# Patient Record
Sex: Female | Born: 1950
Health system: Southern US, Community
[De-identification: ages and names within clinical notes are randomized; demographics above are authoritative.]

## PROBLEM LIST (undated history)

## (undated) DIAGNOSIS — J329 Chronic sinusitis, unspecified: Secondary | ICD-10-CM

## (undated) DIAGNOSIS — N951 Menopausal and female climacteric states: Secondary | ICD-10-CM

## (undated) DIAGNOSIS — G43109 Migraine with aura, not intractable, without status migrainosus: Secondary | ICD-10-CM

## (undated) DIAGNOSIS — Z8489 Family history of other specified conditions: Secondary | ICD-10-CM

## (undated) DIAGNOSIS — Z862 Personal history of diseases of the blood and blood-forming organs and certain disorders involving the immune mechanism: Secondary | ICD-10-CM

## (undated) DIAGNOSIS — Z8742 Personal history of other diseases of the female genital tract: Secondary | ICD-10-CM

## (undated) DIAGNOSIS — Z8701 Personal history of pneumonia (recurrent): Secondary | ICD-10-CM

## (undated) DIAGNOSIS — H6090 Unspecified otitis externa, unspecified ear: Secondary | ICD-10-CM

## (undated) DIAGNOSIS — E871 Hypo-osmolality and hyponatremia: Secondary | ICD-10-CM

## (undated) DIAGNOSIS — U071 COVID-19: Secondary | ICD-10-CM

## (undated) DIAGNOSIS — J320 Chronic maxillary sinusitis: Secondary | ICD-10-CM

## (undated) DIAGNOSIS — N809 Endometriosis, unspecified: Secondary | ICD-10-CM

## (undated) DIAGNOSIS — R001 Bradycardia, unspecified: Secondary | ICD-10-CM

## (undated) DIAGNOSIS — R202 Paresthesia of skin: Secondary | ICD-10-CM

## (undated) DIAGNOSIS — H9192 Unspecified hearing loss, left ear: Secondary | ICD-10-CM

## (undated) DIAGNOSIS — J302 Other seasonal allergic rhinitis: Secondary | ICD-10-CM

## (undated) DIAGNOSIS — I10 Essential (primary) hypertension: Secondary | ICD-10-CM

## (undated) DIAGNOSIS — Z889 Allergy status to unspecified drugs, medicaments and biological substances status: Secondary | ICD-10-CM

## (undated) DIAGNOSIS — R519 Headache, unspecified: Secondary | ICD-10-CM

## (undated) DIAGNOSIS — M8580 Other specified disorders of bone density and structure, unspecified site: Secondary | ICD-10-CM

## (undated) DIAGNOSIS — M1990 Unspecified osteoarthritis, unspecified site: Secondary | ICD-10-CM

## (undated) DIAGNOSIS — R55 Syncope and collapse: Secondary | ICD-10-CM

## (undated) DIAGNOSIS — Z8669 Personal history of other diseases of the nervous system and sense organs: Secondary | ICD-10-CM

## (undated) HISTORY — DX: Personal history of other diseases of the nervous system and sense organs: Z86.69

## (undated) HISTORY — DX: Unspecified otitis externa, unspecified ear: H60.90

## (undated) HISTORY — DX: Hypo-osmolality and hyponatremia: E87.1

## (undated) HISTORY — DX: COVID-19: U07.1

## (undated) HISTORY — DX: Personal history of other diseases of the female genital tract: Z87.42

## (undated) HISTORY — PX: APPENDECTOMY: SHX54

## (undated) HISTORY — DX: Bradycardia, unspecified: R00.1

## (undated) HISTORY — DX: Personal history of pneumonia (recurrent): Z87.01

## (undated) HISTORY — DX: Menopausal and female climacteric states: N95.1

## (undated) HISTORY — DX: Chronic maxillary sinusitis: J32.0

## (undated) HISTORY — DX: Syncope and collapse: R55

## (undated) HISTORY — PX: WISDOM TOOTH EXTRACTION: SHX21

## (undated) HISTORY — DX: Unspecified hearing loss, left ear: H91.92

## (undated) HISTORY — DX: Personal history of diseases of the blood and blood-forming organs and certain disorders involving the immune mechanism: Z86.2

## (undated) HISTORY — DX: Paresthesia of skin: R20.2

## (undated) HISTORY — DX: Migraine with aura, not intractable, without status migrainosus: G43.109

## (undated) HISTORY — DX: Other seasonal allergic rhinitis: J30.2

## (undated) HISTORY — DX: Other specified disorders of bone density and structure, unspecified site: M85.80

## (undated) HISTORY — DX: Endometriosis, unspecified: N80.9

---

## 1981-02-20 DIAGNOSIS — Z8701 Personal history of pneumonia (recurrent): Secondary | ICD-10-CM

## 1981-02-20 HISTORY — DX: Personal history of pneumonia (recurrent): Z87.01

## 1996-02-21 HISTORY — PX: TOTAL ABDOMINAL HYSTERECTOMY: SHX209

## 1997-08-18 ENCOUNTER — Ambulatory Visit (HOSPITAL_COMMUNITY): Admission: RE | Admit: 1997-08-18 | Discharge: 1997-08-18 | Payer: Self-pay | Admitting: Obstetrics and Gynecology

## 1997-09-28 ENCOUNTER — Other Ambulatory Visit: Admission: RE | Admit: 1997-09-28 | Discharge: 1997-09-28 | Payer: Self-pay | Admitting: Obstetrics and Gynecology

## 1998-09-08 ENCOUNTER — Encounter: Payer: Self-pay | Admitting: Obstetrics and Gynecology

## 1998-09-08 ENCOUNTER — Ambulatory Visit (HOSPITAL_COMMUNITY): Admission: RE | Admit: 1998-09-08 | Discharge: 1998-09-08 | Payer: Self-pay | Admitting: Obstetrics and Gynecology

## 1998-09-30 ENCOUNTER — Other Ambulatory Visit: Admission: RE | Admit: 1998-09-30 | Discharge: 1998-09-30 | Payer: Self-pay | Admitting: Obstetrics and Gynecology

## 1999-09-09 ENCOUNTER — Encounter: Payer: Self-pay | Admitting: Obstetrics and Gynecology

## 1999-09-09 ENCOUNTER — Ambulatory Visit (HOSPITAL_COMMUNITY): Admission: RE | Admit: 1999-09-09 | Discharge: 1999-09-09 | Payer: Self-pay | Admitting: Obstetrics and Gynecology

## 1999-10-10 ENCOUNTER — Other Ambulatory Visit: Admission: RE | Admit: 1999-10-10 | Discharge: 1999-10-10 | Payer: Self-pay | Admitting: Obstetrics and Gynecology

## 2000-02-21 HISTORY — PX: NASAL SINUS SURGERY: SHX719

## 2000-03-02 ENCOUNTER — Encounter (INDEPENDENT_AMBULATORY_CARE_PROVIDER_SITE_OTHER): Payer: Self-pay | Admitting: Specialist

## 2000-03-02 ENCOUNTER — Ambulatory Visit (HOSPITAL_BASED_OUTPATIENT_CLINIC_OR_DEPARTMENT_OTHER): Admission: RE | Admit: 2000-03-02 | Discharge: 2000-03-02 | Payer: Self-pay | Admitting: Otolaryngology

## 2000-09-13 ENCOUNTER — Ambulatory Visit (HOSPITAL_COMMUNITY): Admission: RE | Admit: 2000-09-13 | Discharge: 2000-09-13 | Payer: Self-pay | Admitting: Obstetrics and Gynecology

## 2000-09-13 ENCOUNTER — Encounter: Payer: Self-pay | Admitting: Obstetrics and Gynecology

## 2000-10-09 ENCOUNTER — Encounter (INDEPENDENT_AMBULATORY_CARE_PROVIDER_SITE_OTHER): Payer: Self-pay | Admitting: *Deleted

## 2000-10-09 ENCOUNTER — Other Ambulatory Visit: Admission: RE | Admit: 2000-10-09 | Discharge: 2000-10-09 | Payer: Self-pay | Admitting: Otolaryngology

## 2000-11-05 ENCOUNTER — Other Ambulatory Visit: Admission: RE | Admit: 2000-11-05 | Discharge: 2000-11-05 | Payer: Self-pay | Admitting: Obstetrics and Gynecology

## 2001-09-16 ENCOUNTER — Encounter: Payer: Self-pay | Admitting: Emergency Medicine

## 2001-09-16 ENCOUNTER — Observation Stay (HOSPITAL_COMMUNITY): Admission: EM | Admit: 2001-09-16 | Discharge: 2001-09-16 | Payer: Self-pay | Admitting: Emergency Medicine

## 2001-09-18 ENCOUNTER — Ambulatory Visit (HOSPITAL_COMMUNITY): Admission: RE | Admit: 2001-09-18 | Discharge: 2001-09-18 | Payer: Self-pay | Admitting: Obstetrics and Gynecology

## 2001-09-18 ENCOUNTER — Encounter: Payer: Self-pay | Admitting: Obstetrics and Gynecology

## 2002-09-30 ENCOUNTER — Ambulatory Visit (HOSPITAL_COMMUNITY): Admission: RE | Admit: 2002-09-30 | Discharge: 2002-09-30 | Payer: Self-pay | Admitting: Obstetrics and Gynecology

## 2002-09-30 ENCOUNTER — Encounter: Payer: Self-pay | Admitting: Obstetrics and Gynecology

## 2002-12-09 ENCOUNTER — Ambulatory Visit (HOSPITAL_COMMUNITY): Admission: RE | Admit: 2002-12-09 | Discharge: 2002-12-09 | Payer: Self-pay | Admitting: Neurology

## 2003-10-02 ENCOUNTER — Ambulatory Visit (HOSPITAL_COMMUNITY): Admission: RE | Admit: 2003-10-02 | Discharge: 2003-10-02 | Payer: Self-pay | Admitting: Obstetrics and Gynecology

## 2004-10-06 ENCOUNTER — Ambulatory Visit (HOSPITAL_COMMUNITY): Admission: RE | Admit: 2004-10-06 | Discharge: 2004-10-06 | Payer: Self-pay | Admitting: Obstetrics and Gynecology

## 2005-10-09 ENCOUNTER — Ambulatory Visit (HOSPITAL_COMMUNITY): Admission: RE | Admit: 2005-10-09 | Discharge: 2005-10-09 | Payer: Self-pay | Admitting: Obstetrics & Gynecology

## 2005-12-01 ENCOUNTER — Other Ambulatory Visit: Admission: RE | Admit: 2005-12-01 | Discharge: 2005-12-01 | Payer: Self-pay | Admitting: Obstetrics & Gynecology

## 2006-10-19 ENCOUNTER — Ambulatory Visit (HOSPITAL_COMMUNITY): Admission: RE | Admit: 2006-10-19 | Discharge: 2006-10-19 | Payer: Self-pay | Admitting: Obstetrics & Gynecology

## 2006-10-23 ENCOUNTER — Ambulatory Visit (HOSPITAL_COMMUNITY): Admission: RE | Admit: 2006-10-23 | Discharge: 2006-10-23 | Payer: Self-pay | Admitting: Obstetrics & Gynecology

## 2006-12-03 ENCOUNTER — Other Ambulatory Visit: Admission: RE | Admit: 2006-12-03 | Discharge: 2006-12-03 | Payer: Self-pay | Admitting: Obstetrics & Gynecology

## 2007-08-13 ENCOUNTER — Encounter: Admission: RE | Admit: 2007-08-13 | Discharge: 2007-08-13 | Payer: Self-pay | Admitting: Otolaryngology

## 2007-10-24 ENCOUNTER — Ambulatory Visit (HOSPITAL_COMMUNITY): Admission: RE | Admit: 2007-10-24 | Discharge: 2007-10-24 | Payer: Self-pay | Admitting: Obstetrics & Gynecology

## 2008-10-27 ENCOUNTER — Ambulatory Visit (HOSPITAL_COMMUNITY): Admission: RE | Admit: 2008-10-27 | Discharge: 2008-10-27 | Payer: Self-pay | Admitting: Obstetrics & Gynecology

## 2009-03-18 ENCOUNTER — Encounter: Admission: RE | Admit: 2009-03-18 | Discharge: 2009-03-18 | Payer: Self-pay | Admitting: Family Medicine

## 2009-10-28 ENCOUNTER — Ambulatory Visit (HOSPITAL_COMMUNITY): Admission: RE | Admit: 2009-10-28 | Discharge: 2009-10-28 | Payer: Self-pay | Admitting: Obstetrics & Gynecology

## 2010-03-21 ENCOUNTER — Other Ambulatory Visit: Payer: Self-pay | Admitting: Dermatology

## 2010-05-10 ENCOUNTER — Other Ambulatory Visit: Payer: Self-pay | Admitting: Specialist

## 2010-05-10 DIAGNOSIS — M545 Low back pain, unspecified: Secondary | ICD-10-CM

## 2010-05-13 ENCOUNTER — Ambulatory Visit
Admission: RE | Admit: 2010-05-13 | Discharge: 2010-05-13 | Disposition: A | Discharge status: Home / Self Care | Payer: BC Managed Care – PPO | Source: Ambulatory Visit | Attending: Specialist | Admitting: Specialist

## 2010-05-13 DIAGNOSIS — M545 Low back pain, unspecified: Secondary | ICD-10-CM

## 2010-07-08 NOTE — H&P (Signed)
Vicki Turner, Vicki Turner                           ACCOUNT NO.:  [id number]   MEDICAL RECORD NO.:  [id number]                   PATIENT TYPE:  OBV   LOCATION:  0343                                 FACILITY:  [facility]   PHYSICIAN:  Vikki Ports, M.D.            DATE OF BIRTH:  [birth year]   DATE OF ADMISSION:  09/15/2001  DATE OF DISCHARGE:  09/16/2001                                HISTORY & PHYSICAL   CHIEF COMPLAINT:  Altered mental status.   HISTORY OF PRESENT ILLNESS:  This is by the family since the patient does  not recall the event. This is a 60 year old female who had just finished  dinner at restaurant this evening with her family and felt ill on going to  the car to head home. At the car, she suddenly, became very pale, and would  not respond to her family. This lasted for about 10 seconds. They said that  they were talking at her, looking at her right in her face, and yet she did  not respond for about 10 seconds. She simply stared at them. This resolved  and then she became very weak and needed to be supported by her family or  she would have fallen to the ground. They took her to the emergency  department for evaluation because they were concerned and on the way there,  she vomited. She does not recall the staring  event and now by the time I am  called to see her, which is approximately 3-1/2 hours later, she simply  feels very tired but she denies any pain during this episode. She had no  loss of bowel or bladder dysfunction. She just feels somewhat weak and  tired, but she says it is past her bedtime. She has no other associated  symptoms.   SOCIAL HISTORY:  Occupation: She is an administration. Tobacco: None.  Ethanol is about one drink per day.   MEDICATIONS:  Nadolol, she believes it to be 20 mg a day and that is for  migraine prophylaxis. She is also on Premarin 0.625 mg a day, and Imitrex as  needed for migraine headaches.   PAST MEDICAL HISTORY:  1. She gets  migraines.  2. History of sinus surgery.  3. History of hysterectomy for something she thinks to be endometriosis.   FAMILY HISTORY:  Father died of an ulcer, mother died of bronchiectasis.   ALLERGIES:  None.   REVIEW OF SYSTEMS:  1. She wears contact lenses.  2.  Her typical blood pressure is about     110/80, she does not know what her typical pulse is. All other systems     are negative.   PHYSICAL EXAMINATION:  GENERAL:  A well-developed, well-nourished female in  no acute distress, alert and oriented x3.  VITAL SIGNS:  Pulse 60, respirations 16, temperature 96, blood pressure  95/61.  HEENT:  Head is [place]/AT. Eyes: PERRLA,  EOMI, lids are normal. Fundi were not  able to visualized. Ears: Canals and TMs are all normal. Nose and septum  normal. Mouth is normal.  NECK:  No JVD, no bruit.  LUNGS:  Clear.  CARDIOVASCULAR:  Regular rate without murmur.  ABDOMEN:  Soft without mass or tenderness.  EXTREMITIES:  Showed full range of motion without edema.  NEUROLOGIC:  Cranial nerves II-XII, DTR, motor, sensory, and cerebellar  examinations are normal.   LABORATORY AND ACCESSORY DATA:  EEG shows a sinus rhythm, rate of 51. CT of  head was read as negative.   CMET shows potassium of 4.1, sodium 138, glucose 76, BU 19, creatinine 0.9.  Calcium 9.1, ALT 14. Rest of CMET is on the chart. CBC shows a white count  of 8.4, hematocrit 38.9, platelets are 331. This breaks down to 40% lymphs,  49% neutrophil, 8% monocytes. Cardiac panel shows a CK total of 74, MB  fraction 0.8, troponin I is less than 0.01. Urine is 1.005. Specific gravity  pH 5.5, negative dipstick. PT/PTT is 12.6/26 with an INR 0.9.   ASSESSMENT AND PLAN:  Altered level of consciousness for a transient  episode. Doubt this is cardiac, but will admit and monitor. Consider a  neurological consult in the morning, but will leave this up to the  hospitalist after they see and review the chart and evaluate the patient.  Will  hold the Natalol at present. The patient believes her dose is 20 mg per  day but would confirm this with the patient's family prior to restarting.  Further disposition pending patient's course and the result of her  consultations while in the hospital.                                               Vikki Ports, M.D.    TEK/MEDQ  D:  09/16/2001  T:  09/18/2001  Job:  [id number]

## 2010-07-08 NOTE — Op Note (Signed)
[place]  Patient:    ELIYANA, PAGLIARO                        MRN: [id number] Proc. Date: 03/02/00 Adm. Date:  [id number] Attending:  Lucky Cowboy CC:         [facility], Nose and Throat  Dellis Anes. Idell Pickles, M.D.   Operative Report  PREOPERATIVE DIAGNOSIS:  Septal deviation with right-sided nasal obstruction, right inferior turbinate hypertrophy, chronic right maxillary, anterior ethmoid and frontal sinusitis.  PROCEDURE:  Septoplasty, right inferior turbinate reduction, right maxillary antrotomy, right anterior ethmoidectomy, right frontal recess exploration, InstaTrak guidance.  SURGEON:  Lucky Cowboy, M.D.  ANESTHESIA:  General endotracheal anesthesia.  ESTIMATED BLOOD LOSS:  40 cc.  SPECIMENS:  Ethmoid and maxillary sinus contents.  COMPLICATIONS:  None.  INDICATIONS:  This patient is a 60 year old female with a one year history of recurrent right-sided sinusitis.  She has been treated with approximately seven courses of antibiotic therapy, including prednisone.  There has been no improvement on a CT scan, which revealed complete opacification of the right maxillary sinus, right anterior ethmoid cells and the right frontal recess region.  For this reason, the above procedures were performed.  FINDINGS:  The patient was noted to have a right, mid and high septal deviation.  This was causing severe lateralization of the right middle turbinate.  There was severe mucosal edema in this region.  There was right-sided nasal obstruction, both related to the inferior turbinate as well as the septal deviation.  There was severe edema around the maxillary antrum at the natural ostia site.  There was pus in the right maxillary sinus.  There was severe edema of the mucosa in the anterior ethmoid cells.  The frontal recess was left patent and undisturbed after clearing cells anterior and inferior to this.  DESCRIPTION OF PROCEDURE:  The patient  was taken to the operating room and placed on the table in the supine position.  She was then placed under general endotracheal anesthesia and the table rotated counterclockwise approximately 125 degrees.  Instatrak guidance headgear was placed.  The face was prepped with Betadine and draped in the usual sterile fashion.  Each nasal cavity was decongested with Afrin on cottonoid sponges. At this point, the Cathren Harsh was calibrated.  The 0 degree Storz-Hopkins endoscope was then used to visualize the right and left nasal cavities.  The right nasal cavity revealed a mid and high primarily cartilaginous septal deviation.  There was a right-sided bony deviation as well.  This was causing lateralization of the middle turbinate with obstruction of the middle meatus.  For this reason, septoplasty was performed.  One percent lidocaine with 1:100,000 epinephrine was then used to inject the septum and nasal floors, bilaterally.  A left hemitransfixion incision was made using a #15-blade.  Mucoperichondrial flaps and mucoperiosteal flaps were elevated on the left side and the bony cartilaginous junction divided.  Approximately 1 cm of the posterior portion of the quadrangular cartilage was taken down using Therapist, nutritional.  A contralateral flap was then elevated.  Open Jansen-Middleton forceps were used to transect the septum high.  A strut was left approximately 1.5 cm along the dorsal surface.  A large anterior strut was also left for nasal support.  The posterior portion of the quadrangular cartilage and anterior portion of the bone were in perpendicular plate of the ethmoid was thus taken down.  The septum was then allowed to be  medialized.  Attention was turned to the sinus portion of the procedure.  The middle turbinate was fractured medially.  Lidocaine 1% with 1:100,000 epinephrine was then used to inject the right middle uncinate process and middle turbinate. Pediatric backbiting forceps were  then used to take down the mid portion of the uncinate process, which was then taken down inferiorly.  The natural ostia was identified and enlarged using the microdebrider on an oscillating mode at 1500 rpm.  Tru-Cut forceps were also used to open up the natural ostia.  Pus was suctioned out.  At this point, the uncinate process was also taken down superiorly using the microdebrider.  The Instatrak guidance was then used to confirm position.  The ethmoid bullae was entered.  The lateral portion was very hard.  The medial portion was taken down using the microdebrider.  A small portion of the ground lamella was taken down posteriorly to ensure complete clearance of all anterior ethmoid air cells and a small portion of the posterior portion removed.  After identifying this location posteriorly, the cells were taken down superiorly in an anterior direction.  The agger nasi cells were then taken down, which opened up the region of the frontal recess. There was severe edema in this area.  However, the frontal recess was not edematous.  At this point, Gelfilm was rolled and placed into the right ethmoid cavity and Bactroban ointment placed in the right maxillary sinus.  Inferior turbinate was then reduced by injecting with 1% lidocaine with 1:100,000 of epinephrine.  It was then medialized.  The microdebrider was used to reduce redundant mucosa off the inferior portion of the turbinate.  Sinus scissors were then used to resect bone.  Suction/cautery was used for hemostasis.  The left hemitransfixion incision was closed in a simple interrupted fashion using 4-0 chromic.  Doyle splints were placed on either side of the septum and secured in a horizontal mattress stitch using a 2-0 silk suture.  Merocel packs were placed in each nasal cavity, which had been coated with Bactroban ointment.  They were tied anterior to the columella to one another.  Oral cavity was then suctioned.  Table was then  rotated 125 degrees to its original  position.  The patient was awakened from anesthesia and extubated in the operating room.  She was taken to the post anesthesia care unit in stable condition.  There were no complications. DD:  03/02/00 TD:  03/02/00 Job: [id number]

## 2010-10-05 ENCOUNTER — Other Ambulatory Visit: Payer: Self-pay | Admitting: Obstetrics & Gynecology

## 2010-10-05 DIAGNOSIS — M8580 Other specified disorders of bone density and structure, unspecified site: Secondary | ICD-10-CM

## 2010-10-05 DIAGNOSIS — Z1231 Encounter for screening mammogram for malignant neoplasm of breast: Secondary | ICD-10-CM

## 2010-11-01 ENCOUNTER — Ambulatory Visit (HOSPITAL_COMMUNITY)
Admission: RE | Admit: 2010-11-01 | Discharge: 2010-11-01 | Disposition: A | Discharge status: Home / Self Care | Payer: BC Managed Care – PPO | Source: Ambulatory Visit | Attending: Obstetrics & Gynecology | Admitting: Obstetrics & Gynecology

## 2010-11-01 DIAGNOSIS — Z1231 Encounter for screening mammogram for malignant neoplasm of breast: Secondary | ICD-10-CM | POA: Insufficient documentation

## 2010-11-01 DIAGNOSIS — M8580 Other specified disorders of bone density and structure, unspecified site: Secondary | ICD-10-CM

## 2011-10-06 ENCOUNTER — Other Ambulatory Visit: Payer: Self-pay | Admitting: Obstetrics & Gynecology

## 2011-10-06 DIAGNOSIS — Z1231 Encounter for screening mammogram for malignant neoplasm of breast: Secondary | ICD-10-CM

## 2011-11-06 ENCOUNTER — Ambulatory Visit (HOSPITAL_COMMUNITY)
Admission: RE | Admit: 2011-11-06 | Discharge: 2011-11-06 | Disposition: A | Discharge status: Home / Self Care | Payer: BC Managed Care – PPO | Source: Ambulatory Visit | Attending: Obstetrics & Gynecology | Admitting: Obstetrics & Gynecology

## 2011-11-06 DIAGNOSIS — Z1231 Encounter for screening mammogram for malignant neoplasm of breast: Secondary | ICD-10-CM | POA: Insufficient documentation

## 2012-05-06 ENCOUNTER — Encounter: Payer: Self-pay | Admitting: Obstetrics & Gynecology

## 2012-05-09 ENCOUNTER — Other Ambulatory Visit: Payer: Self-pay | Admitting: Dermatology

## 2012-05-21 ENCOUNTER — Encounter: Payer: Self-pay | Admitting: Obstetrics & Gynecology

## 2012-05-21 ENCOUNTER — Ambulatory Visit (INDEPENDENT_AMBULATORY_CARE_PROVIDER_SITE_OTHER): Payer: BC Managed Care – PPO | Admitting: Obstetrics & Gynecology

## 2012-05-21 VITALS — BP 128/82 | Ht 66.0 in | Wt 159.0 lb

## 2012-05-21 DIAGNOSIS — Z0000 Encounter for general adult medical examination without abnormal findings: Secondary | ICD-10-CM

## 2012-05-21 DIAGNOSIS — Z01419 Encounter for gynecological examination (general) (routine) without abnormal findings: Secondary | ICD-10-CM

## 2012-05-21 LAB — POCT URINALYSIS DIPSTICK
Blood, UA: NEGATIVE
Glucose, UA: NEGATIVE
pH, UA: 5

## 2012-05-21 MED ORDER — NADOLOL 20 MG PO TABS: 20.0000 mg | ORAL_TABLET | Freq: Every day | ORAL | Status: DC

## 2012-05-21 MED ORDER — ESTROGENS CONJUGATED 0.625 MG PO TABS: 0.6250 mg | ORAL_TABLET | Freq: Every day | ORAL | Status: DC

## 2012-05-21 NOTE — Progress Notes (Signed)
62 y.o. G2P2 MarriedCaucasianF here for annual exam.  Doing really well.  Has a new three week old granddaughter--Anna Windell Moulding.  Oldest daughter is expecting another child--son in July.  No vaginal bleeding.  D/W patient 3D MMG.   No LMP recorded. Patient has had a hysterectomy.          Sexually active: yes  The current method of family planning is status post hysterectomy.    Exercising: no  doing yardwork Smoker:  no  Health Maintenance: Pap:  12/03/06 WNL MMG:  11/06/11 normal Colonoscopy:  11/08 repeat in 10 years BMD:   11/01/10 stable, worst T score -1.5 in hip TDaP:  2008 Labs: 2013 here with me--all normal   reports that she has never smoked. She does not have any smokeless tobacco history on file. She reports that she drinks about 2.0 ounces of alcohol per week. She reports that she does not use illicit drugs.  Past Medical History  Diagnosis Date  . Migraine   . Osteopenia   . History of endometriosis   . History of anemia     Past Surgical History  Procedure Laterality Date  . Total abdominal hysterectomy  1998    DUB/endometriosis  . Nasal sinus surgery  2002  . Nasal septum surgery      Current Outpatient Prescriptions  Medication Sig Dispense Refill  . Calcipotriene 0.005 % solution       . estrogens, conjugated, (PREMARIN) 0.9 MG tablet Take 0.9 mg by mouth daily. Take daily for 21 days then do not take for 7 days.      . fluticasone (FLONASE) 50 MCG/ACT nasal spray       . loratadine (CLARITIN) 10 MG tablet Take 10 mg by mouth daily.      . Multiple Vitamin (MULTIVITAMIN) capsule Take 1 capsule by mouth daily.      . nadolol (CORGARD) 20 MG tablet Take 20 mg by mouth daily.      . SUMAtriptan (IMITREX) 100 MG tablet Take 100 mg by mouth every 2 (two) hours as needed for migraine.       No current facility-administered medications for this visit.    Family History  Problem Relation Age of Onset  . Hypertension Maternal Grandfather   . Heart attack Father    . Miscarriages / Stillbirths Sister   . Transient ischemic attack Maternal Grandmother   . Osteoarthritis Mother     ROS:  Pertinent items are noted in HPI.  Otherwise, a comprehensive ROS was negative.  Exam:   BP 128/82  Ht 5\' 6"  (1.676 m)  Wt 159 lb (72.122 kg)  BMI 25.68 kg/m2  Height:   Height: 5\' 6"  (167.6 cm)  Ht Readings from Last 3 Encounters:  05/21/12 5\' 6"  (1.676 m)    General appearance: alert, cooperative and appears stated age Head: Normocephalic, without obvious abnormality, atraumatic Neck: no adenopathy, supple, symmetrical, trachea midline and thyroid normal to inspection and palpation, no tenderness/mass/nodules Lungs: clear to auscultation bilaterally Breasts: normal appearance, no masses or tenderness, No nipple retraction or dimpling Heart: regular rate and rhythm Abdomen: soft, non-tender; bowel sounds normal; no masses,  no organomegaly Extremities: extremities normal, atraumatic, no cyanosis or edema Skin: Skin color, texture, turgor normal. No rashes or lesions Lymph nodes: Cervical, supraclavicular, and axillary nodes normal. No abnormal inguinal nodes palpated Neurologic: Grossly normal   Pelvic: External genitalia:  no lesions              Urethra:  normal  appearing urethra with no masses, tenderness or lesions              Bartholins and Skenes: normal                 Vagina: normal appearing vagina with normal color and discharge, no lesions              Cervix: absent              Pap taken: no Bimanual Exam:  Uterus:  uterus absent              Adnexa: no masses or fullness noted               Rectovaginal: Confirms               Anus:  normal sphincter tone, no lesions  A:  Well Woman with normal exam Migraines S/P TAH due to endometriosis  P:   mammogram counseled on menopause.  Will decrease her HRT to premarin 0.625mg  qd.  Rx for one year given Rx for nadolol for one year for migraine prophylaxis return annually or prn  An  After Visit Summary was printed and given to the patient.

## 2012-05-21 NOTE — Patient Instructions (Signed)

## 2012-06-14 ENCOUNTER — Other Ambulatory Visit: Payer: Self-pay | Admitting: *Deleted

## 2012-06-14 ENCOUNTER — Telehealth: Payer: Self-pay | Admitting: *Deleted

## 2012-06-14 NOTE — Telephone Encounter (Signed)
Patient had Aex on 05/21/12 and is scheduled for 08/04/13 request faxed for #90 Premarin 0.9 mg [facility] [address]

## 2012-06-14 NOTE — Telephone Encounter (Signed)
Ok to refill 

## 2012-10-16 ENCOUNTER — Other Ambulatory Visit: Payer: Self-pay | Admitting: Obstetrics & Gynecology

## 2012-10-16 DIAGNOSIS — Z1231 Encounter for screening mammogram for malignant neoplasm of breast: Secondary | ICD-10-CM

## 2012-11-06 ENCOUNTER — Ambulatory Visit (HOSPITAL_COMMUNITY)
Admission: RE | Admit: 2012-11-06 | Discharge: 2012-11-06 | Disposition: A | Discharge status: Home / Self Care | Payer: BC Managed Care – PPO | Source: Ambulatory Visit | Attending: Obstetrics & Gynecology | Admitting: Obstetrics & Gynecology

## 2012-11-06 DIAGNOSIS — Z1231 Encounter for screening mammogram for malignant neoplasm of breast: Secondary | ICD-10-CM | POA: Insufficient documentation

## 2013-06-30 ENCOUNTER — Other Ambulatory Visit: Payer: Self-pay | Admitting: Obstetrics & Gynecology

## 2013-06-30 NOTE — Telephone Encounter (Signed)
Last AEX and refill 05/21/12 #90/ 4 refills Next appt 07/29/13  Will refill once until appt 07/2013.

## 2013-07-29 ENCOUNTER — Ambulatory Visit: Payer: BC Managed Care – PPO | Admitting: Obstetrics & Gynecology

## 2013-07-29 ENCOUNTER — Telehealth: Payer: Self-pay | Admitting: Obstetrics & Gynecology

## 2013-07-29 NOTE — Telephone Encounter (Signed)
LMTCB re: dr cx/rs to 09/02/13 with Dr. Hyacinth Meeker.

## 2013-07-29 NOTE — Telephone Encounter (Signed)
Scheduled

## 2013-08-03 ENCOUNTER — Other Ambulatory Visit: Payer: Self-pay | Admitting: Obstetrics & Gynecology

## 2013-08-04 NOTE — Telephone Encounter (Signed)
Last AEX and refill 05/21/12 #90/4 refills Next appt 09/02/13 Last MMG 11/06/12 [id number]  Rx sent for 1 month until appt 08/2013

## 2013-08-25 ENCOUNTER — Ambulatory Visit: Payer: BC Managed Care – PPO | Admitting: Obstetrics & Gynecology

## 2013-09-02 ENCOUNTER — Encounter: Payer: Self-pay | Admitting: Obstetrics & Gynecology

## 2013-09-02 ENCOUNTER — Other Ambulatory Visit: Payer: Self-pay | Admitting: Obstetrics & Gynecology

## 2013-09-02 ENCOUNTER — Ambulatory Visit (INDEPENDENT_AMBULATORY_CARE_PROVIDER_SITE_OTHER): Payer: BC Managed Care – PPO | Admitting: Obstetrics & Gynecology

## 2013-09-02 VITALS — BP 118/80 | HR 68 | Resp 20 | Ht 65.5 in | Wt 159.8 lb

## 2013-09-02 DIAGNOSIS — Z8742 Personal history of other diseases of the female genital tract: Secondary | ICD-10-CM

## 2013-09-02 DIAGNOSIS — G43909 Migraine, unspecified, not intractable, without status migrainosus: Secondary | ICD-10-CM | POA: Insufficient documentation

## 2013-09-02 DIAGNOSIS — Z01419 Encounter for gynecological examination (general) (routine) without abnormal findings: Secondary | ICD-10-CM

## 2013-09-02 DIAGNOSIS — Z0000 Encounter for general adult medical examination without abnormal findings: Secondary | ICD-10-CM

## 2013-09-02 LAB — POCT URINALYSIS DIPSTICK
Bilirubin, UA: NEGATIVE
Glucose, UA: NEGATIVE
KETONES UA: NEGATIVE
Leukocytes, UA: NEGATIVE
Nitrite, UA: NEGATIVE
Protein, UA: NEGATIVE
RBC UA: NEGATIVE
UROBILINOGEN UA: NEGATIVE
pH, UA: 5

## 2013-09-02 LAB — HEMOGLOBIN, FINGERSTICK: Hemoglobin, fingerstick: 12.7 g/dL (ref 12.0–16.0)

## 2013-09-02 MED ORDER — SUMATRIPTAN SUCCINATE 100 MG PO TABS: 100.0000 mg | ORAL_TABLET | ORAL | Status: AC | PRN

## 2013-09-02 MED ORDER — NADOLOL 20 MG PO TABS: ORAL_TABLET | ORAL | Status: DC

## 2013-09-02 MED ORDER — ESTROGENS CONJUGATED 0.45 MG PO TABS: 0.4500 mg | ORAL_TABLET | Freq: Every day | ORAL | Status: DC

## 2013-09-02 NOTE — Progress Notes (Signed)
63 y.o. G2P2 MarriedCaucasianF here for annual exam.  Just got back from [place] trip with 4 other couples from church.  Did a week long cruise.  [place] to [place] and cruised for a week.    Patient's last menstrual period was 02/20/1986.          Sexually active: Yes.    The current method of family planning is status post hysterectomy.    Exercising: No.  not regularly Smoker:  no  Health Maintenance: Pap:  12/03/06 WNL History of abnormal Pap:  no MMG:  11/06/12 3D-normal Colonoscopy:  11/08-repeat in 10 years, Dr. Loreta AveMann BMD:   9/12, -1.2/-1.5 TDaP:  2008 Screening Labs: 2013, Hb today: 12.7, Urine today: negative   reports that she has never smoked. She has never used smokeless tobacco. She reports that she drinks about 2.5 ounces of alcohol per week. She reports that she does not use illicit drugs.  Past Medical History  Diagnosis Date  . Migraine   . Osteopenia   . History of endometriosis   . History of anemia     Past Surgical History  Procedure Laterality Date  . Total abdominal hysterectomy  1998    DUB/endometriosis  . Nasal sinus surgery  2002  . Nasal septum surgery      Current Outpatient Prescriptions  Medication Sig Dispense Refill  . Calcipotriene 0.005 % solution       . fluticasone (FLONASE) 50 MCG/ACT nasal spray       . loratadine (CLARITIN) 10 MG tablet Take 10 mg by mouth daily.      . Multiple Vitamin (MULTIVITAMIN) capsule Take 1 capsule by mouth daily.      . nadolol (CORGARD) 20 MG tablet TAKE 1 TABLET (20 MG TOTAL) BY MOUTH DAILY.  90 tablet  0  . PREMARIN 0.625 MG tablet TAKE 1 TABLET EVERY DAY  30 tablet  0  . SUMAtriptan (IMITREX) 100 MG tablet Take 100 mg by mouth every 2 (two) hours as needed for migraine.       No current facility-administered medications for this visit.    Family History  Problem Relation Age of Onset  . Hypertension Maternal Grandfather   . Heart attack Father   . Miscarriages / Stillbirths Sister   . Transient  ischemic attack Maternal Grandmother   . Osteoarthritis Mother     ROS:  Pertinent items are noted in HPI.  Otherwise, a comprehensive ROS was negative.  Exam:   BP 118/80  Pulse 68  Resp 20  Ht 5' 5.5" (1.664 m)  Wt 159 lb 12.8 oz (72.485 kg)  BMI 26.18 kg/m2  LMP 02/20/1986  Weight change: stable  Height: 5' 5.5" (166.4 cm)  Ht Readings from Last 3 Encounters:  09/02/13 5' 5.5" (1.664 m)  05/21/12 5\' 6"  (1.676 m)    General appearance: alert, cooperative and appears stated age Head: Normocephalic, without obvious abnormality, atraumatic Neck: no adenopathy, supple, symmetrical, trachea midline and thyroid normal to inspection and palpation Lungs: clear to auscultation bilaterally Breasts: normal appearance, no masses or tenderness Heart: regular rate and rhythm Abdomen: soft, non-tender; bowel sounds normal; no masses,  no organomegaly Extremities: extremities normal, atraumatic, no cyanosis or edema Skin: Skin color, texture, turgor normal. No rashes or lesions Lymph nodes: Cervical, supraclavicular, and axillary nodes normal. No abnormal inguinal nodes palpated Neurologic: Grossly normal   Pelvic: External genitalia:  no lesions              Urethra:  normal appearing  urethra with no masses, tenderness or lesions              Bartholins and Skenes: normal                 Vagina: normal appearing vagina with normal color and discharge, no lesions              Cervix: absent              Pap taken: No. Bimanual Exam:  Uterus:  uterus absent              Adnexa: no mass, fullness, tenderness               Rectovaginal: Confirms               Anus:  normal sphincter tone, no lesions  A:  Well Woman with normal exam  Migraines  S/P TAH due to endometriosis   P: mammogram  counseled on menopause. Will decrease her HRT to premarin 0.45mg  qd. Rx for one year given.  Aware of breast cancer risk.  Will try to wean further this year if possible. Rx for nadolol 20mg  daily  for one year for migraine prophylaxis and for Imitrex 100mg  at headache onset, repeat 2 hrs if needed.  #9/4RF.  Pt reports she usually only needs 1 RF for a year. return annually or prn  An After Visit Summary was printed and given to the patient.

## 2013-10-20 ENCOUNTER — Other Ambulatory Visit: Payer: Self-pay | Admitting: Obstetrics & Gynecology

## 2013-10-20 DIAGNOSIS — Z1231 Encounter for screening mammogram for malignant neoplasm of breast: Secondary | ICD-10-CM

## 2013-11-19 ENCOUNTER — Ambulatory Visit (HOSPITAL_COMMUNITY)
Admission: RE | Admit: 2013-11-19 | Discharge: 2013-11-19 | Disposition: A | Discharge status: Home / Self Care | Payer: BC Managed Care – PPO | Source: Ambulatory Visit | Attending: Obstetrics & Gynecology | Admitting: Obstetrics & Gynecology

## 2013-11-19 DIAGNOSIS — Z1231 Encounter for screening mammogram for malignant neoplasm of breast: Secondary | ICD-10-CM | POA: Diagnosis not present

## 2013-12-05 ENCOUNTER — Other Ambulatory Visit: Payer: Self-pay

## 2013-12-22 ENCOUNTER — Encounter: Payer: Self-pay | Admitting: Obstetrics & Gynecology

## 2013-12-22 ENCOUNTER — Telehealth: Payer: Self-pay | Admitting: Obstetrics & Gynecology

## 2013-12-22 MED ORDER — ESTROGENS CONJUGATED 0.3 MG PO TABS: 0.3000 mg | ORAL_TABLET | Freq: Every day | ORAL | Status: DC

## 2013-12-22 NOTE — Telephone Encounter (Signed)
rx for premarin 0.3mg  daily sent to pharmacy with 2RF's.  If does ok with this dosage and wants to stop HRT, call when ready and I will give additional instructions.  Thanks.

## 2013-12-22 NOTE — Telephone Encounter (Signed)
Patient notified and given message from Dr. Hyacinth MeekerMiller.  Will call back when ready., Will close encounter.

## 2013-12-22 NOTE — Telephone Encounter (Addendum)
Routing patient request to Dr. Hyacinth MeekerMiller.  Last AEX 09/02/13, patient was to wean HRT to 0.45 mg and then try to decrease further over the year.  [facility] [address]  Advised will return her call when new order placed by Dr. Hyacinth MeekerMiller, patient agreeable.

## 2013-12-22 NOTE — Telephone Encounter (Signed)
Patient calling about Premarin .45 and wants to go down to the next lower level.  estrogens, conjugated, (PREMARIN) 0.45 MG tablet  Take 1 tablet (0.45 mg total) by mouth daily., Starting 09/02/2013, Until Discontinued, Normal, Last Dose: Not Recorded  Refills: 4 ordered [facility]: [id number] [place], [place] - [address]

## 2014-02-18 ENCOUNTER — Telehealth: Payer: Self-pay | Admitting: Obstetrics & Gynecology

## 2014-02-18 NOTE — Telephone Encounter (Signed)
Patient calling to follow up with the nurse about reducing her hormones she has been taking.

## 2014-02-18 NOTE — Telephone Encounter (Signed)
Left message to call Kaitlyn at [phone]. 

## 2014-02-18 NOTE — Telephone Encounter (Signed)
Spoke with patient. Advised patient of message as seen below from Dr.Miller. Patient is agreeable.  Routing to provider for final review. Patient agreeable to disposition. Will close encounter ; 

## 2014-02-18 NOTE — Telephone Encounter (Signed)
Spoke with patient. Patient states that she would like to come off her HRT at this time. Patient is currently taking Premarin 0.3mg  daily. "I have done really well. I have not had one hot flash or night sweat since August. I am ready to come off but she told me to check in before I did so she could give me instructions." Advised patient will send a message over to Dr.Miller and return call with further recommendations and instructions. Patient is agreeable.

## 2014-02-18 NOTE — Telephone Encounter (Signed)
Just take every other day for another two to four weeks and then just stop.  She should call with any new side effects.

## 2014-05-04 ENCOUNTER — Telehealth: Payer: Self-pay | Admitting: Obstetrics & Gynecology

## 2014-05-04 NOTE — Telephone Encounter (Signed)
Spoke with patient. Patient states that in January she started taking Premarin 0.3mg  every other day for two weeks and then came off. "For the first month I was doing so well. I did not have any night sweats at all. Over the last six weeks I have been having increases night sweats that are keeping me awake all night. Last night I had 7-8 and I was up at least 80 percent of the night. I really do not want to go back on hormones but I need to do something. Is there something else I could take?" Advised patient will speak with Dr.Miller regarding alternatives and return call to discuss. Patient is agreeable.

## 2014-05-04 NOTE — Telephone Encounter (Signed)
Patient has stopped premarin and ws told to call with update. Patient says "I am miserable".  Last seen 09/02/13.

## 2014-05-06 MED ORDER — PAROXETINE HCL 10 MG PO TABS: 10.0000 mg | ORAL_TABLET | Freq: Every day | ORAL | Status: DC

## 2014-05-06 NOTE — Telephone Encounter (Signed)
Yes.  Can try a low dose SSRI like 10mg  Paxil.  This should help get her over the hump and she should be able to come off in the future.

## 2014-05-06 NOTE — Telephone Encounter (Signed)
Spoke with patient. Advised of message as seen below from Dr.Miller. Patient is agreeable and would like to start Paxil at this time. Paxil 10mg  [facility]. Patient is agreeable. Will return call with any questions of concerns. Has aex scheduled for 09/18/2014 with Dr.Miller.  Routing to provider for final review. Patient agreeable to disposition. Will close encounter

## 2014-07-22 ENCOUNTER — Telehealth: Payer: Self-pay | Admitting: Obstetrics & Gynecology

## 2014-07-22 NOTE — Telephone Encounter (Signed)
Spoke with patient. Patient states that she had her labs and urine tested with her PCP this year. Would like to cancel her lab appointment scheduled for 7/29 and bring in results for Dr.Miller at her aex. Lab appointment for 7/29 cancelled. Aex scheduled for 7/29 at 1:30pm with Dr.Miller. Patient is agreeable.  Routing to provider for final review. Patient agreeable to disposition. Will close encounter.

## 2014-07-22 NOTE — Telephone Encounter (Signed)
Patient states that she had a appointment for her insurance and she wants to know if that can take the place of new physical scheduled 09/18/2014. Patient ok for call back to ask general questions about lab

## 2014-09-09 ENCOUNTER — Encounter: Payer: Self-pay | Admitting: Nurse Practitioner

## 2014-09-09 ENCOUNTER — Ambulatory Visit (INDEPENDENT_AMBULATORY_CARE_PROVIDER_SITE_OTHER): Payer: BC Managed Care – PPO | Admitting: Nurse Practitioner

## 2014-09-09 VITALS — BP 134/76 | HR 56 | Ht 66.0 in | Wt 156.0 lb

## 2014-09-09 DIAGNOSIS — Z0000 Encounter for general adult medical examination without abnormal findings: Secondary | ICD-10-CM | POA: Diagnosis not present

## 2014-09-09 DIAGNOSIS — Z8742 Personal history of other diseases of the female genital tract: Secondary | ICD-10-CM

## 2014-09-09 DIAGNOSIS — E2839 Other primary ovarian failure: Secondary | ICD-10-CM

## 2014-09-09 DIAGNOSIS — Z1211 Encounter for screening for malignant neoplasm of colon: Secondary | ICD-10-CM | POA: Diagnosis not present

## 2014-09-09 DIAGNOSIS — Z01419 Encounter for gynecological examination (general) (routine) without abnormal findings: Secondary | ICD-10-CM

## 2014-09-09 MED ORDER — NADOLOL 20 MG PO TABS: ORAL_TABLET | ORAL | Status: DC

## 2014-09-09 NOTE — Progress Notes (Signed)
Patient ID: Vicki Turner, female   DOB: 12-Oct-1950, 64 y.o.   MRN: [id number] 64 y.o. G2P2 Married  Caucasian Fe here for annual exam.  Last year she was decreasing her ERT from Premarin 0.45 to 0.3 mg  She did well initially but then increase in vaso symptoms.  She then started on Paxil 10 mg daily.  She did get help initially but having an increase in night sweats.  She is willing to go up in medication but does not want to go back on ERT.  Patient's last menstrual period was 02/20/1986.          Sexually active: Yes.    The current method of family planning is status post hysterectomy.    Exercising: No.  The patient does not participate in regular exercise at present. Smoker:  no  Health Maintenance: Pap: 12/03/06 WNL MMG:  11/19/13, 3D, Bi-Rads 1:  Negative  Colonoscopy: 11/08-repeat in 10 years, Dr. Collene Mares BMD: 11/01/10, -1.2 S/-1.4 R/-1.1 L TDaP: 2008 Labs:  Ins Co.  Pt brought copies.   reports that she has never smoked. She has never used smokeless tobacco. She reports that she drinks about 2.5 oz of alcohol per week. She reports that she does not use illicit drugs.  Past Medical History  Diagnosis Date  . Migraine   . Osteopenia   . History of endometriosis   . History of anemia     Past Surgical History  Procedure Laterality Date  . Total abdominal hysterectomy  1998    DUB/endometriosis  . Nasal sinus surgery  2002  . Nasal septum surgery      Current Outpatient Prescriptions  Medication Sig Dispense Refill  . Calcipotriene 0.005 % solution     . diclofenac (VOLTAREN) 75 MG EC tablet Take 1 tablet by mouth 2 (two) times daily.    . fluticasone (FLONASE) 50 MCG/ACT nasal spray     . loratadine (CLARITIN) 10 MG tablet Take 10 mg by mouth daily.    . Multiple Vitamin (MULTIVITAMIN) capsule Take 1 capsule by mouth daily.    . nadolol (CORGARD) 20 MG tablet TAKE 1 TABLET (20 MG TOTAL) BY MOUTH DAILY. 90 tablet 4  . PARoxetine (PAXIL) 10 MG tablet Take 1 tablet (10  mg total) by mouth daily. 30 tablet 4  . SUMAtriptan (IMITREX) 100 MG tablet Take 1 tablet (100 mg total) by mouth every 2 (two) hours as needed for migraine. 9 tablet 4   No current facility-administered medications for this visit.    Family History  Problem Relation Age of Onset  . Hypertension Maternal Grandfather   . Heart attack Father   . Miscarriages / Stillbirths Sister   . Transient ischemic attack Maternal Grandmother   . Osteoarthritis Mother     ROS:  Pertinent items are noted in HPI.  Otherwise, a comprehensive ROS was negative.  Exam:   BP 134/76 mmHg  Pulse 56  Ht 5' 6" (1.676 m)  Wt 156 lb (70.761 kg)  BMI 25.19 kg/m2  LMP 02/20/1986 Height: 5' 6" (167.6 cm) Ht Readings from Last 3 Encounters:  09/09/14 5' 6" (1.676 m)  09/02/13 5' 5.5" (1.664 m)  05/21/12 5' 6" (1.676 m)    General appearance: alert, cooperative and appears stated age Head: Normocephalic, without obvious abnormality, atraumatic Neck: no adenopathy, supple, symmetrical, trachea midline and thyroid normal to inspection and palpation Lungs: clear to auscultation bilaterally Breasts: normal appearance, no masses or tenderness Heart: regular rate and rhythm Abdomen: soft,  non-tender; no masses,  no organomegaly Extremities: extremities normal, atraumatic, no cyanosis or edema Skin: Skin color, texture, turgor normal. No rashes or lesions Lymph nodes: Cervical, supraclavicular, and axillary nodes normal. No abnormal inguinal nodes palpated Neurologic: Grossly normal   Pelvic: External genitalia:  no lesions              Urethra:  normal appearing urethra with no masses, tenderness or lesions              Bartholin's and Skene's: normal                 Vagina: normal appearing vagina with normal color and discharge, no lesions              Cervix: absent              Pap taken: No. Bimanual Exam:  Uterus:  uterus absent              Adnexa: no mass, fullness, tenderness                Rectovaginal: Confirms               Anus:  normal sphincter tone, no lesions  Chaperone present:  no  A:  Well Woman with normal exam  S/P TAH secondary to endometriosis 1998  ERT replacement from about age 54 till 01/2014  History of migraine headaches.    P:   Reviewed health and wellness pertinent to exam  Pap smear as above  Mammogram is due 10/2014  Order placed for Dexa  Will give her IFOB kit  Refill on Corgard for migraine, does not need refill on Imitrex at this time.  Discussed tapering her Paxil up in dose to help with vaso symptoms - she will take 1 1/2 tabs for a week and then go up to 2 tabs a day.  If no relief to call back - most likely will need next RX dose changed to 20 mg daily.  New Rx is not given today as she has a new one just filled.  Counseled on breast self exam, mammography screening, adequate intake of calcium and vitamin D, diet and exercise return annually or prn  An After Visit Summary was printed and given to the patient.

## 2014-09-09 NOTE — Patient Instructions (Signed)

## 2014-09-16 NOTE — Progress Notes (Signed)
Encounter reviewed by Dr. Brook Amundson C. Silva.  

## 2014-09-18 ENCOUNTER — Ambulatory Visit: Payer: BC Managed Care – PPO | Admitting: Obstetrics & Gynecology

## 2014-09-18 ENCOUNTER — Other Ambulatory Visit: Payer: BC Managed Care – PPO

## 2014-09-20 ENCOUNTER — Other Ambulatory Visit: Payer: Self-pay | Admitting: Obstetrics & Gynecology

## 2014-09-21 NOTE — Telephone Encounter (Signed)
Patient is requesting a refill for paroxetine. She is going out of town today at 12 noon. She is using [facility] [address]

## 2014-09-21 NOTE — Telephone Encounter (Signed)
Medication refill request: paxil  Last AEX:  09-09-14 Next AEX: 09-15-15 Last MMG (if hormonal medication request): 11-19-13 category b density, birads 1:neg Refill authorized: at aex you had patient take 1 1/2 tabs for 1 week then go up to 2 tabs a day. Per note, you stated next rx dose will probably need to be changed to  daily. No rx was given day of aex bc patient had just filled the previous rx.  Called & spoke with patient who states she changed the time of day she takes it & has been taking just 1 1/2 tabs daily & feels this is a good dose for her & doesn't think she needs to move to 2 tabs daily unless you feel she needs to. Pt states she leaves for vacation today & noon & will need a refill. Please approve rx

## 2014-09-24 ENCOUNTER — Other Ambulatory Visit: Payer: Self-pay | Admitting: Obstetrics & Gynecology

## 2014-09-24 NOTE — Telephone Encounter (Signed)
Medication refill request: Nadolol  Last AEX:  09/09/14 PG Next AEX: 09/15/15 PG Last MMG (if hormonal medication request): 11/14/13 BIRADS1:neg Refill authorized: 09/09/14 #90tabs w/ 4R to [facility] [address]

## 2014-10-29 ENCOUNTER — Other Ambulatory Visit: Payer: Self-pay | Admitting: Obstetrics & Gynecology

## 2014-10-29 ENCOUNTER — Telehealth: Payer: Self-pay | Admitting: Obstetrics & Gynecology

## 2014-10-29 DIAGNOSIS — M8580 Other specified disorders of bone density and structure, unspecified site: Secondary | ICD-10-CM

## 2014-10-29 NOTE — Telephone Encounter (Signed)
Patient calling [facility] for her bone density.

## 2014-10-29 NOTE — Telephone Encounter (Signed)
Left detailed message at home number provided [phone], okay per ROI. Advised that order for BMD has been [facility] to [facility]. Advised to return call with any further questions or needs.  Routing to provider for final review. Patient agreeable to disposition. Will close encounter.

## 2014-11-04 LAB — FECAL OCCULT BLOOD, IMMUNOCHEMICAL: IMMUNOLOGICAL FECAL OCCULT BLOOD TEST: NEGATIVE

## 2014-11-04 NOTE — Addendum Note (Signed)
Addended by: Luisa Dago on: 11/04/2014 08:52 AM   Modules accepted: Orders

## 2014-11-16 ENCOUNTER — Other Ambulatory Visit: Payer: Self-pay

## 2014-11-16 DIAGNOSIS — Z1231 Encounter for screening mammogram for malignant neoplasm of breast: Secondary | ICD-10-CM

## 2014-12-21 ENCOUNTER — Ambulatory Visit
Admission: RE | Admit: 2014-12-21 | Discharge: 2014-12-21 | Disposition: A | Discharge status: Home / Self Care | Payer: BC Managed Care – PPO | Source: Ambulatory Visit

## 2014-12-21 ENCOUNTER — Ambulatory Visit
Admission: RE | Admit: 2014-12-21 | Discharge: 2014-12-21 | Disposition: A | Discharge status: Home / Self Care | Payer: BC Managed Care – PPO | Source: Ambulatory Visit | Attending: Nurse Practitioner | Admitting: Nurse Practitioner

## 2014-12-21 DIAGNOSIS — Z1231 Encounter for screening mammogram for malignant neoplasm of breast: Secondary | ICD-10-CM

## 2014-12-21 DIAGNOSIS — M8580 Other specified disorders of bone density and structure, unspecified site: Secondary | ICD-10-CM

## 2015-01-28 ENCOUNTER — Ambulatory Visit
Admission: RE | Admit: 2015-01-28 | Discharge: 2015-01-28 | Disposition: A | Discharge status: Home / Self Care | Payer: BC Managed Care – PPO | Source: Ambulatory Visit

## 2015-01-28 ENCOUNTER — Other Ambulatory Visit: Payer: Self-pay

## 2015-06-16 ENCOUNTER — Encounter: Payer: Self-pay | Admitting: Obstetrics and Gynecology

## 2015-06-16 ENCOUNTER — Ambulatory Visit (INDEPENDENT_AMBULATORY_CARE_PROVIDER_SITE_OTHER): Payer: Medicare Other | Admitting: Obstetrics and Gynecology

## 2015-06-16 VITALS — BP 136/84 | HR 64 | Ht 66.0 in | Wt 151.0 lb

## 2015-06-16 DIAGNOSIS — N8110 Cystocele, unspecified: Secondary | ICD-10-CM

## 2015-06-16 DIAGNOSIS — N816 Rectocele: Secondary | ICD-10-CM | POA: Diagnosis not present

## 2015-06-16 DIAGNOSIS — N393 Stress incontinence (female) (male): Secondary | ICD-10-CM | POA: Diagnosis not present

## 2015-06-16 DIAGNOSIS — IMO0002 Reserved for concepts with insufficient information to code with codable children: Secondary | ICD-10-CM

## 2015-06-16 NOTE — Patient Instructions (Signed)
Menopause and Herbal Products WHAT IS MENOPAUSE? Menopause is the normal time of life when menstrual periods decrease in frequency and eventually stop completely. This process can take several years for some women. Menopause is complete when you have had an absence of menstruation for a full year since your last menstrual period. It usually occurs between the ages of 48 and 55. It is not common for menopause to begin before the age of 40. During menopause, your body stops producing the female hormones estrogen and progesterone. Common symptoms associated with this loss of hormones (vasomotor symptoms) are:  Hot flashes.  Hot flushes.  Night sweats. Other common symptoms and complications of menopause include:  Decrease in sex drive.  Vaginal dryness and thinning of the walls of the vagina. This can make sex painful.  Dryness of the skin and development of wrinkles.  Headaches.  Tiredness.  Irritability.  Memory problems.  Weight gain.  Bladder infections.  Hair growth on the face and chest.  Inability to reproduce offspring (infertility).  Loss of density in the bones (osteoporosis) increasing your risk for breaks (fractures).  Depression.  Hardening and narrowing of the arteries (atherosclerosis). This increases your risk of heart attack and stroke. WHAT TREATMENT OPTIONS ARE AVAILABLE? There are many treatment choices for menopause symptoms. The most common treatment is hormone replacement therapy. Many alternative therapies for menopause are emerging, including the use of herbal products. These supplements can be found in the form of herbs, teas, oils, tinctures, and pills. Common herbal supplements for menopause are made from plants that contain phytoestrogens. Phytoestrogens are compounds that occur naturally in plants and plant products. They act like estrogen in the body. Foods and herbs that contain phytoestrogens include:  Soy.  Flax seeds.  Red  clover.  Ginseng. WHAT MENOPAUSE SYMPTOMS MAY BE HELPED IF I USE HERBAL PRODUCTS?  Vasomotor symptoms. These may be helped by:  Soy. Some studies show that soy may have a moderate benefit for hot flashes.  Black cohosh. There is limited evidence indicating this may be beneficial for hot flashes.  Symptoms that are related to heart and blood vessel disease. These may be helped by soy. Studies have shown that soy can help to lower cholesterol.  Depression. This may be helped by:  St. John's wort. There is limited evidence that shows this may help mild to moderate depression.  Black cohosh. There is evidence that this may help depression and mood swings.  Osteoporosis. Soy may help to decrease bone loss that is associated with menopause and may prevent osteoporosis. Limited evidence indicates that red clover may offer some bone loss protection as well. Other herbal products that are commonly used during menopause lack enough evidence to support their use as a replacement for conventional menopause therapies. These products include evening primrose, ginseng, and red clover. WHAT ARE THE CASES WHEN HERBAL PRODUCTS SHOULD NOT BE USED DURING MENOPAUSE? Do not use herbal products during menopause without your health care provider's approval if:  You are taking medicine.  You have a preexisting liver condition. ARE THERE ANY RISKS IN MY TAKING HERBAL PRODUCTS DURING MENOPAUSE? If you choose to use herbal products to help with symptoms of menopause, keep in mind that:  Different supplements have different and unmeasured amounts of herbal ingredients.  Herbal products are not regulated the same way that medicines are.  Concentrations of herbs may vary depending on the way they are prepared. For example, the concentration may be different in a pill, tea, oil, and tincture.    Little is known about the risks of using herbal products, particularly the risks of long-term use.  Some herbal  supplements can be harmful when combined with certain medicines. Most commonly reported side effects of herbal products are mild. However, if used improperly, many herbal supplements can cause serious problems. Talk to your health care provider before starting any herbal product. If problems develop, stop taking the supplement and let your health care provider know.   This information is not intended to replace advice given to you by your health care provider. Make sure you discuss any questions you have with your health care provider.   Document Released: 07/26/2007 Document Revised: 02/27/2014 Document Reviewed: 07/22/2013 Elsevier Interactive Patient Education 2016 Elsevier Inc.  Venlafaxine extended-release capsules What is this medicine? VENLAFAXINE(VEN la fax een) is used to treat depression, anxiety and panic disorder. This medicine may be used for other purposes; ask your health care provider or pharmacist if you have questions. What should I tell my health care provider before I take this medicine? They need to know if you have any of these conditions: -bleeding disorders -glaucoma -heart disease -high blood pressure -high cholesterol -kidney disease -liver disease -low levels of sodium in the blood -mania or bipolar disorder -seizures -suicidal thoughts, plans, or attempt; a previous suicide attempt by you or a family -take medicines that treat or prevent blood clots -thyroid disease -an unusual or allergic reaction to venlafaxine, desvenlafaxine, other medicines, foods, dyes, or preservatives -pregnant or trying to get pregnant -breast-feeding How should I use this medicine? Take this medicine by mouth with a full glass of water. Follow the directions on the prescription label. Do not cut, crush, or chew this medicine. Take it with food. If needed, the capsule may be carefully opened and the entire contents sprinkled on a spoonful of cool applesauce. Swallow the  applesauce/pellet mixture right away without chewing and follow with a glass of water to ensure complete swallowing of the pellets. Try to take your medicine at about the same time each day. Do not take your medicine more often than directed. Do not stop taking this medicine suddenly except upon the advice of your doctor. Stopping this medicine too quickly may cause serious side effects or your condition may worsen. A special MedGuide will be given to you by the pharmacist with each prescription and refill. Be sure to read this information carefully each time. Talk to your pediatrician regarding the use of this medicine in children. Special care may be needed. Overdosage: If you think you have taken too much of this medicine contact a poison control center or emergency room at once. NOTE: This medicine is only for you. Do not share this medicine with others. What if I miss a dose? If you miss a dose, take it as soon as you can. If it is almost time for your next dose, take only that dose. Do not take double or extra doses. What may interact with this medicine? Do not take this medicine with any of the following medications: -certain medicines for fungal infections like fluconazole, itraconazole, ketoconazole, posaconazole, voriconazole -cisapride -desvenlafaxine -dofetilide -dronedarone -duloxetine -levomilnacipran -linezolid -MAOIs like Carbex, Eldepryl, Marplan, Nardil, and Parnate -methylene blue (injected into a vein) -milnacipran -pimozide -thioridazine -ziprasidone This medicine may also interact with the following medications: -aspirin and aspirin-like medicines -certain medicines for depression, anxiety, or psychotic disturbances -certain medicines for migraine headaches like almotriptan, eletriptan, frovatriptan, naratriptan, rizatriptan, sumatriptan, zolmitriptan -certain medicines for sleep -certain medicines that treat or prevent  blood clots like dalteparin, enoxaparin,  warfarin -cimetidine -clozapine -diuretics -fentanyl -furazolidone -indinavir -isoniazid -lithium -metoprolol -NSAIDS, medicines for pain and inflammation, like ibuprofen or naproxen -other medicines that prolong the QT interval (cause an abnormal heart rhythm) -procarbazine -rasagiline -supplements like St. John's wort, kava kava, valerian -tramadol -tryptophan This list may not describe all possible interactions. Give your health care provider a list of all the medicines, herbs, non-prescription drugs, or dietary supplements you use. Also tell them if you smoke, drink alcohol, or use illegal drugs. Some items may interact with your medicine. What should I watch for while using this medicine? Tell your doctor if your symptoms do not get better or if they get worse. Visit your doctor or health care professional for regular checks on your progress. Because it may take several weeks to see the full effects of this medicine, it is important to continue your treatment as prescribed by your doctor. Patients and their families should watch out for new or worsening thoughts of suicide or depression. Also watch out for sudden changes in feelings such as feeling anxious, agitated, panicky, irritable, hostile, aggressive, impulsive, severely restless, overly excited and hyperactive, or not being able to sleep. If this happens, especially at the beginning of treatment or after a change in dose, call your health care professional. This medicine can cause an increase in blood pressure. Check with your doctor for instructions on monitoring your blood pressure while taking this medicine. You may get drowsy or dizzy. Do not drive, use machinery, or do anything that needs mental alertness until you know how this medicine affects you. Do not stand or sit up quickly, especially if you are an older patient. This reduces the risk of dizzy or fainting spells. Alcohol may interfere with the effect of this medicine.  Avoid alcoholic drinks. Your mouth may get dry. Chewing sugarless gum, sucking hard candy and drinking plenty of water will help. Contact your doctor if the problem does not go away or is severe. What side effects may I notice from receiving this medicine? Side effects that you should report to your doctor or health care professional as soon as possible: -allergic reactions like skin rash, itching or hives, swelling of the face, lips, or tongue -breathing problems -changes in vision -hallucination, loss of contact with reality -seizures -suicidal thoughts or other mood changes -trouble passing urine or change in the amount of urine -unusual bleeding or bruising Side effects that usually do not require medical attention (report to your doctor or health care professional if they continue or are bothersome): -change in sex drive or performance -constipation -increased sweating -loss of appetite -nausea -tremors -weight loss This list may not describe all possible side effects. Call your doctor for medical advice about side effects. You may report side effects to FDA at 1-800-FDA-1088. Where should I keep my medicine? Keep out of the reach of children. Store at a controlled temperature between 20 and 25 degrees C (68 degrees and 77 degrees F), in a dry place. Throw away any unused medicine after the expiration date. NOTE: This sheet is a summary. It may not cover all possible information. If you have questions about this medicine, talk to your doctor, pharmacist, or health care provider.    2016, Elsevier/Gold Standard. (2012-09-03 12:46:03)

## 2015-06-16 NOTE — Progress Notes (Signed)
Patient ID: Vicki Turner, female   DOB: 1950/03/25, 65 y.o.   MRN: [id number] GYNECOLOGY  VISIT   HPI: 65 y.o.   Married  Caucasian  female   G2P2002 with Patient's last menstrual period was 02/20/1986.   here for evaluation of possible bladder prolapse.  Patient states she has "something" protruding from vagina.  Protrusion for a couple of weeks.  Has been helping her family move. Also lifting grandchild.  Working out more.   Sometimes leaks with sneeze or cough.  Leaks if stands up sometimes. Can also have difficulty to empty bladder.  No urgency or frequency.   Has had pelvic PT for urinary urge control in past.   Occasional constipation.  No splinting.  No fecal incontinence.   TAH for endometriosis.  Still has ovaries.   Having hot flashes at night.  On Paxil.  Wants other options.   GYNECOLOGIC HISTORY: Patient's last menstrual period was 02/20/1986. Contraception:  Hysterectomy Menopausal hormone therapy:  None Last mammogram:  01-28-15 3D/Density B/Neg/BiRads1:[facility] Last pap smear:   12-03-06 Neg        OB History    Gravida Para Term Preterm AB TAB SAB Ectopic Multiple Living   2 2 2  0 0 0 0 0 0 2         Patient Active Problem List   Diagnosis Date Noted  . Migraine, unspecified, without mention of intractable migraine without mention of status migrainosus 09/02/2013  . History of endometriosis 09/02/2013    Past Medical History  Diagnosis Date  . Migraine   . Osteopenia   . History of endometriosis   . History of anemia     Past Surgical History  Procedure Laterality Date  . Total abdominal hysterectomy  1998    DUB/endometriosis  . Nasal sinus surgery  2002  . Nasal septum surgery      Current Outpatient Prescriptions  Medication Sig Dispense Refill  . Calcipotriene 0.005 % solution     . diclofenac (VOLTAREN) 75 MG EC tablet Take 1 tablet by mouth 2 (two) times daily.    . fluticasone (FLONASE) 50 MCG/ACT nasal spray     .  loratadine (CLARITIN) 10 MG tablet Take 10 mg by mouth daily.    . Multiple Vitamin (MULTIVITAMIN) capsule Take 1 capsule by mouth daily.    . nadolol (CORGARD) 20 MG tablet TAKE 1 TABLET (20 MG TOTAL) BY MOUTH DAILY. 90 tablet 4  . PARoxetine (PAXIL) 10 MG tablet Take 1.5 tablets (15 mg total) by mouth daily. 60 tablet 11  . SUMAtriptan (IMITREX) 100 MG tablet Take 1 tablet (100 mg total) by mouth every 2 (two) hours as needed for migraine. 9 tablet 4   No current facility-administered medications for this visit.     ALLERGIES: Novocain  Family History  Problem Relation Age of Onset  . Hypertension Maternal Grandfather   . Heart attack Father   . Miscarriages / Stillbirths Sister   . Transient ischemic attack Maternal Grandmother   . Osteoarthritis Mother     Social History   Social History  . Marital Status: Married    Spouse Name: N/A  . Number of Children: N/A  . Years of Education: N/A   Occupational History  . Not on file.   Social History Main Topics  . Smoking status: Never Smoker   . Smokeless tobacco: Never Used  . Alcohol Use: 2.5 oz/week    5 Standard drinks or equivalent per week  Comment: wine  . Drug Use: No  . Sexual Activity:    Partners: Male    Birth Control/ Protection: Surgical     Comment: TAH   Other Topics Concern  . Not on file   Social History Narrative    ROS:  Pertinent items are noted in HPI.  PHYSICAL EXAMINATION:    Ht  (1.676 m)  Wt 151 lb (68.493 kg)  BMI 24.38 kg/m2  LMP 02/20/1986    General appearance: alert, cooperative and appears stated age    Pelvic: External genitalia:  no lesions              Urethra:  normal appearing urethra with no masses, tenderness or lesions              Bartholins and Skenes: normal                 Vagina: normal appearing vagina with normal color and discharge, no lesions.  Third degree cystocele, minimal apical prolapse, first degree rectocele.              Cervix: absent      Bimanual Exam:  Uterus:  uterus absent              Adnexa: no mass, fullness, tenderness              Rectal exam: Yes.  .  Confirms.              Anus:  normal sphincter tone, no lesions  Chaperone was present for exam.  ASSESSMENT  Incomplete vaginal prolapse.  GSI. Status post TAH.  Ovaries remain.  Menopausal symptoms. On Paxil.  PLAN  Discussion of vaginal prolapse and stress incontinence.  Etiologies and tx options discussed including observation, Impressa, pessary and surgical correction.  Surgical care could include an anterior and posterior colporrhaphy and midurethral sling with cystoscopy.  Discussed reduction of lifting and straining to reduce risk of progression of prolapse/incontinence.  Patient desires to return for a pessary fitting.  I have discussed and provided written information about herbal options and Effexor in place of Paxil for treatment of menopausal symptoms. I have recommended she return for consultation with Dr. Hyacinth Meeker to review her menopausal care.     An After Visit Summary was printed and given to the patient.  __25____ minutes face to face time of which over 50% was spent in counseling.

## 2015-06-18 ENCOUNTER — Encounter: Payer: Self-pay | Admitting: Obstetrics and Gynecology

## 2015-06-18 ENCOUNTER — Ambulatory Visit: Payer: Medicare Other | Admitting: Obstetrics and Gynecology

## 2015-06-18 ENCOUNTER — Ambulatory Visit (INDEPENDENT_AMBULATORY_CARE_PROVIDER_SITE_OTHER): Payer: Medicare Other | Admitting: Obstetrics and Gynecology

## 2015-06-18 VITALS — BP 120/74 | HR 56 | Ht 66.0 in | Wt 151.0 lb

## 2015-06-18 DIAGNOSIS — N816 Rectocele: Secondary | ICD-10-CM

## 2015-06-18 DIAGNOSIS — N8110 Cystocele, unspecified: Secondary | ICD-10-CM

## 2015-06-18 DIAGNOSIS — N393 Stress incontinence (female) (male): Secondary | ICD-10-CM | POA: Diagnosis not present

## 2015-06-18 DIAGNOSIS — IMO0002 Reserved for concepts with insufficient information to code with codable children: Secondary | ICD-10-CM

## 2015-06-18 NOTE — Progress Notes (Signed)
Patient ID: Vicki Turner, female   DOB: Mar 20, 1950, 65 y.o.   MRN: [id number] GYNECOLOGY  VISIT   HPI: 65 y.o.   Married  Caucasian  female   G2P2002 with Patient's last menstrual period was 02/20/1986.   here for pessary fitting.   Can leak with cough and sneeze and have difficulty emptying bladder.   GYNECOLOGIC HISTORY: Patient's last menstrual period was 02/20/1986. Contraception:  Hysterectomy Menopausal hormone therapy:  None Last mammogram:  01-28-15 3D/Density B/Neg/BiRads1:[facility]. Last pap smear:   12-03-06 Neg.        OB History    Gravida Para Term Preterm AB TAB SAB Ectopic Multiple Living   2 2 2  0 0 0 0 0 0 2         Patient Active Problem List   Diagnosis Date Noted  . Migraine, unspecified, without mention of intractable migraine without mention of status migrainosus 09/02/2013  . History of endometriosis 09/02/2013    Past Medical History  Diagnosis Date  . Migraine   . Osteopenia   . History of endometriosis   . History of anemia     Past Surgical History  Procedure Laterality Date  . Total abdominal hysterectomy  1998    DUB/endometriosis  . Nasal sinus surgery  2002  . Nasal septum surgery      Current Outpatient Prescriptions  Medication Sig Dispense Refill  . Calcipotriene 0.005 % solution as needed.     . diclofenac (VOLTAREN) 75 MG EC tablet Take 1 tablet by mouth 2 (two) times daily.    . fluticasone (FLONASE) 50 MCG/ACT nasal spray     . loratadine (CLARITIN) 10 MG tablet Take 10 mg by mouth daily.    . Multiple Vitamin (MULTIVITAMIN) capsule Take 1 capsule by mouth daily.    . nadolol (CORGARD) 20 MG tablet TAKE 1 TABLET (20 MG TOTAL) BY MOUTH DAILY. 90 tablet 4  . PARoxetine (PAXIL) 10 MG tablet Take 1.5 tablets (15 mg total) by mouth daily. 60 tablet 11  . SUMAtriptan (IMITREX) 100 MG tablet Take 1 tablet (100 mg total) by mouth every 2 (two) hours as needed for migraine. 9 tablet 4   No current facility-administered  medications for this visit.     ALLERGIES: Novocain  Family History  Problem Relation Age of Onset  . Hypertension Maternal Grandfather   . Heart attack Father   . Miscarriages / Stillbirths Sister   . Transient ischemic attack Maternal Grandmother   . Osteoarthritis Mother     Social History   Social History  . Marital Status: Married    Spouse Name: N/A  . Number of Children: N/A  . Years of Education: N/A   Occupational History  . Not on file.   Social History Main Topics  . Smoking status: Never Smoker   . Smokeless tobacco: Never Used  . Alcohol Use: 3.0 oz/week    5 Standard drinks or equivalent per week     Comment: wine  . Drug Use: No  . Sexual Activity:    Partners: Male    Birth Control/ Protection: Surgical     Comment: TAH   Other Topics Concern  . Not on file   Social History Narrative    ROS:  Pertinent items are noted in HPI.  PHYSICAL EXAMINATION:    BP 120/74 mmHg  Pulse 56  Ht 5\' 6"  (1.676 m)  Wt 151 lb (68.493 kg)  BMI 24.38 kg/m2  LMP 02/20/1986  General appearance: alert, cooperative and appears stated age   Pelvic: External genitalia:  no lesions     Bimanual Exam:  Uterus:  normal size, contour, position, consistency, mobility, non-tender              Adnexa: normal adnexa and no mass, fullness, tenderness      #3 incontinence dish with support comfortable with maneuvers.  No GSI with strain.  #3 ring with support comfortable but had stress incontinence with strain.  Chaperone was present for exam.  ASSESSMENT  Cystocele. Rectocele. Genuine stress incontinence.   PLAN  Pessary care reviewed - can remove twice a week or as infrequently as every 3 months if the mucosa tolerates this. Discussed possible vaginal erosions.  Will order a #3 incontinence dish with support.  Return for follow up visit when pessary arrives.   An After Visit Summary was printed and given to the patient.  ___15___ minutes face to face time  of which over 50% was spent in counseling.

## 2015-07-01 ENCOUNTER — Telehealth: Payer: Self-pay

## 2015-07-01 NOTE — Telephone Encounter (Signed)
Spoke with patient and notified her pessary has arrived in office.  Made appointment for placement 07-02-15 at 9:30am.

## 2015-07-02 ENCOUNTER — Encounter: Payer: Self-pay | Admitting: Obstetrics and Gynecology

## 2015-07-02 ENCOUNTER — Ambulatory Visit (INDEPENDENT_AMBULATORY_CARE_PROVIDER_SITE_OTHER): Payer: Medicare Other | Admitting: Obstetrics and Gynecology

## 2015-07-02 VITALS — BP 132/70 | HR 60 | Resp 16 | Ht 65.5 in | Wt 152.0 lb

## 2015-07-02 DIAGNOSIS — N816 Rectocele: Secondary | ICD-10-CM | POA: Diagnosis not present

## 2015-07-02 DIAGNOSIS — N8110 Cystocele, unspecified: Secondary | ICD-10-CM | POA: Diagnosis not present

## 2015-07-02 DIAGNOSIS — IMO0002 Reserved for concepts with insufficient information to code with codable children: Secondary | ICD-10-CM

## 2015-07-02 NOTE — Progress Notes (Signed)
GYNECOLOGY  VISIT   HPI: 65 y.o.   Married  Caucasian  female   G2P2002 with Patient's last menstrual period was 02/20/1986.   here for   Pessary placement.  Can leak with cough and sneeze and have difficulty emptying bladder. Most concerned about her urinary incontinence.   GYNECOLOGIC HISTORY: Patient's last menstrual period was 02/20/1986. Contraception:  Post-Hysterectomy Menopausal hormone therapy:  none Last mammogram:  01/28/15 BIRADS1 negative Last pap smear:   12/03/06 Neg.        OB History    Gravida Para Term Preterm AB TAB SAB Ectopic Multiple Living   2 2 2  0 0 0 0 0 0 2         Patient Active Problem List   Diagnosis Date Noted  . Migraine, unspecified, without mention of intractable migraine without mention of status migrainosus 09/02/2013  . History of endometriosis 09/02/2013    Past Medical History  Diagnosis Date  . Migraine   . Osteopenia   . History of endometriosis   . History of anemia     Past Surgical History  Procedure Laterality Date  . Total abdominal hysterectomy  1998    DUB/endometriosis  . Nasal sinus surgery  2002  . Nasal septum surgery      Current Outpatient Prescriptions  Medication Sig Dispense Refill  . Calcipotriene 0.005 % solution as needed.     . diclofenac (VOLTAREN) 75 MG EC tablet Take 1 tablet by mouth 2 (two) times daily.    . fluticasone (FLONASE) 50 MCG/ACT nasal spray     . loratadine (CLARITIN) 10 MG tablet Take 10 mg by mouth daily.    . Multiple Vitamin (MULTIVITAMIN) capsule Take 1 capsule by mouth daily.    . nadolol (CORGARD) 20 MG tablet TAKE 1 TABLET (20 MG TOTAL) BY MOUTH DAILY. 90 tablet 4  . PARoxetine (PAXIL) 10 MG tablet Take 1.5 tablets (15 mg total) by mouth daily. 60 tablet 11  . SUMAtriptan (IMITREX) 100 MG tablet Take 1 tablet (100 mg total) by mouth every 2 (two) hours as needed for migraine. 9 tablet 4   No current facility-administered medications for this visit.     ALLERGIES:  Novocain  Family History  Problem Relation Age of Onset  . Hypertension Maternal Grandfather   . Heart attack Father   . Miscarriages / Stillbirths Sister   . Transient ischemic attack Maternal Grandmother   . Osteoarthritis Mother     Social History   Social History  . Marital Status: Married    Spouse Name: N/A  . Number of Children: N/A  . Years of Education: N/A   Occupational History  . Not on file.   Social History Main Topics  . Smoking status: Never Smoker   . Smokeless tobacco: Never Used  . Alcohol Use: 3.0 oz/week    5 Standard drinks or equivalent per week     Comment: wine  . Drug Use: No  . Sexual Activity:    Partners: Male    Birth Control/ Protection: Surgical     Comment: TAH   Other Topics Concern  . Not on file   Social History Narrative    ROS:  Pertinent items are noted in HPI.  PHYSICAL EXAMINATION:    BP 132/70 mmHg  Pulse 60  Resp 16  Ht 5' 5.5" (1.664 m)  Wt 152 lb (68.947 kg)  BMI 24.90 kg/m2  LMP 02/20/1986    General appearance: alert, cooperative and appears stated age  Pelvic: External genitalia:  no lesions              Urethra:  normal appearing urethra with no masses, tenderness or lesions              Bartholins and Skenes: normal                 Vagina: normal appearing vagina with normal color and discharge, no lesions              Cervix: absent   Bimanual Exam:  Uterus:  uterus absent              Adnexa: no mass, fullness, tenderness         Pessary #3 incontinence dish.  Lot number [id number], [employer]. Chaperone was present for exam.  ASSESSMENT  Cystocele.  Rectocele. Stress incontinence. Pessary fitted.  PLAN  Pessary fitted.  Patient about to do maneuvers and maintain pessary.  She can place and remove it.  She will void prior to leaving the office.  Instructed in care and signs and symptoms to report.  Follow up in  1 - 2 weeks for recheck.   An After Visit Summary was printed and given  to the patient.  __15____ minutes face to face time of which over 50% was spent in counseling.

## 2015-07-09 ENCOUNTER — Encounter: Payer: Self-pay | Admitting: Obstetrics and Gynecology

## 2015-07-09 ENCOUNTER — Ambulatory Visit (INDEPENDENT_AMBULATORY_CARE_PROVIDER_SITE_OTHER): Payer: Medicare Other | Admitting: Obstetrics and Gynecology

## 2015-07-09 VITALS — BP 110/72 | HR 80 | Ht 65.5 in | Wt 150.4 lb

## 2015-07-09 DIAGNOSIS — N816 Rectocele: Secondary | ICD-10-CM | POA: Diagnosis not present

## 2015-07-09 DIAGNOSIS — N393 Stress incontinence (female) (male): Secondary | ICD-10-CM

## 2015-07-09 DIAGNOSIS — IMO0002 Reserved for concepts with insufficient information to code with codable children: Secondary | ICD-10-CM

## 2015-07-09 DIAGNOSIS — N952 Postmenopausal atrophic vaginitis: Secondary | ICD-10-CM | POA: Diagnosis not present

## 2015-07-09 DIAGNOSIS — N8110 Cystocele, unspecified: Secondary | ICD-10-CM

## 2015-07-09 MED ORDER — ESTROGENS, CONJUGATED 0.625 MG/GM VA CREA: TOPICAL_CREAM | VAGINAL | Status: DC

## 2015-07-09 NOTE — Progress Notes (Signed)
Patient ID: Vicki Turner, female   DOB: March 07, 1950, 65 y.o.   MRN: [id number] GYNECOLOGY  VISIT   HPI: 65 y.o.   Married  Caucasian  female   G2P2002 with Patient's last menstrual period was 02/20/1986.   here for 1 week pessary follow up.   Has #3 incontinence dish.   Takes it out to have a BM.  Still has a little bit of urinary leakage with standing up if bladder is full. No leak with laugh or cough.  Able to go to the gym.    Has some blood and mucous when she removes it in the morning.  Not using gel. No pain.  GYNECOLOGIC HISTORY: Patient's last menstrual period was 02/20/1986. Contraception:  Hysterectomy Menopausal hormone therapy:  None Last mammogram:  01-28-15 3D/Density B/Neg/BiRads1:[facility] Last pap smear:  12-03-06 Neg         OB History    Gravida Para Term Preterm AB TAB SAB Ectopic Multiple Living   2 2 2  0 0 0 0 0 0 2         Patient Active Problem List   Diagnosis Date Noted  . Migraine, unspecified, without mention of intractable migraine without mention of status migrainosus 09/02/2013  . History of endometriosis 09/02/2013    Past Medical History  Diagnosis Date  . Migraine   . Osteopenia   . History of endometriosis   . History of anemia     Past Surgical History  Procedure Laterality Date  . Total abdominal hysterectomy  1998    DUB/endometriosis  . Nasal sinus surgery  2002  . Nasal septum surgery      Current Outpatient Prescriptions  Medication Sig Dispense Refill  . Calcipotriene 0.005 % solution as needed.     . diclofenac (VOLTAREN) 75 MG EC tablet Take 1 tablet by mouth 2 (two) times daily.    . fluticasone (FLONASE) 50 MCG/ACT nasal spray     . loratadine (CLARITIN) 10 MG tablet Take 10 mg by mouth daily.    . Multiple Vitamin (MULTIVITAMIN) capsule Take 1 capsule by mouth daily.    . nadolol (CORGARD) 20 MG tablet TAKE 1 TABLET (20 MG TOTAL) BY MOUTH DAILY. 90 tablet 4  . PARoxetine (PAXIL) 10 MG tablet Take 1.5  tablets (15 mg total) by mouth daily. 60 tablet 11  . SUMAtriptan (IMITREX) 100 MG tablet Take 1 tablet (100 mg total) by mouth every 2 (two) hours as needed for migraine. 9 tablet 4   No current facility-administered medications for this visit.     ALLERGIES: Novocain  Family History  Problem Relation Age of Onset  . Hypertension Maternal Grandfather   . Heart attack Father   . Miscarriages / Stillbirths Sister   . Transient ischemic attack Maternal Grandmother   . Osteoarthritis Mother     Social History   Social History  . Marital Status: Married    Spouse Name: N/A  . Number of Children: N/A  . Years of Education: N/A   Occupational History  . Not on file.   Social History Main Topics  . Smoking status: Never Smoker   . Smokeless tobacco: Never Used  . Alcohol Use: 3.0 oz/week    5 Standard drinks or equivalent per week     Comment: wine  . Drug Use: No  . Sexual Activity:    Partners: Male    Birth Control/ Protection: Surgical     Comment: TAH   Other Topics Concern  .  Not on file   Social History Narrative    ROS:  Pertinent items are noted in HPI.  PHYSICAL EXAMINATION:    BP 110/72 mmHg  Pulse 80  Ht 5' 5.5" (1.664 m)  Wt 150 lb 6.4 oz (68.221 kg)  BMI 24.64 kg/m2  LMP 02/20/1986    General appearance: alert, cooperative and appears stated age   Pelvic: External genitalia:  no lesions              Urethra:  normal appearing urethra with no masses, tenderness or lesions              Bartholins and Skenes: normal                 Vagina: normal appearing vagina with normal color and discharge, no lesions.  Third degree cystocele.  First degree rectocele.  Absent cervix. minimal erythema.  One 2 mm area of right vaginal posterior wall with petechia.               Cervix: absent              Bimanual Exam:  Uterus:  uterus absent              Adnexa: no mass, fullness, tenderness             Pessary removed, cleansed, and replaced.  Some greenish  mucous noted.  Chaperone was present for exam.  ASSESSMENT  Cystocele.  Rectocele.  GSI.  Doing well overall with incontinence dish.  PLAN  Premarin cream 1/2 gm per hs x 2 weeks and then 1/2 gm per hs twice weekly.  Discussed potential risks of DVT, PE, MI, stroke, and breast cancer.  Continue pessary use. Keep annual exam appointment.    An After Visit Summary was printed and given to the patient.  ___15___ minutes face to face time of which over 50% was spent in counseling.

## 2015-09-15 ENCOUNTER — Encounter: Payer: Self-pay | Admitting: Nurse Practitioner

## 2015-09-15 ENCOUNTER — Telehealth: Payer: Self-pay | Admitting: Nurse Practitioner

## 2015-09-15 ENCOUNTER — Ambulatory Visit (INDEPENDENT_AMBULATORY_CARE_PROVIDER_SITE_OTHER): Payer: Medicare Other | Admitting: Nurse Practitioner

## 2015-09-15 VITALS — BP 132/84 | HR 52 | Ht 65.5 in | Wt 153.0 lb

## 2015-09-15 DIAGNOSIS — Z1389 Encounter for screening for other disorder: Secondary | ICD-10-CM | POA: Diagnosis not present

## 2015-09-15 DIAGNOSIS — Z01419 Encounter for gynecological examination (general) (routine) without abnormal findings: Secondary | ICD-10-CM | POA: Diagnosis not present

## 2015-09-15 DIAGNOSIS — Z0000 Encounter for general adult medical examination without abnormal findings: Secondary | ICD-10-CM | POA: Diagnosis not present

## 2015-09-15 DIAGNOSIS — Z1211 Encounter for screening for malignant neoplasm of colon: Secondary | ICD-10-CM | POA: Diagnosis not present

## 2015-09-15 LAB — POCT URINALYSIS DIPSTICK
BILIRUBIN UA: NEGATIVE
Blood, UA: NEGATIVE
GLUCOSE UA: NEGATIVE
KETONES UA: NEGATIVE
LEUKOCYTES UA: NEGATIVE
Nitrite, UA: NEGATIVE
Protein, UA: NEGATIVE
Urobilinogen, UA: NEGATIVE
pH, UA: 6

## 2015-09-15 MED ORDER — GABAPENTIN 100 MG PO CAPS: 100.0000 mg | ORAL_CAPSULE | Freq: Every day | ORAL | 0 refills | Status: DC

## 2015-09-15 NOTE — Progress Notes (Signed)
Reviewed personally.  M. Suzanne Derin Matthes, MD.  

## 2015-09-15 NOTE — Progress Notes (Signed)
Patient ID: Vicki Turner, female   DOB: [birth year], 65 y.o.   MRN: [id number]  65 y.o. G78P2002 Married Caucasian Fe here for annual exam.  She has history of urinary incontinence and cystocele.  She has seen Dr. Edward Jolly and they did discuss surgical options.  She decided on using a dish pessary since 4/28 and notes some better with urinary incontinence. However, not convenient for BM.  Comes out on its own every time and has to take time  to clean and reinsert.  Using vaginal estrogen daily as directed.   Getting ready for left knee replacement  in October.  Still having significant vaso symptoms despite Paxil @ 15 mg daily.  Reluctant to increase due to side effects.  It was suggested to change to Effexor but does not want to do that drug.  She does ask about Gabapentin.  Patient's last menstrual period was 02/20/1986.          Sexually active: No.  The current method of family planning is status post hysterectomy.    Exercising: Yes.    Gym/ health club routine includes cardio three times per week. Smoker:  no  Health Maintenance: Pap: 12/03/06, Negative (Hysterectomy) MMG: 01/28/15, 3D, Bi-Rads 1:  Negative  Colonoscopy:11/08-repeat in 10 years, Dr. Loreta Ave BMD:12/21/14, -1.6 Spine / -2.0 Left Femur Neck, Right hip not tested TDaP: 2008 Shingles: 2012 Pneumonia: ? 2012 with shingles vaccine, in records with [facility] Hep C and HIV:  discuss today, previously tested for Hep C after trying to donate blood 25 years ago - she will discuss further with PCP Labs: PCP   reports that she has never smoked. She has never used smokeless tobacco. She reports that she drinks about 3.0 oz of alcohol per week . She reports that she does not use drugs.  Past Medical History:  Diagnosis Date  . History of anemia   . History of endometriosis   . Migraine   . Osteopenia     Past Surgical History:  Procedure Laterality Date  . NASAL SEPTUM SURGERY    . NASAL SINUS SURGERY  2002  . TOTAL ABDOMINAL  HYSTERECTOMY  1998   DUB/endometriosis    Current Outpatient Prescriptions  Medication Sig Dispense Refill  . Calcipotriene 0.005 % solution as needed.     . conjugated estrogens (PREMARIN) vaginal cream Use 1/2 g vaginally every night at bed time for the first 2 weeks, then use 1/2 g vaginally two or three times per week. 30 g 2  . diclofenac (VOLTAREN) 75 MG EC tablet Take 1 tablet by mouth 2 (two) times daily.    . fluticasone (FLONASE) 50 MCG/ACT nasal spray     . loratadine (CLARITIN) 10 MG tablet Take 10 mg by mouth daily.    . Multiple Vitamin (MULTIVITAMIN) capsule Take 1 capsule by mouth daily.    . nadolol (CORGARD) 20 MG tablet TAKE 1 TABLET (20 MG TOTAL) BY MOUTH DAILY. 90 tablet 4  . PARoxetine (PAXIL) 10 MG tablet Take 1.5 tablets (15 mg total) by mouth daily. 60 tablet 11  . SUMAtriptan (IMITREX) 100 MG tablet Take 1 tablet (100 mg total) by mouth every 2 (two) hours as needed for migraine. 9 tablet 4   No current facility-administered medications for this visit.     Family History  Problem Relation Age of Onset  . Hypertension Maternal Grandfather   . Heart attack Father   . Miscarriages / Stillbirths Sister   . Transient ischemic attack Maternal Grandmother   .  Osteoarthritis Mother     ROS:  Pertinent items are noted in HPI.  Otherwise, a comprehensive ROS was negative.  Exam:   LMP 02/20/1986    Ht Readings from Last 3 Encounters:  07/09/15 5' 5.5" (1.664 m)  07/02/15 5' 5.5" (1.664 m)  06/18/15 5\' 6"  (1.676 m)    General appearance: alert, cooperative and appears stated age Head: Normocephalic, without obvious abnormality, atraumatic Neck: no adenopathy, supple, symmetrical, trachea midline and thyroid normal to inspection and palpation Lungs: clear to auscultation bilaterally Breasts: normal appearance, no masses or tenderness Heart: regular rate and rhythm Abdomen: soft, non-tender; no masses,  no organomegaly Extremities: extremities normal,  atraumatic, no cyanosis or edema Skin: Skin color, texture, turgor normal. No rashes or lesions Lymph nodes: Cervical, supraclavicular, and axillary nodes normal. No abnormal inguinal nodes palpated Neurologic: Grossly normal   Pelvic: External genitalia:  no lesions              Urethra:  normal appearing urethra with no masses, tenderness or lesions              Bartholin's and Skene's: normal                 Vagina: pessary is in the correct position and is removed and cleaned.  Normal appearing vagina with normal color and discharge, no lesions.  The pessary is reinserted.              Cervix: absent              Pap taken: No. Bimanual Exam:  Uterus:  uterus absent              Adnexa: no mass, fullness, tenderness               Rectovaginal: Confirms               Anus:  normal sphincter tone, no lesions  Chaperone present: yes  A:  Well Woman with normal exam             S/P TAH secondary to endometriosis 1998             ERT replacement from about age 34 till 01/2014              History of migraine headaches.  Use of # 3 Incontinence dish with support since 06/18/15 - not always convenient to use and still may want to consider surgical options.    P:   Reviewed health and wellness pertinent to exam  Pap smear as above  Mammogram is due 01/2016  She will reduce Premarin vaginal cream to 3 times a week instead of nightly.  Counseled about risk of CVA, DVT, cancer, etc.  Will consult with Dr. Hyacinth Meeker about use of Gabapentin for vaso symptoms.  Counseled on breast self exam, mammography screening, use and side effects of HRT, adequate intake of calcium and vitamin D, diet and exercise return annually or prn  An After Visit Summary was printed and given to the patient.

## 2015-09-15 NOTE — Patient Instructions (Signed)

## 2015-09-15 NOTE — Telephone Encounter (Signed)
Patient is called and per DPR may leave a detailed message on cell number.  She is told that Dr. Hyacinth Meeker has reviewed her chart and she may start on Gabapentin 100 mg at HS. potential side effects are dizziness and drowsiness.  She will start out at 100 mg nightly for a few weeks then to call back if feels she needs an increase.  I feel she would do better with calling before she increases.  Order is sent to pharmacy.  On the message I instructed her that Judeth Cornfield would also call her to follow up.

## 2015-09-21 NOTE — Telephone Encounter (Signed)
I spoke to the patient and she has started the gabapentin and is doing well.  She states she has not noticed any dizziness or drowsiness.  Feels she is doing well. Pt would like to kow how long she needs to be on gabapentin before she can start weaning Paxil.  Please advise.

## 2015-09-21 NOTE — Telephone Encounter (Signed)
Once she is on Gabapentin for a month and is not symptomatic she can start to wean off Paxil.

## 2015-09-30 NOTE — Telephone Encounter (Signed)
Patient notified of recommendation below.  She is agreeable with this plan.  She states she has had a couple of "minor" night sweats since starting Paxil and these were nothing like she was having before.  Patient still denies any dizziness or drowsiness.   Patient will call back when she is close to needing refill with update of symptoms and for weaning instructions.  Routing to provider for final review.  Closing encounter.

## 2015-10-02 ENCOUNTER — Other Ambulatory Visit: Payer: Self-pay | Admitting: Nurse Practitioner

## 2015-10-04 NOTE — Telephone Encounter (Signed)
Medication refill request: PARoxetine 10mg  Last AEX:  09/15/15 PG Next AEX: 12/15/16 SM Last MMG (if hormonal medication request): 01/28/15 BIRADS1 negative Refill authorized: 09/21/14 #60 w/11 refills; today #60 w/11?

## 2015-10-13 ENCOUNTER — Other Ambulatory Visit: Payer: Self-pay | Admitting: Nurse Practitioner

## 2015-10-13 NOTE — Telephone Encounter (Signed)
Patient called requesting to speak with the nurse. She said, "Patty told me to call and request directions on how to wean myself off Paxil."

## 2015-10-13 NOTE — Telephone Encounter (Signed)
Medication refill request: gabapentin  Last AEX:  09-15-15  Next AEX: 12-15-16 Last MMG (if hormonal medication request): 01-29-15 WNL Refill authorized: please advise

## 2015-10-21 ENCOUNTER — Telehealth: Payer: Self-pay | Admitting: Obstetrics & Gynecology

## 2015-10-21 NOTE — Telephone Encounter (Signed)
Patient is calling to speak with the nurse. She has some questions regarding weaning off of Paxil. She states she called about a week ago and has not received a call yet.

## 2015-10-21 NOTE — Telephone Encounter (Signed)
Have her go down to 10 mg a day for 2 weeks. If tolerating cut the tablet in 1/2, take 5 mg a day for 2 weeks. If tolerating stop it. Call with any concerns.

## 2015-10-21 NOTE — Telephone Encounter (Signed)
Spoke with patient. Advised of message as seen below from Dr.Jertson. Patient is agreeable and verbalizes understanding.  Routing to provider for final review. Patient agreeable to disposition. Will close encounter.  

## 2015-10-21 NOTE — Telephone Encounter (Signed)
Dr.Jertson, patient is currently taking Paxil 15 mg daily. She would like to wean off of the medication. Patient's phone call from 10/13/2015 was routed to the refill pool. Please advise on recommends for patient as Ria Commentatricia Grubb, FNP is out of the office today.

## 2015-11-02 ENCOUNTER — Other Ambulatory Visit: Payer: Self-pay | Admitting: Nurse Practitioner

## 2015-11-02 NOTE — Telephone Encounter (Signed)
Medication refill request: Nadolol Last AEX:  09/16/15 PG Next AEX: 12/15/16 SM Last MMG (if hormonal medication request): 01/28/15 BIRADS1 Refill authorized: 09/09/14 #90 4R. Please advise. Thank you.

## 2015-11-02 NOTE — Telephone Encounter (Signed)
Please ask pt why we are giving this and not her PCP.  Did we give a refill to hold her until she got in to see PCP?

## 2015-11-03 NOTE — Telephone Encounter (Signed)
Per OV on 09/16/15, note states refill was for Migraines. Please advise. Thank you.

## 2015-11-12 NOTE — H&P (Signed)
TOTAL KNEE ADMISSION H&P  Patient is being admitted for left total knee arthroplasty.  Subjective:  Chief Complaint:   Left knee primary OA / pain  HPI: Vicki Turner, 65 y.o. female, has a history of pain and functional disability in the left knee due to arthritis and has failed non-surgical conservative treatments for greater than 12 weeks to includeNSAID's and/or analgesics, corticosteriod injections, viscosupplementation injections and activity modification.  Onset of symptoms was gradual, starting 3+ years ago with gradually worsening course since that time. The patient noted no past surgery on the left knee(s).  Patient currently rates pain in the left knee(s) at 9 out of 10 with activity. Patient has night pain, worsening of pain with activity and weight bearing, pain that interferes with activities of daily living, pain with passive range of motion, crepitus and joint swelling.  Patient has evidence of periarticular osteophytes and joint space narrowing by imaging studies.  There is no active infection.  Risks, benefits and expectations were discussed with the patient.  Risks including but not limited to the risk of anesthesia, blood clots, nerve damage, blood vessel damage, failure of the prosthesis, infection and up to and including death.  Patient understand the risks, benefits and expectations and wishes to proceed with surgery.   PCP: Mickie HillierLITTLE,KEVIN LORNE, MD  D/C Plans:      Home with HHPT  Post-op Meds:       No Rx given  Tranexamic Acid:      To be given - IV   Decadron:      Is to be given  FYI:     ASA  Norco     Patient Active Problem List   Diagnosis Date Noted  . Migraine, unspecified, without mention of intractable migraine without mention of status migrainosus 09/02/2013  . History of endometriosis 09/02/2013   Past Medical History:  Diagnosis Date  . History of anemia   . History of endometriosis   . Migraine   . Osteopenia   . Vaginal prolapse     Past  Surgical History:  Procedure Laterality Date  . NASAL SEPTUM SURGERY    . NASAL SINUS SURGERY  2002  . TOTAL ABDOMINAL HYSTERECTOMY  1998   DUB/endometriosis    No prescriptions prior to admission.   Allergies  Allergen Reactions  . Novocain [Procaine] Other (See Comments)    Jittery feeling    Social History  Substance Use Topics  . Smoking status: Never Smoker  . Smokeless tobacco: Never Used  . Alcohol use 3.0 oz/week    5 Standard drinks or equivalent per week     Comment: wine    Family History  Problem Relation Age of Onset  . Hypertension Maternal Grandfather   . Heart attack Father   . Miscarriages / Stillbirths Sister   . Transient ischemic attack Maternal Grandmother   . Osteoarthritis Mother      Review of Systems  Constitutional: Negative.   Eyes: Negative.   Respiratory: Negative.   Cardiovascular: Negative.   Gastrointestinal: Negative.   Genitourinary: Negative.   Musculoskeletal: Positive for joint pain.  Skin: Negative.   Neurological: Positive for headaches.  Endo/Heme/Allergies: Negative.   Psychiatric/Behavioral: Negative.     Objective:  Physical Exam  Constitutional: She is oriented to person, place, and time. She appears well-developed.  HENT:  Head: Normocephalic.  Eyes: Pupils are equal, round, and reactive to light.  Neck: Neck supple. No JVD present. No tracheal deviation present. No thyromegaly present.  Cardiovascular:  Normal rate, regular rhythm, normal heart sounds and intact distal pulses.   Respiratory: Effort normal and breath sounds normal. No respiratory distress. She has no wheezes.  GI: Soft. There is no tenderness. There is no guarding.  Musculoskeletal:       Left knee: She exhibits swelling and bony tenderness. She exhibits normal range of motion, no ecchymosis, no deformity, no laceration and no erythema. Tenderness found.  Lymphadenopathy:    She has no cervical adenopathy.  Neurological: She is alert and oriented  to person, place, and time.  Skin: Skin is warm and dry.  Psychiatric: She has a normal mood and affect.      Labs:  Estimated body mass index is 25.07 kg/m as calculated from the following:   Height as of 09/15/15: 5' 5.5" (1.664 m).   Weight as of 09/15/15: 69.4 kg (153 lb).   Imaging Review Plain radiographs demonstrate severe degenerative joint disease of the left knee(s). The overall alignment isneutral. The bone quality appears to be good for age and reported activity level.  Assessment/Plan:  End stage arthritis, left knee   The patient history, physical examination, clinical judgment of the provider and imaging studies are consistent with end stage degenerative joint disease of the left knee(s) and total knee arthroplasty is deemed medically necessary. The treatment options including medical management, injection therapy arthroscopy and arthroplasty were discussed at length. The risks and benefits of total knee arthroplasty were presented and reviewed. The risks due to aseptic loosening, infection, stiffness, patella tracking problems, thromboembolic complications and other imponderables were discussed. The patient acknowledged the explanation, agreed to proceed with the plan and consent was signed. Patient is being admitted for inpatient treatment for surgery, pain control, PT, OT, prophylactic antibiotics, VTE prophylaxis, progressive ambulation and ADL's and discharge planning. The patient is planning to be discharged home with home health services.     Anastasio Auerbach Ennio Houp   PA-C  11/12/2015, 10:55 AM

## 2015-11-23 ENCOUNTER — Encounter (HOSPITAL_COMMUNITY)
Admission: RE | Admit: 2015-11-23 | Discharge: 2015-11-23 | Disposition: A | Discharge status: Home / Self Care | Payer: Medicare Other | Source: Ambulatory Visit | Attending: Orthopedic Surgery | Admitting: Orthopedic Surgery

## 2015-11-23 ENCOUNTER — Encounter (HOSPITAL_COMMUNITY): Payer: Self-pay

## 2015-11-23 DIAGNOSIS — Z01818 Encounter for other preprocedural examination: Secondary | ICD-10-CM | POA: Insufficient documentation

## 2015-11-23 DIAGNOSIS — M1712 Unilateral primary osteoarthritis, left knee: Secondary | ICD-10-CM | POA: Insufficient documentation

## 2015-11-23 HISTORY — DX: Essential (primary) hypertension: I10

## 2015-11-23 HISTORY — DX: Allergy status to unspecified drugs, medicaments and biological substances: Z88.9

## 2015-11-23 HISTORY — DX: Chronic sinusitis, unspecified: J32.9

## 2015-11-23 HISTORY — DX: Family history of other specified conditions: Z84.89

## 2015-11-23 LAB — BASIC METABOLIC PANEL
Anion gap: 5 (ref 5–15)
BUN: 26 mg/dL — AB (ref 6–20)
CHLORIDE: 106 mmol/L (ref 101–111)
CO2: 26 mmol/L (ref 22–32)
CREATININE: 0.84 mg/dL (ref 0.44–1.00)
Calcium: 9.3 mg/dL (ref 8.9–10.3)
GFR calc Af Amer: >60 mL/min (ref >60)
GFR calc non Af Amer: >60 mL/min (ref >60)
Glucose, Bld: 106 mg/dL — ABNORMAL HIGH (ref 65–99)
Potassium: 4 mmol/L (ref 3.5–5.1)
SODIUM: 137 mmol/L (ref 135–145)

## 2015-11-23 LAB — CBC
HCT: 42.4 % (ref 36.0–46.0)
Hemoglobin: 13.9 g/dL (ref 12.0–15.0)
MCH: 31 pg (ref 26.0–34.0)
MCHC: 32.8 g/dL (ref 30.0–36.0)
MCV: 94.6 fL (ref 78.0–100.0)
PLATELETS: 366 10*3/uL (ref 150–400)
RBC: 4.48 MIL/uL (ref 3.87–5.11)
RDW: 14.2 % (ref 11.5–15.5)
WBC: 8.8 10*3/uL (ref 4.0–10.5)

## 2015-11-23 LAB — SURGICAL PCR SCREEN
MRSA, PCR: NEGATIVE
Staphylococcus aureus: POSITIVE — AB

## 2015-11-23 NOTE — Patient Instructions (Addendum)
Vicki Turner  11/23/2015   Your procedure is scheduled on: 11-30-15  [facility]  [facility] 3rd floor to  Short Stay Center at  1000 AM.  Call this number if you have problems the morning of surgery [phone]   Remember: ONLY 1 PERSON MAY GO WITH YOU TO SHORT STAY TO GET  READY MORNING OF YOUR SURGERY.  Do not eat food or drink liquids :After Midnight. Exception (may have cup of clear liquid at 0600 AM, and no later, unless taking meds with sip water.     Take these medicines the morning of surgery with A SIP OF WATER: Loratadine. Corgard usual bedtime. DO NOT TAKE ANY DIABETIC MEDICATIONS DAY OF YOUR SURGERY                               You may not have any metal on your body including hair pins and              piercings  Do not wear jewelry, make-up, lotions, powders or perfumes, deodorant             Do not wear nail polish.  Do not shave  48 hours prior to surgery.              Men may shave face and neck.   Do not bring valuables to the hospital. [facility] IS NOT             RESPONSIBLE   FOR VALUABLES.  Contacts, dentures or bridgework may not be worn into surgery.  Leave suitcase in the car. After surgery it may be brought to your room.     Patients discharged the day of surgery will not be allowed to drive home.  Name and phone number of your driver: Vicki HuaDavid- spouse  [phone]  Special Instructions: N/A              Please read over the following fact sheets you were given: _____________________________________________________________________             [facility] - Preparing [facility] Before surgery, you can play an important role.  Because skin is not sterile, your skin needs to be as free of germs as possible.  You can reduce the number of germs on your skin by washing with CHG (chlorahexidine gluconate) soap before surgery.  CHG is an antiseptic cleaner which kills germs and bonds with the  skin to continue killing germs even after washing. Please DO NOT use if you have an allergy to CHG or antibacterial soaps.  If your skin becomes reddened/irritated stop using the CHG and inform your nurse when you arrive at Short Stay. Do not shave (including legs and underarms) for at least 48 hours prior to the first CHG shower.  You may shave your face/neck. Please follow these instructions carefully:  1.  Shower with CHG Soap the night before surgery and the  morning of Surgery.  2.  If you choose to wash your hair, wash your hair first as usual with your  normal  shampoo.  3.  After you shampoo, rinse your hair and body thoroughly to remove the  shampoo.  4.  Use CHG as you would any other liquid soap.  You can apply chg directly  to the skin and wash                       Gently with a scrungie or clean washcloth.  5.  Apply the CHG Soap to your body ONLY FROM THE NECK DOWN.   Do not use on face/ open                           Wound or open sores. Avoid contact with eyes, ears mouth and genitals (private parts).                       Wash face,  Genitals (private parts) with your normal soap.             6.  Wash thoroughly, paying special attention to the area where your surgery  will be performed.  7.  Thoroughly rinse your body with warm water from the neck down.  8.  DO NOT shower/wash with your normal soap after using and rinsing off  the CHG Soap.                9.  Pat yourself dry with a clean towel.            10.  Wear clean pajamas.            11.  Place clean sheets on your bed the night of your first shower and do not  sleep with pets. Day of Surgery : Do not apply any lotions/deodorants the morning of surgery.  Please wear clean clothes to the hospital/surgery center.  FAILURE TO FOLLOW THESE INSTRUCTIONS MAY RESULT IN THE CANCELLATION OF YOUR SURGERY PATIENT SIGNATURE_________________________________  NURSE  SIGNATURE__________________________________  ________________________________________________________________________   Adam Phenix  An incentive spirometer is a tool that can help keep your lungs clear and active. This tool measures how well you are filling your lungs with each breath. Taking long deep breaths may help reverse or decrease the chance of developing breathing (pulmonary) problems (especially infection) following:  A long period of time when you are unable to move or be active. BEFORE THE PROCEDURE   If the spirometer includes an indicator to show your best effort, your nurse or respiratory therapist will set it to a desired goal.  If possible, sit up straight or lean slightly forward. Try not to slouch.  Hold the incentive spirometer in an upright position. INSTRUCTIONS FOR USE  1. Sit on the edge of your bed if possible, or sit up as far as you can in bed or on a chair. 2. Hold the incentive spirometer in an upright position. 3. Breathe out normally. 4. Place the mouthpiece in your mouth and seal your lips tightly around it. 5. Breathe in slowly and as deeply as possible, raising the piston or the ball toward the top of the column. 6. Hold your breath for 3-5 seconds or for as long as possible. Allow the piston or ball to fall to the bottom of the column. 7. Remove the mouthpiece from your mouth and breathe out normally. 8. Rest for a few seconds and repeat Steps 1 through 7 at least 10 times every 1-2 hours when you are awake. Take your time and take a few normal breaths between deep breaths. 9. The spirometer may include an indicator to show  your best effort. Use the indicator as a goal to work toward during each repetition. 10. After each set of 10 deep breaths, practice coughing to be sure your lungs are clear. If you have an incision (the cut made at the time of surgery), support your incision when coughing by placing a pillow or rolled up towels firmly  against it. Once you are able to get out of bed, walk around indoors and cough well. You may stop using the incentive spirometer when instructed by your caregiver.  RISKS AND COMPLICATIONS  Take your time so you do not get dizzy or light-headed.  If you are in pain, you may need to take or ask for pain medication before doing incentive spirometry. It is harder to take a deep breath if you are having pain. AFTER USE  Rest and breathe slowly and easily.  It can be helpful to keep track of a log of your progress. Your caregiver can provide you with a simple table to help with this. If you are using the spirometer at home, follow these instructions: [place] IF:   You are having difficultly using the spirometer.  You have trouble using the spirometer as often as instructed.  Your pain medication is not giving enough relief while using the spirometer.  You develop fever of 100.5 F (38.1 C) or higher. SEEK IMMEDIATE MEDICAL CARE IF:   You cough up bloody sputum that had not been present before.  You develop fever of 102 F (38.9 C) or greater.  You develop worsening pain at or near the incision site. MAKE SURE YOU:   Understand these instructions.  Will watch your condition.  Will get help right away if you are not doing well or get worse. Document Released: 06/19/2006 Document Revised: 05/01/2011 Document Reviewed: 08/20/2006 ExitCare Patient Information 2014 ExitCare, [place].   ________________________________________________________________________  WHAT IS A BLOOD TRANSFUSION? Blood Transfusion Information  A transfusion is the replacement of blood or some of its parts. Blood is made up of multiple cells which provide different functions.  Red blood cells carry oxygen and are used for blood loss replacement.  White blood cells fight against infection.  Platelets control bleeding.  Plasma helps clot blood.  Other blood products are available for  specialized needs, such as hemophilia or other clotting disorders. BEFORE THE TRANSFUSION  Who gives blood for transfusions?   Healthy volunteers who are fully evaluated to make sure their blood is safe. This is blood bank blood. Transfusion therapy is the safest it has ever been in the practice of medicine. Before blood is taken from a donor, a complete history is taken to make sure that person has no history of diseases nor engages in risky social behavior (examples are intravenous drug use or sexual activity with multiple partners). The donor's travel history is screened to minimize risk of transmitting infections, such as malaria. The donated blood is tested for signs of infectious diseases, such as HIV and hepatitis. The blood is then tested to be sure it is compatible with you in order to minimize the chance of a transfusion reaction. If you or a relative donates blood, this is often done in anticipation of surgery and is not appropriate for emergency situations. It takes many days to process the donated blood. RISKS AND COMPLICATIONS Although transfusion therapy is very safe and saves many lives, the main dangers of transfusion include:   Getting an infectious disease.  Developing a transfusion reaction. This is an allergic reaction to  something in the blood you were given. Every precaution is taken to prevent this. The decision to have a blood transfusion has been considered carefully by your caregiver before blood is given. Blood is not given unless the benefits outweigh the risks. AFTER THE TRANSFUSION  Right after receiving a blood transfusion, you will usually feel much better and more energetic. This is especially true if your red blood cells have gotten low (anemic). The transfusion raises the level of the red blood cells which carry oxygen, and this usually causes an energy increase.  The nurse administering the transfusion will monitor you carefully for complications. HOME CARE  INSTRUCTIONS  No special instructions are needed after a transfusion. You may find your energy is better. Speak with your caregiver about any limitations on activity for underlying diseases you may have. SEEK MEDICAL CARE IF:   Your condition is not improving after your transfusion.  You develop redness or irritation at the intravenous (IV) site. SEEK IMMEDIATE MEDICAL CARE IF:  Any of the following symptoms occur over the next 12 hours:  Shaking chills.  You have a temperature by mouth above 102 F (38.9 C), not controlled by medicine.  Chest, back, or muscle pain.  People around you feel you are not acting correctly or are confused.  Shortness of breath or difficulty breathing.  Dizziness and fainting.  You get a rash or develop hives.  You have a decrease in urine output.  Your urine turns a dark color or changes to pink, red, or brown. Any of the following symptoms occur over the next 10 days:  You have a temperature by mouth above 102 F (38.9 C), not controlled by medicine.  Shortness of breath.  Weakness after normal activity.  The white part of the eye turns yellow (jaundice).  You have a decrease in the amount of urine or are urinating less often.  Your urine turns a dark color or changes to pink, red, or brown. Document Released: 02/04/2000 Document Revised: 05/01/2011 Document Reviewed: 09/23/2007 [facility] Patient Information 2014 [place], [place].  _______________________________________________________________________

## 2015-11-23 NOTE — Progress Notes (Signed)
11-23-15 1630 Note labs viewable in Epic, note Bun.

## 2015-11-23 NOTE — Pre-Procedure Instructions (Signed)
EKG 9'17 with chart. Clearance note 9'17-Dr. Little,PCP with chart, and LOV notes.

## 2015-11-23 NOTE — Pre-Procedure Instructions (Signed)
11-23-15 1740 Positive Staph aureus, Rx for Mupirocin called to [facility], pt left voice message to pick RX to use as directed. Note Sent to Dr. Nilsa Nuttinglin's office .

## 2015-11-24 LAB — ABO/RH: ABO/RH(D): O POS

## 2015-11-30 ENCOUNTER — Encounter (HOSPITAL_COMMUNITY)
Admission: RE | Disposition: A | Discharge status: Home / Self Care | Payer: Self-pay | Source: Ambulatory Visit | Attending: Orthopedic Surgery

## 2015-11-30 ENCOUNTER — Inpatient Hospital Stay (HOSPITAL_COMMUNITY): Payer: Medicare Other | Admitting: Anesthesiology

## 2015-11-30 ENCOUNTER — Inpatient Hospital Stay (HOSPITAL_COMMUNITY)
Admission: RE | Admit: 2015-11-30 | Discharge: 2015-12-01 | DRG: 470 | Disposition: A | Discharge status: Home Health | Payer: Medicare Other | Source: Ambulatory Visit | Attending: Orthopedic Surgery | Admitting: Orthopedic Surgery

## 2015-11-30 ENCOUNTER — Encounter (HOSPITAL_COMMUNITY): Payer: Self-pay

## 2015-11-30 DIAGNOSIS — E663 Overweight: Secondary | ICD-10-CM | POA: Diagnosis present

## 2015-11-30 DIAGNOSIS — Z90710 Acquired absence of both cervix and uterus: Secondary | ICD-10-CM

## 2015-11-30 DIAGNOSIS — I10 Essential (primary) hypertension: Secondary | ICD-10-CM | POA: Diagnosis present

## 2015-11-30 DIAGNOSIS — Z6825 Body mass index (BMI) 25.0-25.9, adult: Secondary | ICD-10-CM | POA: Diagnosis not present

## 2015-11-30 DIAGNOSIS — M659 Synovitis and tenosynovitis, unspecified: Secondary | ICD-10-CM | POA: Diagnosis present

## 2015-11-30 DIAGNOSIS — Z9889 Other specified postprocedural states: Secondary | ICD-10-CM | POA: Diagnosis not present

## 2015-11-30 DIAGNOSIS — Z96652 Presence of left artificial knee joint: Secondary | ICD-10-CM

## 2015-11-30 DIAGNOSIS — M8580 Other specified disorders of bone density and structure, unspecified site: Secondary | ICD-10-CM | POA: Diagnosis present

## 2015-11-30 DIAGNOSIS — Z8249 Family history of ischemic heart disease and other diseases of the circulatory system: Secondary | ICD-10-CM

## 2015-11-30 DIAGNOSIS — Z96659 Presence of unspecified artificial knee joint: Secondary | ICD-10-CM

## 2015-11-30 DIAGNOSIS — M1712 Unilateral primary osteoarthritis, left knee: Secondary | ICD-10-CM | POA: Diagnosis present

## 2015-11-30 DIAGNOSIS — Z888 Allergy status to other drugs, medicaments and biological substances status: Secondary | ICD-10-CM

## 2015-11-30 HISTORY — PX: TOTAL KNEE ARTHROPLASTY: SHX125

## 2015-11-30 LAB — TYPE AND SCREEN
ABO/RH(D): O POS
Antibody Screen: NEGATIVE

## 2015-11-30 SURGERY — ARTHROPLASTY, KNEE, TOTAL
Anesthesia: Spinal | Site: Knee | Laterality: Left

## 2015-11-30 MED ORDER — DOCUSATE SODIUM 100 MG PO CAPS
100.0000 mg | ORAL_CAPSULE | Freq: Two times a day (BID) | ORAL | Status: DC
  Administered 2015-11-30 – 2015-12-01 (×2): 100 mg via ORAL
  Filled 2015-11-30 (×2): qty 1

## 2015-11-30 MED ORDER — EPHEDRINE 5 MG/ML INJ
INTRAVENOUS | Status: AC
  Filled 2015-11-30: qty 10

## 2015-11-30 MED ORDER — ONDANSETRON HCL 4 MG/2ML IJ SOLN: 4.0000 mg | Freq: Four times a day (QID) | INTRAMUSCULAR | Status: DC | PRN

## 2015-11-30 MED ORDER — SODIUM CHLORIDE 0.9 % IJ SOLN
INTRAMUSCULAR | Status: AC
  Filled 2015-11-30: qty 50

## 2015-11-30 MED ORDER — 0.9 % SODIUM CHLORIDE (POUR BTL) OPTIME
TOPICAL | Status: DC | PRN
  Administered 2015-11-30: 1000 mL

## 2015-11-30 MED ORDER — SODIUM CHLORIDE 0.9 % IJ SOLN
INTRAMUSCULAR | Status: DC | PRN
  Administered 2015-11-30: 29 mL

## 2015-11-30 MED ORDER — CEFAZOLIN SODIUM-DEXTROSE 2-4 GM/100ML-% IV SOLN
2.0000 g | Freq: Four times a day (QID) | INTRAVENOUS | Status: AC
  Administered 2015-11-30 – 2015-12-01 (×2): 2 g via INTRAVENOUS
  Filled 2015-11-30: qty 100

## 2015-11-30 MED ORDER — PROPOFOL 10 MG/ML IV BOLUS
INTRAVENOUS | Status: DC | PRN
  Administered 2015-11-30: 30 mg via INTRAVENOUS

## 2015-11-30 MED ORDER — CHLORHEXIDINE GLUCONATE 4 % EX LIQD: 60.0000 mL | Freq: Once | CUTANEOUS | Status: DC

## 2015-11-30 MED ORDER — MIDAZOLAM HCL 2 MG/2ML IJ SOLN
INTRAMUSCULAR | Status: AC
  Filled 2015-11-30: qty 2

## 2015-11-30 MED ORDER — CEFAZOLIN SODIUM-DEXTROSE 2-4 GM/100ML-% IV SOLN
2.0000 g | INTRAVENOUS | Status: AC
  Administered 2015-11-30: 2 g via INTRAVENOUS
  Filled 2015-11-30: qty 100

## 2015-11-30 MED ORDER — SODIUM CHLORIDE 0.9 % IV SOLN
INTRAVENOUS | Status: DC
  Administered 2015-11-30 – 2015-12-01 (×2): via INTRAVENOUS
  Filled 2015-11-30 (×6): qty 1000

## 2015-11-30 MED ORDER — ASPIRIN 81 MG PO CHEW
81.0000 mg | CHEWABLE_TABLET | Freq: Two times a day (BID) | ORAL | Status: DC
  Administered 2015-11-30 – 2015-12-01 (×2): 81 mg via ORAL
  Filled 2015-11-30 (×2): qty 1

## 2015-11-30 MED ORDER — GLYCOPYRROLATE 0.2 MG/ML IV SOSY
PREFILLED_SYRINGE | INTRAVENOUS | Status: AC
  Filled 2015-11-30: qty 3

## 2015-11-30 MED ORDER — METHOCARBAMOL 500 MG PO TABS
500.0000 mg | ORAL_TABLET | Freq: Four times a day (QID) | ORAL | Status: DC | PRN
  Administered 2015-12-01 (×2): 500 mg via ORAL
  Filled 2015-11-30 (×2): qty 1

## 2015-11-30 MED ORDER — PROPOFOL 10 MG/ML IV BOLUS
INTRAVENOUS | Status: AC
  Filled 2015-11-30: qty 20

## 2015-11-30 MED ORDER — PHENYLEPHRINE HCL 10 MG/ML IJ SOLN
INTRAMUSCULAR | Status: AC
  Filled 2015-11-30: qty 1

## 2015-11-30 MED ORDER — BUPIVACAINE HCL (PF) 0.25 % IJ SOLN
INTRAMUSCULAR | Status: DC | PRN
  Administered 2015-11-30: 30 mL

## 2015-11-30 MED ORDER — PHENYLEPHRINE HCL 10 MG/ML IJ SOLN
INTRAVENOUS | Status: DC | PRN
  Administered 2015-11-30: 40 ug/min via INTRAVENOUS

## 2015-11-30 MED ORDER — BUPIVACAINE IN DEXTROSE 0.75-8.25 % IT SOLN
INTRATHECAL | Status: DC | PRN
  Administered 2015-11-30: 2 mL via INTRATHECAL

## 2015-11-30 MED ORDER — BUPIVACAINE HCL (PF) 0.25 % IJ SOLN
INTRAMUSCULAR | Status: AC
  Filled 2015-11-30: qty 30

## 2015-11-30 MED ORDER — MAGNESIUM CITRATE PO SOLN: 1.0000 | Freq: Once | ORAL | Status: DC | PRN

## 2015-11-30 MED ORDER — ROPIVACAINE HCL 7.5 MG/ML IJ SOLN
INTRAMUSCULAR | Status: AC
  Filled 2015-11-30: qty 20

## 2015-11-30 MED ORDER — METOCLOPRAMIDE HCL 5 MG PO TABS: 5.0000 mg | ORAL_TABLET | Freq: Three times a day (TID) | ORAL | Status: DC | PRN

## 2015-11-30 MED ORDER — FERROUS SULFATE 325 (65 FE) MG PO TABS
325.0000 mg | ORAL_TABLET | Freq: Three times a day (TID) | ORAL | Status: DC
  Administered 2015-12-01 (×2): 325 mg via ORAL
  Filled 2015-11-30 (×2): qty 1

## 2015-11-30 MED ORDER — TRANEXAMIC ACID 1000 MG/10ML IV SOLN
1000.0000 mg | INTRAVENOUS | Status: AC
  Administered 2015-11-30: 1000 mg via INTRAVENOUS
  Filled 2015-11-30: qty 10

## 2015-11-30 MED ORDER — MENTHOL 3 MG MT LOZG: 1.0000 | LOZENGE | OROMUCOSAL | Status: DC | PRN

## 2015-11-30 MED ORDER — LABETALOL HCL 5 MG/ML IV SOLN
INTRAVENOUS | Status: DC | PRN
  Administered 2015-11-30: 2.5 mg via INTRAVENOUS

## 2015-11-30 MED ORDER — PROPOFOL 10 MG/ML IV BOLUS
INTRAVENOUS | Status: AC
  Filled 2015-11-30: qty 60

## 2015-11-30 MED ORDER — ONDANSETRON HCL 4 MG PO TABS: 4.0000 mg | ORAL_TABLET | Freq: Four times a day (QID) | ORAL | Status: DC | PRN

## 2015-11-30 MED ORDER — SODIUM CHLORIDE 0.9 % IR SOLN
Status: DC | PRN
  Administered 2015-11-30: 1000 mL

## 2015-11-30 MED ORDER — PHENOL 1.4 % MT LIQD
1.0000 | OROMUCOSAL | Status: DC | PRN
  Filled 2015-11-30: qty 177

## 2015-11-30 MED ORDER — LACTATED RINGERS IV SOLN
INTRAVENOUS | Status: DC
  Administered 2015-11-30: 1000 mL via INTRAVENOUS
  Administered 2015-11-30 (×2): via INTRAVENOUS

## 2015-11-30 MED ORDER — HYDROMORPHONE HCL 1 MG/ML IJ SOLN
0.5000 mg | INTRAMUSCULAR | Status: DC | PRN
  Administered 2015-11-30: 1 mg via INTRAVENOUS
  Administered 2015-11-30: 0.5 mg via INTRAVENOUS
  Administered 2015-12-01 (×2): 1 mg via INTRAVENOUS
  Filled 2015-11-30 (×4): qty 1

## 2015-11-30 MED ORDER — METHOCARBAMOL 1000 MG/10ML IJ SOLN
500.0000 mg | Freq: Four times a day (QID) | INTRAVENOUS | Status: DC | PRN
  Filled 2015-11-30: qty 5

## 2015-11-30 MED ORDER — CELECOXIB 200 MG PO CAPS
200.0000 mg | ORAL_CAPSULE | Freq: Two times a day (BID) | ORAL | Status: DC
  Administered 2015-11-30 – 2015-12-01 (×2): 200 mg via ORAL
  Filled 2015-11-30 (×2): qty 1

## 2015-11-30 MED ORDER — ALUM & MAG HYDROXIDE-SIMETH 200-200-20 MG/5ML PO SUSP: 30.0000 mL | ORAL | Status: DC | PRN

## 2015-11-30 MED ORDER — FENTANYL CITRATE (PF) 100 MCG/2ML IJ SOLN
INTRAMUSCULAR | Status: DC | PRN
  Administered 2015-11-30 (×2): 50 ug via INTRAVENOUS

## 2015-11-30 MED ORDER — DIPHENHYDRAMINE HCL 25 MG PO CAPS: 25.0000 mg | ORAL_CAPSULE | Freq: Four times a day (QID) | ORAL | Status: DC | PRN

## 2015-11-30 MED ORDER — HYDROCODONE-ACETAMINOPHEN 7.5-325 MG PO TABS
1.0000 | ORAL_TABLET | ORAL | Status: DC
  Administered 2015-11-30: 1 via ORAL
  Administered 2015-11-30 – 2015-12-01 (×3): 2 via ORAL
  Administered 2015-12-01: 1 via ORAL
  Administered 2015-12-01 (×2): 2 via ORAL
  Filled 2015-11-30 (×5): qty 2
  Filled 2015-11-30 (×2): qty 1
  Filled 2015-11-30: qty 2

## 2015-11-30 MED ORDER — MIDAZOLAM HCL 5 MG/5ML IJ SOLN
INTRAMUSCULAR | Status: DC | PRN
  Administered 2015-11-30: 2 mg via INTRAVENOUS

## 2015-11-30 MED ORDER — DEXAMETHASONE SODIUM PHOSPHATE 10 MG/ML IJ SOLN
10.0000 mg | Freq: Once | INTRAMUSCULAR | Status: AC
  Administered 2015-12-01: 10 mg via INTRAVENOUS
  Filled 2015-11-30: qty 1

## 2015-11-30 MED ORDER — PROPOFOL 500 MG/50ML IV EMUL
INTRAVENOUS | Status: DC | PRN
  Administered 2015-11-30: 50 ug/kg/min via INTRAVENOUS

## 2015-11-30 MED ORDER — FLUTICASONE PROPIONATE 50 MCG/ACT NA SUSP
1.0000 | Freq: Every day | NASAL | Status: DC | PRN
  Filled 2015-11-30: qty 16

## 2015-11-30 MED ORDER — EPHEDRINE SULFATE-NACL 50-0.9 MG/10ML-% IV SOSY
PREFILLED_SYRINGE | INTRAVENOUS | Status: DC | PRN
  Administered 2015-11-30 (×2): 5 mg via INTRAVENOUS

## 2015-11-30 MED ORDER — NADOLOL 20 MG PO TABS
20.0000 mg | ORAL_TABLET | Freq: Every evening | ORAL | Status: DC
  Administered 2015-11-30 – 2015-12-01 (×2): 20 mg via ORAL
  Filled 2015-11-30 (×2): qty 1

## 2015-11-30 MED ORDER — ROPIVACAINE HCL 7.5 MG/ML IJ SOLN
INTRAMUSCULAR | Status: DC | PRN
  Administered 2015-11-30: 20 mL via PERINEURAL

## 2015-11-30 MED ORDER — LORATADINE 10 MG PO TABS
10.0000 mg | ORAL_TABLET | Freq: Every day | ORAL | Status: DC
  Administered 2015-12-01: 10 mg via ORAL
  Filled 2015-11-30: qty 1

## 2015-11-30 MED ORDER — STERILE WATER FOR IRRIGATION IR SOLN
Status: DC | PRN
  Administered 2015-11-30: 2000 mL

## 2015-11-30 MED ORDER — KETOROLAC TROMETHAMINE 30 MG/ML IJ SOLN
INTRAMUSCULAR | Status: AC
Start: 2015-11-30 — End: 2015-11-30
  Filled 2015-11-30: qty 1

## 2015-11-30 MED ORDER — SUMATRIPTAN SUCCINATE 100 MG PO TABS
100.0000 mg | ORAL_TABLET | ORAL | Status: DC | PRN
  Filled 2015-11-30: qty 1

## 2015-11-30 MED ORDER — GABAPENTIN 100 MG PO CAPS
100.0000 mg | ORAL_CAPSULE | Freq: Every day | ORAL | Status: DC
  Administered 2015-11-30: 100 mg via ORAL
  Filled 2015-11-30: qty 1

## 2015-11-30 MED ORDER — ONDANSETRON HCL 4 MG/2ML IJ SOLN
INTRAMUSCULAR | Status: AC
  Filled 2015-11-30: qty 2

## 2015-11-30 MED ORDER — ONDANSETRON HCL 4 MG/2ML IJ SOLN
INTRAMUSCULAR | Status: DC | PRN
  Administered 2015-11-30: 4 mg via INTRAVENOUS

## 2015-11-30 MED ORDER — CEFAZOLIN SODIUM-DEXTROSE 2-4 GM/100ML-% IV SOLN
INTRAVENOUS | Status: AC
  Filled 2015-11-30: qty 100

## 2015-11-30 MED ORDER — LABETALOL HCL 5 MG/ML IV SOLN
INTRAVENOUS | Status: AC
  Filled 2015-11-30: qty 4

## 2015-11-30 MED ORDER — FENTANYL CITRATE (PF) 100 MCG/2ML IJ SOLN
INTRAMUSCULAR | Status: AC
  Filled 2015-11-30: qty 2

## 2015-11-30 MED ORDER — BISACODYL 10 MG RE SUPP: 10.0000 mg | Freq: Every day | RECTAL | Status: DC | PRN

## 2015-11-30 MED ORDER — METOCLOPRAMIDE HCL 5 MG/ML IJ SOLN: 5.0000 mg | Freq: Three times a day (TID) | INTRAMUSCULAR | Status: DC | PRN

## 2015-11-30 MED ORDER — KETOROLAC TROMETHAMINE 30 MG/ML IJ SOLN
INTRAMUSCULAR | Status: DC | PRN
  Administered 2015-11-30: 30 mg

## 2015-11-30 MED ORDER — POLYETHYLENE GLYCOL 3350 17 G PO PACK
17.0000 g | PACK | Freq: Two times a day (BID) | ORAL | Status: DC
  Administered 2015-11-30 – 2015-12-01 (×2): 17 g via ORAL
  Filled 2015-11-30 (×2): qty 1

## 2015-11-30 MED ORDER — DEXAMETHASONE SODIUM PHOSPHATE 10 MG/ML IJ SOLN
INTRAMUSCULAR | Status: AC
  Filled 2015-11-30: qty 1

## 2015-11-30 MED ORDER — DEXAMETHASONE SODIUM PHOSPHATE 10 MG/ML IJ SOLN
10.0000 mg | Freq: Once | INTRAMUSCULAR | Status: AC
  Administered 2015-11-30: 10 mg via INTRAVENOUS

## 2015-11-30 SURGICAL SUPPLY — 47 items
ADH SKN CLS APL DERMABOND .7 (GAUZE/BANDAGES/DRESSINGS) ×1
BAG SPEC THK2 15X12 ZIP CLS (MISCELLANEOUS) ×1
BAG ZIPLOCK 12X15 (MISCELLANEOUS) ×2 IMPLANT
BANDAGE ACE 6X5 VEL STRL LF (GAUZE/BANDAGES/DRESSINGS) ×3 IMPLANT
BLADE SAW SGTL 13.0X1.19X90.0M (BLADE) ×3 IMPLANT
BOWL SMART MIX CTS (DISPOSABLE) ×3 IMPLANT
CAPT KNEE TOTAL 3 ATTUNE ×2 IMPLANT
CEMENT HV SMART SET (Cement) ×4 IMPLANT
CLOTH BEACON ORANGE TIMEOUT ST (SAFETY) ×3 IMPLANT
CUFF TOURN SGL QUICK 34 (TOURNIQUET CUFF) ×3
CUFF TRNQT CYL 34X4X40X1 (TOURNIQUET CUFF) ×1 IMPLANT
DECANTER SPIKE VIAL GLASS SM (MISCELLANEOUS) ×3 IMPLANT
DERMABOND ADVANCED (GAUZE/BANDAGES/DRESSINGS) ×2
DERMABOND ADVANCED .7 DNX12 (GAUZE/BANDAGES/DRESSINGS) ×1 IMPLANT
DRAPE U-SHAPE 47X51 STRL (DRAPES) ×3 IMPLANT
DRESSING AQUACEL AG SP 3.5X10 (GAUZE/BANDAGES/DRESSINGS) ×1 IMPLANT
DRSG AQUACEL AG SP 3.5X10 (GAUZE/BANDAGES/DRESSINGS) ×3
DURAPREP 26ML APPLICATOR (WOUND CARE) ×6 IMPLANT
ELECT REM PT RETURN 9FT ADLT (ELECTROSURGICAL) ×3
ELECTRODE REM PT RTRN 9FT ADLT (ELECTROSURGICAL) ×1 IMPLANT
GLOVE BIOGEL M 7.0 STRL (GLOVE) ×2 IMPLANT
GLOVE BIOGEL M STRL SZ7.5 (GLOVE) ×2 IMPLANT
GLOVE BIOGEL PI IND STRL 6.5 (GLOVE) IMPLANT
GLOVE BIOGEL PI IND STRL 7.5 (GLOVE) ×1 IMPLANT
GLOVE BIOGEL PI INDICATOR 6.5 (GLOVE) ×2
GLOVE BIOGEL PI INDICATOR 7.5 (GLOVE) ×10
GLOVE ORTHO TXT STRL SZ7.5 (GLOVE) ×6 IMPLANT
GLOVE SURG SS PI 7.0 STRL IVOR (GLOVE) ×2 IMPLANT
GLOVE SURG SS PI 7.5 STRL IVOR (GLOVE) ×2 IMPLANT
GOWN STRL REUS W/ TWL XL LVL3 (GOWN DISPOSABLE) IMPLANT
GOWN STRL REUS W/TWL LRG LVL3 (GOWN DISPOSABLE) ×5 IMPLANT
GOWN STRL REUS W/TWL XL LVL3 (GOWN DISPOSABLE) ×6 IMPLANT
HANDPIECE INTERPULSE COAX TIP (DISPOSABLE) ×3
MANIFOLD NEPTUNE II (INSTRUMENTS) ×3 IMPLANT
PACK TOTAL KNEE CUSTOM (KITS) ×3 IMPLANT
POSITIONER SURGICAL ARM (MISCELLANEOUS) ×3 IMPLANT
SET HNDPC FAN SPRY TIP SCT (DISPOSABLE) ×1 IMPLANT
SET PAD KNEE POSITIONER (MISCELLANEOUS) ×3 IMPLANT
SUT MNCRL AB 4-0 PS2 18 (SUTURE) ×3 IMPLANT
SUT VIC AB 1 CT1 36 (SUTURE) ×3 IMPLANT
SUT VIC AB 2-0 CT1 27 (SUTURE) ×9
SUT VIC AB 2-0 CT1 TAPERPNT 27 (SUTURE) ×3 IMPLANT
SUT VLOC 180 0 24IN GS25 (SUTURE) ×3 IMPLANT
SYR 50ML LL SCALE MARK (SYRINGE) ×3 IMPLANT
TRAY FOLEY CATH SILVER 14FR (SET/KITS/TRAYS/PACK) ×2 IMPLANT
WRAP KNEE MAXI GEL POST OP (GAUZE/BANDAGES/DRESSINGS) ×3 IMPLANT
YANKAUER SUCT BULB TIP 10FT TU (MISCELLANEOUS) ×3 IMPLANT

## 2015-11-30 NOTE — Op Note (Signed)
NAME:  Vicki Turner                      MEDICAL RECORD NO.:  [id number]                             FACILITY[facility]      PHYSICIAN:  Madlyn FrankelMatthew D. Charlann Boxerlin, M.D.  DATE OF BIRTH:  May 15, 1950      DATE OF PROCEDURE:  11/30/2015                                     OPERATIVE REPORT         PREOPERATIVE DIAGNOSIS:  Left knee osteoarthritis.      POSTOPERATIVE DIAGNOSIS:  Left knee osteoarthritis.      FINDINGS:  The patient was noted to have complete loss of cartilage and   bone-on-bone arthritis with associated osteophytes in the medial and patellofemoral compartments of   the knee with a significant synovitis and associated effusion.      PROCEDURE:  Left total knee replacement.      COMPONENTS USED:  DePuy Attune rotating platform posterior stabilized knee   system, a size 5N femur, 3 tibia, size 6 PS AOX insert, and 32 anatomic patellar   button.      SURGEON:  Madlyn FrankelMatthew D. Charlann Boxerlin, M.D.      ASSISTANT:  Skip MayerBlair Roberts, PA-C.      ANESTHESIA:  Regional and Spinal.      SPECIMENS:  None.      COMPLICATION:  None.      DRAINS:  None.  EBL: <150cc      TOURNIQUET TIME:   Total Tourniquet Time Documented: Thigh (Left) - 33 minutes Total: Thigh (Left) - 33 minutes  .      The patient was stable to the recovery room.      INDICATION FOR PROCEDURE:  Vicki Turner is a 65 y.o. female patient of   mine.  The patient had been seen, evaluated, and treated conservatively in the   office with medication, activity modification, and injections.  The patient had   radiographic changes of bone-on-bone arthritis with endplate sclerosis and osteophytes noted.      The patient failed conservative measures including medication, injections, and activity modification, and at this point was ready for more definitive measures.   Based on the radiographic changes and failed conservative measures, the patient   decided to proceed with total knee replacement.  Risks of infection,   DVT, component  failure, need for revision surgery, postop course, and   expectations were all   discussed and reviewed.  Consent was obtained for benefit of pain   relief.      PROCEDURE IN DETAIL:  The patient was brought to the operative theater.   Once adequate anesthesia, preoperative antibiotics, 2 gm of Ancef, 1 gm of Tranexamic Acid, and 10 mg of Decadron administered, the patient was positioned supine with the left thigh tourniquet placed.  The  left lower extremity was prepped and draped in sterile fashion.  A time-   out was performed identifying the patient, planned procedure, and   extremity.      The left lower extremity was placed in the [facility] leg holder.  The leg was   exsanguinated, tourniquet elevated to 250 mmHg.  A midline incision was  made followed by median parapatellar arthrotomy.  Following initial   exposure, attention was first directed to the patella.  Precut   measurement was noted to be 21 mm.  I resected down to 13 mm and used a   32 anatomic patellar button to restore patellar height as well as cover the cut   surface.      The lug holes were drilled and a metal shim was placed to protect the   patella from retractors and saw blades.      At this point, attention was now directed to the femur.  The femoral   canal was opened with a drill, irrigated to try to prevent fat emboli.  An   intramedullary rod was passed at 3 degrees valgus, 9 mm of bone was   resected off the distal femur.  Following this resection, the tibia was   subluxated anteriorly.  Using the extramedullary guide, 2 mm of bone was resected off   the proximal medial tibia.  We confirmed the gap would be   stable medially and laterally with a size 5 spacer gap as well as confirmed   the cut was perpendicular in the coronal plane, checking with an alignment rod.      Once this was done, I sized the femur to be a size 5 in the anterior-   posterior dimension, chose a narrow component based on medial and    lateral dimension.  The size 5 rotation block was then pinned in   position anterior referenced using the C-clamp to set rotation.  The   anterior, posterior, and  chamfer cuts were made without difficulty nor   notching making certain that I was along the anterior cortex to help   with flexion gap stability.      The final box cut was made off the lateral aspect of distal femur.      At this point, the tibia was sized to be a size 3, the size 3 tray was   then pinned in position through the medial third of the tubercle,   drilled, and keel punched.  Trial reduction was now carried with a 5 femur,  3 tibia, a size 6 PS insert, and the 32 anatomic patella botton.  The knee was brought to   extension, full extension with good flexion stability with the patella   tracking through the trochlea without application of pressure.  Given   all these findings the femoral lug holes were drilled and then the trial components removed.  Final components were   opened and cement was mixed.  The knee was irrigated with normal saline   solution and pulse lavage.  The synovial lining was   then injected with 30 cc of 0.25% Marcaine without epinephrine and 1 cc of Toradol plus 30 cc of NS for a    total of 61 cc.      The knee was irrigated.  Final implants were then cemented onto clean and   dried cut surfaces of bone with the knee brought to extension with a size 6 PS trial insert.      Once the cement had fully cured, the excess cement was removed   throughout the knee.  I confirmed I was satisfied with the range of   motion and stability, and the final size 6 PS AOX insert was chosen.  It was   placed into the knee.      The tourniquet had been let down at  32 minutes.  When the tourniquet had been released and after the knee had been flexed for cementing when I removed the trial poly I recognized 2 small diameter pulsatile vessels.  I was able to cauterize them using a tonsil clamp.  These appeared  to be small branches in the vicinity of the cruciate ligaments based on location.  Once these were addressed the knee was as normal in appearance as is typically seen at this point.  The   extensor mechanism was then reapproximated using #1 Vicryl and #1 Sratafix sutures with the knee   in flexion.  The   remaining wound was closed with 2-0 Vicryl and running 4-0 Monocryl.   The knee was cleaned, dried, dressed sterilely using Dermabond and   Aquacel dressing.  The patient was then   brought to recovery room in stable condition, tolerating the procedure   well.   Please note that Physician Assistant, Skip Mayer, PA-C, was present for the entirety of the case, and was utilized for pre-operative positioning, peri-operative retractor management, general facilitation of the procedure.  He was also utilized for primary wound closure at the end of the case.              Madlyn Frankel Charlann Boxer, M.D.    11/30/2015 2:28 PM

## 2015-11-30 NOTE — Anesthesia Preprocedure Evaluation (Signed)
Anesthesia Evaluation  Patient identified by MRN, date of birth, ID band Patient awake    Reviewed: Allergy & Precautions, NPO status , Patient's Chart, lab work & pertinent test results  History of Anesthesia Complications (+) Family history of anesthesia reaction  Airway Mallampati: II  TM Distance: >3 FB Neck ROM: Full    Dental no notable dental hx.    Pulmonary neg pulmonary ROS,    Pulmonary exam normal breath sounds clear to auscultation       Cardiovascular hypertension, Pt. on medications Normal cardiovascular exam Rhythm:Regular Rate:Normal     Neuro/Psych  Headaches, negative psych ROS   GI/Hepatic negative GI ROS, Neg liver ROS,   Endo/Other  negative endocrine ROS  Renal/GU negative Renal ROS     Musculoskeletal negative musculoskeletal ROS (+)   Abdominal   Peds  Hematology negative hematology ROS (+)   Anesthesia Other Findings   Reproductive/Obstetrics negative OB ROS                            Anesthesia Physical Anesthesia Plan  ASA: II  Anesthesia Plan: Spinal   Post-op Pain Management:    Induction: Intravenous  Airway Management Planned:   Additional Equipment:   Intra-op Plan:   Post-operative Plan:   Informed Consent: I have reviewed the patients History and Physical, chart, labs and discussed the procedure including the risks, benefits and alternatives for the proposed anesthesia with the patient or authorized representative who has indicated his/her understanding and acceptance.   Dental advisory given  Plan Discussed with: CRNA  Anesthesia Plan Comments:         Anesthesia Quick Evaluation

## 2015-11-30 NOTE — Anesthesia Procedure Notes (Deleted)
Spinal

## 2015-11-30 NOTE — Progress Notes (Signed)
AssistedDr. Germeroth with left, ultrasound guided, adductor canal block. Side rails up, monitors on throughout procedure. See vital signs in flow sheet. Tolerated Procedure well.  

## 2015-11-30 NOTE — Anesthesia Postprocedure Evaluation (Signed)
Anesthesia Post Note  Patient: Vicki Turner  Procedure(s) Performed: Procedure(s) (LRB): LEFT TOTAL KNEE ARTHROPLASTY (Left)  Patient location during evaluation: PACU Anesthesia Type: Spinal and Regional Level of consciousness: awake and alert Pain management: pain level controlled Vital Signs Assessment: post-procedure vital signs reviewed and stable Respiratory status: spontaneous breathing and respiratory function stable Cardiovascular status: blood pressure returned to baseline and stable Postop Assessment: spinal receding Anesthetic complications: no    Last Vitals:  Vitals:   11/30/15 1734 11/30/15 1852  BP: 124/62 119/68  Pulse: (!) 58 (!) 58  Resp: 14 15  Temp: 36.6 C 36.4 C    Last Pain:  Vitals:   11/30/15 1852  TempSrc: Oral  PainSc:                  Lewie LoronJohn Hamsa Laurich

## 2015-11-30 NOTE — Interval H&P Note (Signed)
History and Physical Interval Note:  11/30/2015 11:22 AM  [name]  has presented today for surgery, with the diagnosis of LEFT KNEE OA  The various methods of treatment have been discussed with the patient and family. After consideration of risks, benefits and other options for treatment, the patient has consented to  Procedure(s): LEFT TOTAL KNEE ARTHROPLASTY (Left) as a surgical intervention .  The patient's history has been reviewed, patient examined, no change in status, stable for surgery.  I have reviewed the patient's chart and labs.  Questions were answered to the patient's satisfaction.     Shelda PalLIN,Rhonda Vangieson D

## 2015-11-30 NOTE — Anesthesia Procedure Notes (Signed)
Anesthesia Regional Block:  Adductor canal block  Pre-Anesthetic Checklist: ,, timeout performed, Correct Patient, Correct Site, Correct Laterality, Correct Procedure, Correct Position, site marked, Risks and benefits discussed, Surgical consent,  Pre-op evaluation,  Post-op pain management  Laterality: Left  Prep: chloraprep       Needles:  Injection technique: Single-shot  Needle Type: Stimiplex     Needle Length: 9cm 9 cm Needle Gauge: 21 and 21 G    Additional Needles:  Procedures: ultrasound guided (picture in chart) Adductor canal block Narrative:  Injection made incrementally with aspirations every 5 mL.  Performed by: Personally  Anesthesiologist: Kelsy Polack  Additional Notes: BP cuff, EKG monitors applied. Sedation begun. Artery and nerve location verified with U/S and anesthetic injected incrementally, slowly, and after negative aspirations under direct u/s guidance. Good fascial /perineural spread. Tolerated well.      

## 2015-11-30 NOTE — Anesthesia Procedure Notes (Signed)
Spinal  Patient location during procedure: OR Start time: 11/30/2015 12:50 PM End time: 11/30/2015 12:55 PM Reason for block: at surgeon's request Staffing Resident/CRNA: Anne Fu Performed: resident/CRNA  Preanesthetic Checklist Completed: patient identified, site marked, surgical consent, pre-op evaluation, timeout performed, IV checked, risks and benefits discussed and monitors and equipment checked Spinal Block Patient position: sitting Prep: ChloraPrep Patient monitoring: heart rate, continuous pulse ox and blood pressure Approach: right paramedian Location: L2-3 Injection technique: single-shot Needle Needle type: [place]  Needle gauge: 25 G Needle length: 9 cm Assessment Sensory level: T6 Additional Notes Expiration date of kit checked and confirmed. Patient tolerated procedure well, without complications. X 1 attempt with noted clear CSF return. Loss of motor and sensory on exam post injection.

## 2015-11-30 NOTE — Transfer of Care (Signed)
Immediate Anesthesia Transfer of Care Note  Patient: Vicki Turner  Procedure(s) Performed: Procedure(s) with comments: LEFT TOTAL KNEE ARTHROPLASTY (Left) - Adductor Block  Patient Location: PACU  Anesthesia Type:Spinal  Level of Consciousness: awake, alert  and oriented  Airway & Oxygen Therapy: Patient Spontanous Breathing and Patient connected to face mask oxygen  Post-op Assessment: Report given to RN and Post -op Vital signs reviewed and stable  Post vital signs: Reviewed and stable  Last Vitals:  Vitals:   11/30/15 1000  BP: (!) 148/68  Pulse: 60  Resp: 16  Temp: 36.4 C    Last Pain:  Vitals:   11/30/15 1000  TempSrc: Oral      Patients Stated Pain Goal: 3 (11/30/15 1157)  Complications: No apparent anesthesia complications

## 2015-12-01 DIAGNOSIS — E663 Overweight: Secondary | ICD-10-CM | POA: Diagnosis present

## 2015-12-01 LAB — BASIC METABOLIC PANEL
Anion gap: 5 (ref 5–15)
BUN: 17 mg/dL (ref 6–20)
CO2: 24 mmol/L (ref 22–32)
Calcium: 8.8 mg/dL — ABNORMAL LOW (ref 8.9–10.3)
Chloride: 107 mmol/L (ref 101–111)
Creatinine, Ser: 0.76 mg/dL (ref 0.44–1.00)
GFR calc Af Amer: >60 mL/min (ref >60)
GFR calc non Af Amer: >60 mL/min (ref >60)
Glucose, Bld: 127 mg/dL — ABNORMAL HIGH (ref 65–99)
Potassium: 4.8 mmol/L (ref 3.5–5.1)
Sodium: 136 mmol/L (ref 135–145)

## 2015-12-01 LAB — CBC
HEMATOCRIT: 33.4 % — AB (ref 36.0–46.0)
HEMOGLOBIN: 10.9 g/dL — AB (ref 12.0–15.0)
MCH: 31.1 pg (ref 26.0–34.0)
MCHC: 32.6 g/dL (ref 30.0–36.0)
MCV: 95.4 fL (ref 78.0–100.0)
Platelets: 292 10*3/uL (ref 150–400)
RBC: 3.5 MIL/uL — AB (ref 3.87–5.11)
RDW: 14.2 % (ref 11.5–15.5)
WBC: 15 10*3/uL — AB (ref 4.0–10.5)

## 2015-12-01 MED ORDER — POLYETHYLENE GLYCOL 3350 17 G PO PACK: 17.0000 g | PACK | Freq: Two times a day (BID) | ORAL | 0 refills | Status: DC

## 2015-12-01 MED ORDER — METHOCARBAMOL 500 MG PO TABS: 500.0000 mg | ORAL_TABLET | Freq: Four times a day (QID) | ORAL | 0 refills | Status: DC | PRN

## 2015-12-01 MED ORDER — DOCUSATE SODIUM 100 MG PO CAPS: 100.0000 mg | ORAL_CAPSULE | Freq: Two times a day (BID) | ORAL | 0 refills | Status: DC

## 2015-12-01 MED ORDER — HYDROCODONE-ACETAMINOPHEN 7.5-325 MG PO TABS: 1.0000 | ORAL_TABLET | ORAL | 0 refills | Status: DC | PRN

## 2015-12-01 MED ORDER — ASPIRIN 81 MG PO CHEW: 81.0000 mg | CHEWABLE_TABLET | Freq: Two times a day (BID) | ORAL | 0 refills | Status: AC

## 2015-12-01 MED ORDER — FERROUS SULFATE 325 (65 FE) MG PO TABS: 325.0000 mg | ORAL_TABLET | Freq: Three times a day (TID) | ORAL | 3 refills | Status: DC

## 2015-12-01 NOTE — Care Management Note (Signed)
Case Management Note  Patient Details  Name: Vicki Turner MRN: [id number] Date of Birth: July 31, 1950  Subjective/Objective:                  LEFT TOTAL KNEE ARTHROPLASTY (Left) Action/Plan: Discharge planning Expected Discharge Date:  12/01/15               Expected Discharge Plan:  [place]  In-House Referral:     Discharge planning Services  CM Consult  Post Acute Care Choice:  Home Health Choice offered to:  Patient  DME Arranged:  Walker rolling DME Agency:  [place]:  PT [place] Agency:  [facility] ([facility])  Status of Service:  Completed, signed off  If discussed at [employer] of Stay Meetings, dates discussed:    Additional Comments: CM met with pt in room to offer choice of home health agency.  Pt chooses [facility] to render HHPT. Referral called to [facility] rep, Tim.  CM notified [place] DME rep, Jermaine to please deliver the rolling walker to room prior to discharge.  No other CM needs were communicated. Dellie Catholic, RN 12/01/2015, 10:59 AM

## 2015-12-01 NOTE — Discharge Instructions (Signed)

## 2015-12-01 NOTE — Progress Notes (Signed)
Occupational Therapy Evaluation Patient Details Name: Vicki Turner MRN: [id number] DOB: [birth year] Today's Date: 12/01/2015    History of Present Illness Pt is a 65 year old female s/p L TKA   Clinical Impression   All OT education completed and pt questions answered. No further OT needs; will sign off.    Follow Up Recommendations  No OT follow up;Supervision - Intermittent    Equipment Recommendations  None recommended by OT    Recommendations for Other Services       Precautions / Restrictions Precautions Precautions: Knee Restrictions Weight Bearing Restrictions: No Other Position/Activity Restrictions: WBAT      Mobility Bed Mobility            General bed mobility comments: NT -- OOB in bathroom upon OT arrival  Transfers Overall transfer level: Needs assistance Equipment used: Rolling walker (2 wheeled) Transfers: Sit to/from Stand Sit to Stand: Min guard         General transfer comment: verbal cues for UE and LE positioning    Balance                                            ADL Overall ADL's : Needs assistance/impaired Eating/Feeding: Independent;Sitting   Grooming: Wash/dry hands;Supervision/safety;Standing           Upper Body Dressing : Set up;Sitting   Lower Body Dressing: Minimal assistance;Sit to/from stand   Toilet Transfer: Min guard;Ambulation;BSC;RW   Toileting- Clothing Manipulation and Hygiene: Min guard   Tub/ Shower Transfer: Walk-in shower;Min guard;Ambulation;Rolling walker;Grab bars   Functional mobility during ADLs: Min guard;Rolling walker General ADL Comments: Educated on LB self-care techniques. Patient's husband can assist with ADLs as needed. Educated pt to also have husband present for showers the first few times. Pt verbalized understanding of all.     Vision     Perception     Praxis      Pertinent Vitals/Pain Pain Assessment: 0-10 Pain Score: 5  Pain Location: L  knee Pain Descriptors / Indicators: Aching;Sore Pain Intervention(s): Limited activity within patient's tolerance;Monitored during session;Repositioned;Ice applied     Hand Dominance     Extremity/Trunk Assessment Upper Extremity Assessment Upper Extremity Assessment: Overall WFL for tasks assessed   Lower Extremity Assessment Lower Extremity Assessment: Defer to PT evaluation        Communication Communication Communication: No difficulties   Cognition Arousal/Alertness: Awake/alert Behavior During Therapy: WFL for tasks assessed/performed Overall Cognitive Status: Within Functional Limits for tasks assessed                     General Comments       Exercises       Shoulder Instructions      Home Living Family/patient expects to be discharged to:: Private residence Living Arrangements: Spouse/significant other   Type of Home: House Home Access: Stairs to enter Secretary/administratorntrance Stairs-Number of Steps: 3 Entrance Stairs-Rails: Right Home Layout: One level     Bathroom Shower/Tub: Producer, television/film/videoWalk-in shower   Bathroom Toilet: Handicapped height Bathroom Accessibility: Yes How Accessible: Accessible via walker Home Equipment: Walker - 4 wheels;Cane - single point;Grab bars - tub/shower;Grab bars - toilet          Prior Functioning/Environment Level of Independence: Independent                 OT Problem List: Decreased strength;Decreased range  of motion;Pain;Decreased knowledge of use of DME or AE   OT Treatment/Interventions:      OT Goals(Current goals can be found in the care plan section) Acute Rehab OT Goals Patient Stated Goal: home today OT Goal Formulation: All assessment and education complete, DC therapy  OT Frequency:     Barriers to D/C:            Co-evaluation              End of Session Equipment Utilized During Treatment: Rolling walker Nurse Communication: Mobility status  Activity Tolerance: Patient tolerated treatment  well Patient left: in chair;with call bell/phone within reach;with chair alarm set   Time: 1124-1136 OT Time Calculation (min): 12 min Charges:  OT General Charges $OT Visit: 1 Procedure OT Evaluation $OT Eval Low Complexity: 1 Procedure G-Codes:    Karyl Sharrar A 2015/12/23, 12:28 PM

## 2015-12-01 NOTE — Evaluation (Signed)
Physical Therapy Evaluation Patient Details Name: Vicki Turner MRN: [id number] DOB: 10-30-1950 Today's Date: 12/01/2015   History of Present Illness  Pt is a 65 year old female s/p L TKA  Clinical Impression  Pt is s/p L TKA resulting in the deficits listed below (see PT Problem List).  Pt will benefit from skilled PT to increase their independence and safety with mobility to allow discharge to the venue listed below. Pt mobilizing slowly this morning however will likely d/c home this afternoon after practicing steps.  Pt plans to have HHPT upon d/c and spouse assist as needed.     Follow Up Recommendations Home health PT    Equipment Recommendations  Rolling walker with 5" wheels    Recommendations for Other Services       Precautions / Restrictions Precautions Precautions: (P) Knee Restrictions Weight Bearing Restrictions: (P) No Other Position/Activity Restrictions: (P) WBAT      Mobility  Bed Mobility Overal bed mobility: Needs Assistance Bed Mobility: Supine to Sit     Supine to sit: Supervision     General bed mobility comments: verbal cues for technique  Transfers Overall transfer level: Needs assistance Equipment used: Rolling walker (2 wheeled) Transfers: Sit to/from Stand Sit to Stand: Min guard         General transfer comment: verbal cues for UE and LE positioning  Ambulation/Gait Ambulation/Gait assistance: Min guard Ambulation Distance (Feet): 100 Feet Assistive device: Rolling walker (2 wheeled) Gait Pattern/deviations: Step-to pattern;Decreased stance time - left;Antalgic Gait velocity: decr   General Gait Details: verbal cues for sequence, RW positioning, posture, step length, pt ambulated to/from bathroom and into hallway, slow but steady pace  Stairs            Wheelchair Mobility    Modified Rankin (Stroke Patients Only)       Balance                                             Pertinent  Vitals/Pain Pain Assessment: (P) 0-10 Pain Score: (P) 5  Pain Location: (P) L knee Pain Descriptors / Indicators: (P) Aching;Sore Pain Intervention(s): (P) Limited activity within patient's tolerance;Monitored during session;Repositioned;Ice applied    Home Living Family/patient expects to be discharged to:: (P) Private residence Living Arrangements: (P) Spouse/significant other   Type of Home: (P) House Home Access: (P) Stairs to enter Entrance Stairs-Rails: (P) Right Entrance Stairs-Number of Steps: (P) 3 Home Layout: (P) One level Home Equipment: (P) Walker - 4 wheels;Cane - single point;Grab bars - tub/shower;Grab bars - toilet      Prior Function Level of Independence: (P) Independent               Hand Dominance        Extremity/Trunk Assessment   Upper Extremity Assessment: (P) Overall WFL for tasks assessed           Lower Extremity Assessment: (P) Defer to PT evaluation   LLE Deficits / Details: able to perform SLR, AAROM knee flexion 90* supine     Communication   Communication: (P) No difficulties  Cognition Arousal/Alertness: (P) Awake/alert Behavior During Therapy: (P) WFL for tasks assessed/performed Overall Cognitive Status: (P) Within Functional Limits for tasks assessed                      General Comments  Exercises Total Joint Exercises Ankle Circles/Pumps: AROM;Both;10 reps Quad Sets: AROM;Both;10 reps Heel Slides: AAROM;Left;10 reps;Seated Straight Leg Raises: AROM;Left;10 reps   Assessment/Plan    PT Assessment Patient needs continued PT services  PT Problem List Decreased strength;Decreased range of motion;Decreased knowledge of use of DME;Decreased mobility;Pain          PT Treatment Interventions DME instruction;Gait training;Functional mobility training;Therapeutic exercise;Therapeutic activities;Stair training;Patient/family education    PT Goals (Current goals can be found in the Care Plan section)   Acute Rehab PT Goals PT Goal Formulation: With patient Time For Goal Achievement: 12/04/15 Potential to Achieve Goals: Good    Frequency Min 3X/week   Barriers to discharge        Co-evaluation               End of Session Equipment Utilized During Treatment: Gait belt Activity Tolerance: Patient tolerated treatment well Patient left: in chair;with call bell/phone within reach           Time: 0918-0959 PT Time Calculation (min) (ACUTE ONLY): 41 min   Charges:   PT Evaluation $PT Eval Low Complexity: 1 Procedure PT Treatments $Gait Training: 8-22 mins   PT G Codes:        Suhaas Agena,KATHrine E 12/01/2015, 12:26 PM Zenovia JarredKati Dewan Emond, PT, DPT 12/01/2015 Pager: [phone]

## 2015-12-01 NOTE — Progress Notes (Signed)
   12/01/15 1500  PT Visit Information  Last PT Received On 12/01/15  Assistance Needed +1  History of Present Illness Pt is a 65 year old female s/p L TKA  Subjective Data  Subjective Pt ambulated again in hallway and practiced safe stair technique with spouse present.  Pt to possibly d/c home today.  Precautions  Precautions Knee  Restrictions  Other Position/Activity Restrictions WBAT  Pain Assessment  Pain Assessment 0-10  Pain Score 4  Pain Location L knee  Pain Descriptors / Indicators Aching;Sore  Pain Intervention(s) Limited activity within patient's tolerance;Monitored during session;Repositioned;Ice applied  Cognition  Arousal/Alertness Awake/alert  Behavior During Therapy WFL for tasks assessed/performed  Overall Cognitive Status Within Functional Limits for tasks assessed  Bed Mobility  Overal bed mobility Needs Assistance  Bed Mobility Supine to Sit;Sit to Supine  Supine to sit Supervision  Sit to supine Supervision  Transfers  Overall transfer level Needs assistance  Equipment used Rolling walker (2 wheeled)  Transfers Sit to/from Stand  Sit to Stand Min guard  General transfer comment verbal cues for UE and LE positioning  Ambulation/Gait  Ambulation/Gait assistance Min guard  Ambulation Distance (Feet) 120 Feet  Assistive device Rolling walker (2 wheeled)  Gait Pattern/deviations Step-to pattern;Antalgic;Decreased stance time - left  General Gait Details verbal cues for sequence, RW positioning, posture, step length  Stairs Yes  Stairs assistance Min guard  Stair Management Forwards;Sideways;Step to pattern;One rail Right  Number of Stairs 3  General stair comments verbal cues for safe technique using bil UEs on R rail, sequence, safety, spouse observed  PT - End of Session  Activity Tolerance Patient tolerated treatment well  Patient left in bed;with call bell/phone within reach;with family/visitor present  PT - Assessment/Plan  PT Plan Current plan  remains appropriate  PT Frequency (ACUTE ONLY) 7X/week  Follow Up Recommendations Home health PT  PT equipment Rolling walker with 5" wheels  PT Goal Progression  Progress towards PT goals Progressing toward goals  PT Time Calculation  PT Start Time (ACUTE ONLY) 1457  PT Stop Time (ACUTE ONLY) 1515  PT Time Calculation (min) (ACUTE ONLY) 18 min  PT General Charges  $$ ACUTE PT VISIT 1 Procedure  PT Treatments  $Gait Training 8-22 mins   Zenovia JarredKati Tuck [place], PT, DPT 12/01/2015 Pager: [phone]

## 2015-12-01 NOTE — Progress Notes (Signed)
     Subjective: 1 Day Post-Op Procedure(s) (LRB): LEFT TOTAL KNEE ARTHROPLASTY (Left)   Patient reports pain as mild, pain controlled.  No events throughout the night.  Ready to be discharged home if she does well with PT and pain stays controlled.   Objective:   VITALS:   Vitals:   12/01/15 0157 12/01/15 0559  BP: (!) 98/49 (!) 127/46  Pulse: (!) 50 (!) 48  Resp: 16 16  Temp: 97.6 F (36.4 C) 97.5 F (36.4 C)    Dorsiflexion/Plantar flexion intact Incision: dressing C/D/I No cellulitis present Compartment soft  LABS  Recent Labs  12/01/15 0429  HGB 10.9*  HCT 33.4*  WBC 15.0*  PLT 292     Recent Labs  12/01/15 0429  NA 136  K 4.8  BUN 17  CREATININE 0.76  GLUCOSE 127*     Assessment/Plan: 1 Day Post-Op Procedure(s) (LRB): LEFT TOTAL KNEE ARTHROPLASTY (Left) Foley cath d/c'ed Advance diet Up with therapy D/C IV fluids Discharge home with home health  [facility] Follow up with OLIN,Tenesha Garza D in 2 weeks.  Contact information:  [facility] [address], [address] [place] [id number] [phone]    Overweight (BMI 25-29.9) Estimated body mass index is 25.65 kg/m as calculated from the following:   Height as of this encounter: 5' 5.5" (1.664 m).   Weight as of this encounter: 71 kg (156 lb 8 oz). Patient also counseled that weight may inhibit the healing process Patient counseled that losing weight will help with future health issues        Anastasio AuerbachMatthew S. Dannah Ryles   PAC  12/01/2015, 9:00 AM

## 2015-12-03 NOTE — Discharge Summary (Signed)
Physician Discharge Summary  Patient ID: Vicki Turner MRN: [id number] DOB/AGE: 04/21/1950 65 y.o.  Admit date: 11/30/2015 Discharge date: 12/01/2015   Procedures:  Procedure(s) (LRB): LEFT TOTAL KNEE ARTHROPLASTY (Left)  Attending Physician:  Dr. Durene Romans   Admission Diagnoses:   Left knee primary OA / pain  Discharge Diagnoses:  Principal Problem:   S/P left TKA Active Problems:   Overweight (BMI 25.0-29.9)  Past Medical History:  Diagnosis Date  . Family history of adverse reaction to anesthesia    pt daughter very nauseous with anesthesia.  . H/O seasonal allergies    uses OTC med  . History of anemia    no problems now since Hysterectomy  . History of endometriosis   . Hypertension   . Migraine    Nadolol was use to help-no longer a problem.  . Osteopenia   . Recurrent sinus infections    last Spring 2017   . Vaginal prolapse    "wears pessary"    HPI:    Vicki Turner, 65 y.o. female, has a history of pain and functional disability in the left knee due to arthritis and has failed non-surgical conservative treatments for greater than 12 weeks to includeNSAID's and/or analgesics, corticosteriod injections, viscosupplementation injections and activity modification.  Onset of symptoms was gradual, starting 3+ years ago with gradually worsening course since that time. The patient noted no past surgery on the left knee(s).  Patient currently rates pain in the left knee(s) at 9 out of 10 with activity. Patient has night pain, worsening of pain with activity and weight bearing, pain that interferes with activities of daily living, pain with passive range of motion, crepitus and joint swelling.  Patient has evidence of periarticular osteophytes and joint space narrowing by imaging studies.  There is no active infection.  Risks, benefits and expectations were discussed with the patient.  Risks including but not limited to the risk of anesthesia, blood clots, nerve damage,  blood vessel damage, failure of the prosthesis, infection and up to and including death.  Patient understand the risks, benefits and expectations and wishes to proceed with surgery.   PCP: Mickie Hillier, MD   Discharged Condition: good  Hospital Course:  Patient underwent the above stated procedure on 11/30/2015. Patient tolerated the procedure well and brought to the recovery room in good condition and subsequently to the floor.  POD #1 BP: 127/46 ; Pulse: 48 ; Temp: 97.5 F (36.4 C) ; Resp: 16 Patient reports pain as mild, pain controlled.  No events throughout the night.  Ready to be discharged home. Dorsiflexion/plantar flexion intact, incision: dressing C/D/I, no cellulitis present and compartment soft.   LABS  Basename    HGB     10.9  HCT     33.4    Discharge Exam: General appearance: alert, cooperative and no distress Extremities: Homans sign is negative, no sign of DVT, no edema, redness or tenderness in the calves or thighs and no ulcers, gangrene or trophic changes  Disposition: Home with follow up in 2 weeks   Follow-up Information    Shelda Pal, MD. Schedule an appointment as soon as possible for a visit in 2 week(s).   Specialty:  Orthopedic Surgery Contact information: [address] [place] [address] [phone]        [facility] .   Specialty:  Home Health Services Why:  home health physical therapy Contact information: [address] [place] [address] [place] [address] [phone]  Inc[facility] - [facility] .   Why:  rolling walker Contact information: [address] [place] [id number] [phone]           Discharge Instructions    Call MD / Call 911    Complete by:  As directed    If you experience chest pain or shortness of breath, CALL 911 and be transported to the hospital emergency room.  If you develope a fever above 101 F, pus (white drainage) or increased drainage or redness at  the wound, or calf pain, call your surgeon's office.   Change dressing    Complete by:  As directed    Maintain surgical dressing until follow up in the clinic. If the edges start to pull up, may reinforce with tape. If the dressing is no longer working, may remove and cover with gauze and tape, but must keep the area dry and clean.  Call with any questions or concerns.   Constipation Prevention    Complete by:  As directed    Drink plenty of fluids.  Prune juice may be helpful.  You may use a stool softener, such as Colace (over the counter) 100 mg twice a day.  Use MiraLax (over the counter) for constipation as needed.   Diet - low sodium heart healthy    Complete by:  As directed    Discharge instructions    Complete by:  As directed    Maintain surgical dressing until follow up in the clinic. If the edges start to pull up, may reinforce with tape. If the dressing is no longer working, may remove and cover with gauze and tape, but must keep the area dry and clean.  [facility]. Call with any questions or concerns.   Increase activity slowly as tolerated    Complete by:  As directed    Weight bearing as tolerated with assist device (walker, cane, etc) as directed, use it as long as suggested by your surgeon or therapist, typically at least 4-6 weeks.   TED hose    Complete by:  As directed    Use stockings (TED hose) for 2 weeks on both leg(s).  You may remove them at night for sleeping.        Medication List    STOP taking these medications   conjugated estrogens vaginal cream Commonly known as:  PREMARIN   diclofenac 75 MG EC tablet Commonly known as:  VOLTAREN     TAKE these medications   aspirin 81 MG chewable tablet Chew 1 tablet (81 mg total) by mouth 2 (two) times daily.   Calcipotriene 0.005 % solution Apply 1 application topically 2 (two) times daily as needed (for ezcema.).   clobetasol 0.05 % external solution Commonly known  as:  TEMOVATE Apply 1 application topically 2 (two) times daily as needed (APPLY TO SCALP 1 TO 2 TIMES A DAY FOR FLARES).   docusate sodium 100 MG capsule Commonly known as:  COLACE Take 1 capsule (100 mg total) by mouth 2 (two) times daily.   ferrous sulfate 325 (65 FE) MG tablet Take 1 tablet (325 mg total) by mouth 3 (three) times daily after meals.   fluticasone 50 MCG/ACT nasal spray Commonly known as:  FLONASE Place 1-2 sprays into both nostrils daily as needed (for nasal congestion.).   gabapentin 100 MG capsule Commonly known as:  NEURONTIN TAKE 1 CAPSULE (100 MG TOTAL) BY MOUTH AT BEDTIME.  HYDROcodone-acetaminophen 7.5-325 MG tablet Commonly known as:  NORCO Take 1-2 tablets by mouth every 4 (four) hours as needed for moderate pain.   loratadine 10 MG tablet Commonly known as:  CLARITIN Take 10 mg by mouth daily.   methocarbamol 500 MG tablet Commonly known as:  ROBAXIN Take 1 tablet (500 mg total) by mouth every 6 (six) hours as needed for muscle spasms.   nadolol 20 MG tablet Commonly known as:  CORGARD TAKE 1 TABLET (20 MG TOTAL) BY MOUTH DAILY. What changed:  See the new instructions.   PARoxetine 10 MG tablet Commonly known as:  PAXIL TAKE 1.5 TABLETS (15 MG TOTAL) BY MOUTH DAILY.   polyethylene glycol packet Commonly known as:  MIRALAX / GLYCOLAX Take 17 g by mouth 2 (two) times daily.   SUMAtriptan 100 MG tablet Commonly known as:  IMITREX Take 1 tablet (100 mg total) by mouth every 2 (two) hours as needed for migraine.        Signed: Anastasio Auerbach. Waynetta Metheny   PA-C  12/03/2015, 8:16 AM

## 2015-12-09 ENCOUNTER — Emergency Department (HOSPITAL_COMMUNITY): Payer: Medicare Other

## 2015-12-09 ENCOUNTER — Inpatient Hospital Stay (HOSPITAL_COMMUNITY)
Admission: EM | Admit: 2015-12-09 | Discharge: 2015-12-12 | DRG: 343 | Disposition: A | Discharge status: Home Health | Payer: Medicare Other | Attending: Surgery | Admitting: Surgery

## 2015-12-09 ENCOUNTER — Encounter (HOSPITAL_COMMUNITY): Payer: Self-pay

## 2015-12-09 DIAGNOSIS — K3580 Unspecified acute appendicitis: Principal | ICD-10-CM | POA: Diagnosis present

## 2015-12-09 DIAGNOSIS — Z8249 Family history of ischemic heart disease and other diseases of the circulatory system: Secondary | ICD-10-CM

## 2015-12-09 DIAGNOSIS — G43909 Migraine, unspecified, not intractable, without status migrainosus: Secondary | ICD-10-CM | POA: Diagnosis present

## 2015-12-09 DIAGNOSIS — Z96652 Presence of left artificial knee joint: Secondary | ICD-10-CM | POA: Diagnosis present

## 2015-12-09 DIAGNOSIS — K3589 Other acute appendicitis without perforation or gangrene: Secondary | ICD-10-CM

## 2015-12-09 DIAGNOSIS — M8580 Other specified disorders of bone density and structure, unspecified site: Secondary | ICD-10-CM | POA: Diagnosis present

## 2015-12-09 DIAGNOSIS — K37 Unspecified appendicitis: Secondary | ICD-10-CM | POA: Diagnosis present

## 2015-12-09 DIAGNOSIS — Z886 Allergy status to analgesic agent status: Secondary | ICD-10-CM

## 2015-12-09 DIAGNOSIS — Z9049 Acquired absence of other specified parts of digestive tract: Secondary | ICD-10-CM

## 2015-12-09 DIAGNOSIS — I10 Essential (primary) hypertension: Secondary | ICD-10-CM | POA: Diagnosis present

## 2015-12-09 LAB — COMPREHENSIVE METABOLIC PANEL
ALT: 86 U/L — ABNORMAL HIGH (ref 14–54)
ANION GAP: 9 (ref 5–15)
AST: 74 U/L — AB (ref 15–41)
Albumin: 3.6 g/dL (ref 3.5–5.0)
Alkaline Phosphatase: 111 U/L (ref 38–126)
BILIRUBIN TOTAL: 0.8 mg/dL (ref 0.3–1.2)
BUN: 24 mg/dL — AB (ref 6–20)
CHLORIDE: 102 mmol/L (ref 101–111)
CO2: 22 mmol/L (ref 22–32)
Calcium: 9.1 mg/dL (ref 8.9–10.3)
Creatinine, Ser: 0.68 mg/dL (ref 0.44–1.00)
Glucose, Bld: 116 mg/dL — ABNORMAL HIGH (ref 65–99)
POTASSIUM: 3.9 mmol/L (ref 3.5–5.1)
Sodium: 133 mmol/L — ABNORMAL LOW (ref 135–145)
TOTAL PROTEIN: 7.7 g/dL (ref 6.5–8.1)

## 2015-12-09 LAB — CBC WITH DIFFERENTIAL/PLATELET
BASOS ABS: 0 10*3/uL (ref 0.0–0.1)
Basophils Relative: 0 %
EOS ABS: 0 10*3/uL (ref 0.0–0.7)
Eosinophils Relative: 0 %
HCT: 31.6 % — ABNORMAL LOW (ref 36.0–46.0)
HEMOGLOBIN: 10.5 g/dL — AB (ref 12.0–15.0)
LYMPHS PCT: 6 %
Lymphs Abs: 1.6 10*3/uL (ref 0.7–4.0)
MCH: 30.6 pg (ref 26.0–34.0)
MCHC: 33.2 g/dL (ref 30.0–36.0)
MCV: 92.1 fL (ref 78.0–100.0)
MONOS PCT: 9 %
Monocytes Absolute: 2.4 10*3/uL — ABNORMAL HIGH (ref 0.1–1.0)
NEUTROS ABS: 22.5 10*3/uL — AB (ref 1.7–7.7)
Neutrophils Relative %: 85 %
Platelets: 466 10*3/uL — ABNORMAL HIGH (ref 150–400)
RBC: 3.43 MIL/uL — AB (ref 3.87–5.11)
RDW: 14 % (ref 11.5–15.5)
WBC: 26.5 10*3/uL — AB (ref 4.0–10.5)

## 2015-12-09 LAB — URINALYSIS, ROUTINE W REFLEX MICROSCOPIC
BILIRUBIN URINE: NEGATIVE
Glucose, UA: NEGATIVE mg/dL
Hgb urine dipstick: NEGATIVE
KETONES UR: 15 mg/dL — AB
LEUKOCYTES UA: NEGATIVE
NITRITE: NEGATIVE
Protein, ur: NEGATIVE mg/dL
SPECIFIC GRAVITY, URINE: 1.035 — AB (ref 1.005–1.030)
pH: 6 (ref 5.0–8.0)

## 2015-12-09 LAB — I-STAT CG4 LACTIC ACID, ED
LACTIC ACID, VENOUS: 0.75 mmol/L (ref 0.5–1.9)
LACTIC ACID, VENOUS: 0.64 mmol/L (ref 0.5–1.9)

## 2015-12-09 LAB — LIPASE, BLOOD: LIPASE: 17 U/L (ref 11–51)

## 2015-12-09 MED ORDER — ACETAMINOPHEN 325 MG PO TABS
650.0000 mg | ORAL_TABLET | Freq: Once | ORAL | Status: AC | PRN
  Administered 2015-12-09: 650 mg via ORAL
  Filled 2015-12-09: qty 2

## 2015-12-09 MED ORDER — NADOLOL 20 MG PO TABS
20.0000 mg | ORAL_TABLET | Freq: Every day | ORAL | Status: DC
  Administered 2015-12-09: 20 mg via ORAL
  Filled 2015-12-09: qty 1

## 2015-12-09 MED ORDER — MORPHINE SULFATE (PF) 4 MG/ML IV SOLN
4.0000 mg | Freq: Once | INTRAVENOUS | Status: AC
  Administered 2015-12-09: 4 mg via INTRAVENOUS
  Filled 2015-12-09: qty 1

## 2015-12-09 MED ORDER — ENOXAPARIN SODIUM 40 MG/0.4ML ~~LOC~~ SOLN
40.0000 mg | SUBCUTANEOUS | Status: DC
Start: 2015-12-09 — End: 2015-12-10
  Administered 2015-12-09: 40 mg via SUBCUTANEOUS
  Filled 2015-12-09: qty 0.4

## 2015-12-09 MED ORDER — ONDANSETRON 4 MG PO TBDP: 4.0000 mg | ORAL_TABLET | Freq: Four times a day (QID) | ORAL | Status: DC | PRN

## 2015-12-09 MED ORDER — DIPHENHYDRAMINE HCL 12.5 MG/5ML PO ELIX: 12.5000 mg | ORAL_SOLUTION | Freq: Four times a day (QID) | ORAL | Status: DC | PRN

## 2015-12-09 MED ORDER — HYDROMORPHONE HCL 1 MG/ML IJ SOLN
0.5000 mg | INTRAMUSCULAR | Status: DC | PRN
  Filled 2015-12-09: qty 1

## 2015-12-09 MED ORDER — IOPAMIDOL (ISOVUE-300) INJECTION 61%
100.0000 mL | Freq: Once | INTRAVENOUS | Status: AC | PRN
  Administered 2015-12-09: 100 mL via INTRAVENOUS

## 2015-12-09 MED ORDER — DEXTROSE 5 % IV SOLN
2.0000 g | Freq: Every day | INTRAVENOUS | Status: DC
  Administered 2015-12-09: 2 g via INTRAVENOUS
  Filled 2015-12-09: qty 2

## 2015-12-09 MED ORDER — ONDANSETRON 4 MG PO TBDP
4.0000 mg | ORAL_TABLET | Freq: Once | ORAL | Status: AC | PRN
  Administered 2015-12-09: 4 mg via ORAL
  Filled 2015-12-09: qty 1

## 2015-12-09 MED ORDER — DIPHENHYDRAMINE HCL 50 MG/ML IJ SOLN: 12.5000 mg | Freq: Four times a day (QID) | INTRAMUSCULAR | Status: DC | PRN

## 2015-12-09 MED ORDER — ONDANSETRON HCL 4 MG/2ML IJ SOLN: 4.0000 mg | Freq: Four times a day (QID) | INTRAMUSCULAR | Status: DC | PRN

## 2015-12-09 MED ORDER — METRONIDAZOLE IN NACL 5-0.79 MG/ML-% IV SOLN
500.0000 mg | Freq: Three times a day (TID) | INTRAVENOUS | Status: DC
  Administered 2015-12-10 (×2): 500 mg via INTRAVENOUS
  Filled 2015-12-09: qty 100

## 2015-12-09 MED ORDER — SODIUM CHLORIDE 0.9 % IV SOLN
INTRAVENOUS | Status: AC
  Administered 2015-12-09: via INTRAVENOUS

## 2015-12-09 MED ORDER — HYDROMORPHONE HCL 1 MG/ML IJ SOLN
0.5000 mg | INTRAMUSCULAR | Status: DC | PRN
  Administered 2015-12-09: 1 mg via INTRAVENOUS

## 2015-12-09 NOTE — ED Notes (Signed)
This RN attempted to call report. Unit RN will call the ED when she is ready to take report

## 2015-12-09 NOTE — ED Notes (Signed)
Report given to [place] on 5E.

## 2015-12-09 NOTE — ED Notes (Signed)
Pt to xray

## 2015-12-09 NOTE — ED Provider Notes (Signed)
[facility]-EMERGENCY DEPT Provider Note   CSN: [id number] Arrival date & time: 12/09/15  1651     History   Chief Complaint Chief Complaint  Patient presents with  . Abdominal Pain    HPI Vicki Turner is a 65 y.o. female.  HPI   65 year old female recently had a left total knee replacement done by Dr. Charlann Boxer 10/10 presenting today with complaints of abdominal pain. Patient states she was discharged home on 10/11. She has been following up with adequate physical therapy, she takes pain medication as needed, and she has been staying active as best as she could. Initially she was having some constipation but that's shortly improves with stool softener, laxative and decrease use of narcotic. Yesterday she developed pain to her right lower quadrant which she described as a gas-like pressure sensation, waxing and waning, moderate in intensity, worsening with movement. She tries having bowel movement was able to produce BMs that is nonbloody and non-mucousy but her pain has not improved. Pain continues throughout the day today. She endorse nausea without vomiting. She also report having subjective fever, chills, and generalized body ache. She denies any severe headache, productive cough, chest pain, short of breath, back pain, dysuria, hematuria, vaginal bleeding or vaginal discharge. She has prior partial hysterectomy but no other abdominal surgery. She still has intact appendix. She report that her left knee from the surgery has been doing well and she denies any significant pain to the surgical site. She denies feeling constipated. She is able to pass flatus.  Past Medical History:  Diagnosis Date  . Family history of adverse reaction to anesthesia    pt daughter very nauseous with anesthesia.  . H/O seasonal allergies    uses OTC med  . History of anemia    no problems now since Hysterectomy  . History of endometriosis   . Hypertension   . Migraine    Nadolol was use to help-no longer a  problem.  . Osteopenia   . Recurrent sinus infections    last Spring 2017   . Vaginal prolapse    "wears pessary"    Patient Active Problem List   Diagnosis Date Noted  . Overweight (BMI 25.0-29.9) 12/01/2015  . S/P left TKA 11/30/2015  . Migraine, unspecified, without mention of intractable migraine without mention of status migrainosus 09/02/2013  . History of endometriosis 09/02/2013    Past Surgical History:  Procedure Laterality Date  . NASAL SEPTUM SURGERY    . NASAL SINUS SURGERY  2002  . TOTAL ABDOMINAL HYSTERECTOMY  1998   DUB/endometriosis  . TOTAL KNEE ARTHROPLASTY Left 11/30/2015   Procedure: LEFT TOTAL KNEE ARTHROPLASTY;  Surgeon: Durene Romans, MD;  Location: [facility] ORS;  Service: Orthopedics;  Laterality: Left;  Adductor Block    OB History    Gravida Para Term Preterm AB Living   2 2 2  0 0 2   SAB TAB Ectopic Multiple Live Births   0 0 0 0 2       Home Medications    Prior to Admission medications   Medication Sig Start Date End Date Taking? Authorizing Provider  aspirin 81 MG chewable tablet Chew 1 tablet (81 mg total) by mouth 2 (two) times daily. 12/01/15 12/31/15  Lanney Gins, PA-C  Calcipotriene 0.005 % solution Apply 1 application topically 2 (two) times daily as needed (for ezcema.).  05/14/12   Historical Provider, MD  clobetasol (TEMOVATE) 0.05 % external solution Apply 1 application topically 2 (two) times daily as needed (  APPLY TO SCALP 1 TO 2 TIMES A DAY FOR FLARES).    Historical Provider, MD  docusate sodium (COLACE) 100 MG capsule Take 1 capsule (100 mg total) by mouth 2 (two) times daily. 12/01/15   Lanney GinsMatthew Babish, PA-C  ferrous sulfate 325 (65 FE) MG tablet Take 1 tablet (325 mg total) by mouth 3 (three) times daily after meals. 12/01/15   Lanney GinsMatthew Babish, PA-C  fluticasone (FLONASE) 50 MCG/ACT nasal spray Place 1-2 sprays into both nostrils daily as needed (for nasal congestion.).  05/14/12   Historical Provider, MD  gabapentin (NEURONTIN) 100  MG capsule TAKE 1 CAPSULE (100 MG TOTAL) BY MOUTH AT BEDTIME. 10/13/15   Ria CommentPatricia Grubb, FNP  HYDROcodone-acetaminophen (NORCO) 7.5-325 MG tablet Take 1-2 tablets by mouth every 4 (four) hours as needed for moderate pain. 12/01/15   Lanney GinsMatthew Babish, PA-C  loratadine (CLARITIN) 10 MG tablet Take 10 mg by mouth daily.    Historical Provider, MD  methocarbamol (ROBAXIN) 500 MG tablet Take 1 tablet (500 mg total) by mouth every 6 (six) hours as needed for muscle spasms. 12/01/15   Lanney GinsMatthew Babish, PA-C  nadolol (CORGARD) 20 MG tablet TAKE 1 TABLET (20 MG TOTAL) BY MOUTH DAILY. Patient taking differently: TAKE 1 TABLET (20 MG TOTAL) BY MOUTH DAILY IN THE EVENING 11/03/15   Ria CommentPatricia Grubb, FNP  PARoxetine (PAXIL) 10 MG tablet TAKE 1.5 TABLETS (15 MG TOTAL) BY MOUTH DAILY. Patient not taking: Reported on 11/19/2015 10/04/15   Ria CommentPatricia Grubb, FNP  polyethylene glycol [facility](MIRALAX / Ethelene HalGLYCOLAX) packet Take 17 g by mouth 2 (two) times daily. 12/01/15   Lanney GinsMatthew Babish, PA-C  SUMAtriptan (IMITREX) 100 MG tablet Take 1 tablet (100 mg total) by mouth every 2 (two) hours as needed for migraine. 09/02/13   Jerene BearsMary S Miller, MD    Family History Family History  Problem Relation Age of Onset  . Hypertension Maternal Grandfather   . Heart attack Father   . Miscarriages / Stillbirths Sister   . Transient ischemic attack Maternal Grandmother   . Osteoarthritis Mother     Social History Social History  Substance Use Topics  . Smoking status: Never Smoker  . Smokeless tobacco: Never Used  . Alcohol use 3.0 oz/week    5 Standard drinks or equivalent per week     Comment: wine     Allergies   Novocain [procaine]   Review of Systems Review of Systems  All other systems reviewed and are negative.    Physical Exam Updated Vital Signs BP 118/65 (BP Location: Left Arm)   Pulse 90   Temp 100.7 F (38.2 C) (Oral)   Resp 18   Wt 68 kg   LMP 02/20/1986 (Approximate)   SpO2 97%   BMI 24.58 kg/m   Physical Exam    Constitutional: She appears well-developed and well-nourished. No distress.  Patient is well-appearing, nontoxic, laying in bed in no acute discomfort.  HENT:  Head: Atraumatic.  Eyes: Conjunctivae are normal.  Neck: Neck supple.  Cardiovascular: Normal rate and regular rhythm.   Pulmonary/Chest: Effort normal and breath sounds normal. No respiratory distress. She has no wheezes. She has no rales. She exhibits no tenderness.  Abdominal: Soft. She exhibits no distension. There is tenderness (Tenderness to right lower quadrant with guarding but without rebound tenderness.).  Musculoskeletal:  Left knee: Surgical dressing is in place, normal knee flexion and extension with minimal tenderness to palpation.  Neurological: She is alert.  Skin: No rash noted.  Psychiatric: She has a normal mood  and affect.  Nursing note and vitals reviewed.    ED Treatments / Results  Labs (all labs ordered are listed, but only abnormal results are displayed) Labs Reviewed  COMPREHENSIVE METABOLIC PANEL - Abnormal; Notable for the following:       Result Value   Sodium 133 (*)    Glucose, Bld 116 (*)    BUN 24 (*)    AST 74 (*)    ALT 86 (*)    All other components within normal limits  CBC WITH DIFFERENTIAL/PLATELET - Abnormal; Notable for the following:    WBC 26.5 (*)    RBC 3.43 (*)    Hemoglobin 10.5 (*)    HCT 31.6 (*)    Platelets 466 (*)    Neutro Abs 22.5 (*)    Monocytes Absolute 2.4 (*)    All other components within normal limits  CULTURE, BLOOD (ROUTINE X 2)  CULTURE, BLOOD (ROUTINE X 2)  URINE CULTURE  LIPASE, BLOOD  URINALYSIS, ROUTINE W REFLEX MICROSCOPIC (NOT AT [facility])  I-STAT CG4 LACTIC ACID, ED  I-STAT CG4 LACTIC ACID, ED    EKG  EKG Interpretation None       Radiology Dg Chest 2 View  Result Date: 12/09/2015 CLINICAL DATA:  Acute onset of chills and body aches. Right lower quadrant abdominal pain and nausea. EXAM: CHEST  2 VIEW COMPARISON:  CT of the chest  performed 03/18/2009 FINDINGS: The lungs are well-aerated. Mild peribronchial thickening is noted. There is no evidence of focal opacification, pleural effusion or pneumothorax. The heart is borderline normal in size. No acute osseous abnormalities are seen. IMPRESSION: Mild peribronchial thickening noted.  Lungs otherwise grossly clear. Electronically Signed   By: Roanna Raider M.D.   On: 12/09/2015 17:54   Ct Abdomen Pelvis W Contrast  Result Date: 12/09/2015 CLINICAL DATA:  Right lower quadrant pain and nausea beginning yesterday. Knee surgery 1 week ago. EXAM: CT ABDOMEN AND PELVIS WITH CONTRAST TECHNIQUE: Multidetector CT imaging of the abdomen and pelvis was performed using the standard protocol following bolus administration of intravenous contrast. CONTRAST:  ISOVUE-300 IOPAMIDOL (ISOVUE-300) INJECTION 61% COMPARISON:  05/13/2010 FINDINGS: Lower chest: Normal Hepatobiliary: Normal Pancreas: Normal Spleen: Normal Adrenals/Urinary Tract: Adrenal glands are normal. Kidneys are normal except for a few tiny cysts of no significance. Stomach/Bowel: There is acute appendicitis. Stranding in the regional fat without evidence of frank abscess. No other acute bowel finding. Diverticulosis of the cul. Vascular/Lymphatic: Aortic atherosclerosis. No aneurysm. The IVC is normal. No adenopathy. Reproductive: Previous hysterectomy. Other: No free fluid or air. Musculoskeletal: Ordinary lower lumbar degenerative changes. IMPRESSION: Acute appendicitis with regional inflammatory changes but no frank abscess. Electronically Signed   By: Paulina Fusi M.D.   On: 12/09/2015 19:49    Procedures Procedures (including critical care time)  Medications Ordered in ED Medications  ondansetron (ZOFRAN-ODT) disintegrating tablet 4 mg (4 mg Oral Given 12/09/15 1724)  acetaminophen (TYLENOL) tablet 650 mg (650 mg Oral Given 12/09/15 1725)  morphine 4 MG/ML injection 4 mg (4 mg Intravenous Given 12/09/15 1842)    iopamidol (ISOVUE-300) 61 % injection 100 mL (100 mLs Intravenous Contrast Given 12/09/15 1914)     Initial Impression / Assessment and Plan / ED Course  I have reviewed the triage vital signs and the nursing notes.  Pertinent labs & imaging results that were available during my care of the patient were reviewed by me and considered in my medical decision making (see chart for details).  Clinical Course  BP 113/60   Pulse 79   Temp 100.4 F (38 C) (Oral)   Resp 18   Ht 5' 5.5" (1.664 m)   Wt 68 kg   LMP 02/20/1986 (Approximate)   SpO2 96%   BMI 24.58 kg/m    Final Clinical Impressions(s) / ED Diagnoses   Final diagnoses:  Acute appendicitis, unspecified acute appendicitis type    New Prescriptions New Prescriptions   No medications on file   6:09 PM Patient presents with right lower quadrant abdominal pain, nausea, fever and chills. Pain is reproducible on exam. She recently had a left total knee replacement nearly 10 days ago but no symptoms can pain at that site. No chest pain, shortness of breath, productive cough to suggest pneumonia or PE. Plan to obtain abdominal and pelvis CT scan for further evaluation. IV fluid given. Pain medication given. Will monitor closely.  8:29 PM  Pt with leukocytosis WBC 26, abd/pelvis CT scan with finding consistent with acute uncomplicated appendicitis.  Her pain is currently controlled.  Last meal was last night.  Pt made NPO, appreciate consultation from General Surgery Dr. Maisie Fus who agrees to see pt in the ER and will admit for further care.  Pt made aware of plan.  Care discussed with Dr. Deretha Emory.   Fayrene Helper, PA-C 12/09/15 2032    Vanetta Mulders, MD 12/10/15 2215

## 2015-12-09 NOTE — ED Provider Notes (Signed)
Medical screening examination/treatment/procedure(s) were conducted as a shared visit with non-physician practitioner(s) and myself.  I personally evaluated the patient during the encounter.   EKG Interpretation None      Results for orders placed or performed during the hospital encounter of 12/09/15  Comprehensive metabolic panel  Result Value Ref Range   Sodium 133 (L) 135 - 145 mmol/L   Potassium 3.9 3.5 - 5.1 mmol/L   Chloride 102 101 - 111 mmol/L   CO2 22 22 - 32 mmol/L   Glucose, Bld 116 (H) 65 - 99 mg/dL   BUN 24 (H) 6 - 20 mg/dL   Creatinine, Ser 4.130.68 0.44 - 1.00 mg/dL   Calcium 9.1 8.9 - 24.410.3 mg/dL   Total Protein 7.7 6.5 - 8.1 g/dL   Albumin 3.6 3.5 - 5.0 g/dL   AST 74 (H) 15 - 41 U/L   ALT 86 (H) 14 - 54 U/L   Alkaline Phosphatase 111 38 - 126 U/L   Total Bilirubin 0.8 0.3 - 1.2 mg/dL   GFR calc non Af Amer >60 >60 mL/min   GFR calc Af Amer >60 >60 mL/min   Anion gap 9 5 - 15  CBC with Differential  Result Value Ref Range   WBC 26.5 (H) 4.0 - 10.5 K/uL   RBC 3.43 (L) 3.87 - 5.11 MIL/uL   Hemoglobin 10.5 (L) 12.0 - 15.0 g/dL   HCT 01.031.6 (L) 27.236.0 - 53.646.0 %   MCV 92.1 78.0 - 100.0 fL   MCH 30.6 26.0 - 34.0 pg   MCHC 33.2 30.0 - 36.0 g/dL   RDW 64.414.0 03.411.5 - 74.215.5 %   Platelets 466 (H) 150 - 400 K/uL   Neutrophils Relative % 85 %   Lymphocytes Relative 6 %   Monocytes Relative 9 %   Eosinophils Relative 0 %   Basophils Relative 0 %   Neutro Abs 22.5 (H) 1.7 - 7.7 K/uL   Lymphs Abs 1.6 0.7 - 4.0 K/uL   Monocytes Absolute 2.4 (H) 0.1 - 1.0 K/uL   Eosinophils Absolute 0.0 0.0 - 0.7 K/uL   Basophils Absolute 0.0 0.0 - 0.1 K/uL   Smear Review MORPHOLOGY UNREMARKABLE   Lipase, blood  Result Value Ref Range   Lipase 17 11 - 51 U/L  I-Stat CG4 Lactic Acid, ED  Result Value Ref Range   Lactic Acid, Venous 0.75 0.5 - 1.9 mmol/L   Dg Chest 2 View  Result Date: 12/09/2015 CLINICAL DATA:  Acute onset of chills and body aches. Right lower quadrant abdominal pain and  nausea. EXAM: CHEST  2 VIEW COMPARISON:  CT of the chest performed 03/18/2009 FINDINGS: The lungs are well-aerated. Mild peribronchial thickening is noted. There is no evidence of focal opacification, pleural effusion or pneumothorax. The heart is borderline normal in size. No acute osseous abnormalities are seen. IMPRESSION: Mild peribronchial thickening noted.  Lungs otherwise grossly clear. Electronically Signed   By: Roanna RaiderJeffery  Chang M.D.   On: 12/09/2015 17:54    Patient with onset of right lower quadrant pain yesterday late afternoon. Patient with marked tenderness in the right lower quadrant. Marked leukocytosis. Patient with anorexia. My review of the CT scan is highly suspicious for appendicitis. Awaiting formal report.    Vanetta MuldersScott Zhoey Blackstock, MD 12/09/15 [phone]

## 2015-12-09 NOTE — ED Notes (Signed)
Pt. Is unable to urinate at this time, is aware we need a sample.

## 2015-12-09 NOTE — ED Triage Notes (Signed)
PT C/O RLQ PAIN WITH NAUSEA SINCE LAST NIGHT.PT STS SHE IS 1 WEEK S/P LEFT KNEE SURGERY. PT ALSO STS CHILLS AND BODY ACHES LAST NIGHT AS WELL. NO FEVER WITH SOFT STOOLS.

## 2015-12-09 NOTE — H&P (Signed)
Vicki Turner is an 65 y.o. female.   Chief Complaint: Right lower quadrant pain HPI: 65 year old female status post total knee replacement by Dr.: Approximately 9 days ago who presents to the hospital with worsening right lower quadrant pain for 24 hours. Patient denies any nausea or fevers.  Past Medical History:  Diagnosis Date  . Family history of adverse reaction to anesthesia    pt daughter very nauseous with anesthesia.  . H/O seasonal allergies    uses OTC med  . History of anemia    no problems now since Hysterectomy  . History of endometriosis   . Hypertension   . Migraine    Nadolol was use to help-no longer a problem.  . Osteopenia   . Recurrent sinus infections    last Spring 2017   . Vaginal prolapse    "wears pessary"    Past Surgical History:  Procedure Laterality Date  . NASAL SEPTUM SURGERY    . NASAL SINUS SURGERY  2002  . TOTAL ABDOMINAL HYSTERECTOMY  1998   DUB/endometriosis  . TOTAL KNEE ARTHROPLASTY Left 11/30/2015   Procedure: LEFT TOTAL KNEE ARTHROPLASTY;  Surgeon: Paralee Cancel, MD;  Location: [facility];  Service: Orthopedics;  Laterality: Left;  Adductor Block    Family History  Problem Relation Age of Onset  . Hypertension Maternal Grandfather   . Heart attack Father   . Miscarriages / Stillbirths Sister   . Transient ischemic attack Maternal Grandmother   . Osteoarthritis Mother    Social History:  reports that she has never smoked. She has never used smokeless tobacco. She reports that she drinks about 3.0 oz of alcohol per week . She reports that she does not use drugs.  Allergies:  Allergies  Allergen Reactions  . Novocain [Procaine] Hives and Other (See Comments)    Jittery feeling     (Not in a hospital admission)  Results for orders placed or performed during the hospital encounter of 12/09/15 (from the past 48 hour(s))  Urinalysis, Routine w reflex microscopic     Status: Abnormal   Collection Time: 12/09/15  5:16 PM  Result  Value Ref Range   Color, Urine YELLOW YELLOW   APPearance CLEAR CLEAR   Specific Gravity, Urine 1.035 (H) 1.005 - 1.030   pH 6.0 5.0 - 8.0   Glucose, UA NEGATIVE NEGATIVE mg/dL   Hgb urine dipstick NEGATIVE NEGATIVE   Bilirubin Urine NEGATIVE NEGATIVE   Ketones, ur 15 (A) NEGATIVE mg/dL   Protein, ur NEGATIVE NEGATIVE mg/dL   Nitrite NEGATIVE NEGATIVE   Leukocytes, UA NEGATIVE NEGATIVE    Comment: MICROSCOPIC NOT DONE ON URINES WITH NEGATIVE PROTEIN, BLOOD, LEUKOCYTES, NITRITE, OR GLUCOSE <1000 mg/dL.  Comprehensive metabolic panel     Status: Abnormal   Collection Time: 12/09/15  5:26 PM  Result Value Ref Range   Sodium 133 (L) 135 - 145 mmol/L   Potassium 3.9 3.5 - 5.1 mmol/L   Chloride 102 101 - 111 mmol/L   CO2 22 22 - 32 mmol/L   Glucose, Bld 116 (H) 65 - 99 mg/dL   BUN 24 (H) 6 - 20 mg/dL   Creatinine, Ser 0.68 0.44 - 1.00 mg/dL   Calcium 9.1 8.9 - 10.3 mg/dL   Total Protein 7.7 6.5 - 8.1 g/dL   Albumin 3.6 3.5 - 5.0 g/dL   AST 74 (H) 15 - 41 U/L   ALT 86 (H) 14 - 54 U/L   Alkaline Phosphatase 111 38 - 126 U/L  Total Bilirubin 0.8 0.3 - 1.2 mg/dL   GFR calc non Af Amer >60 >60 mL/min   GFR calc Af Amer >60 >60 mL/min    Comment: (NOTE) The eGFR has been calculated using the CKD EPI equation. This calculation has not been validated in all clinical situations. eGFR's persistently <60 mL/min signify possible Chronic Kidney Disease.    Anion gap 9 5 - 15  CBC with Differential     Status: Abnormal   Collection Time: 12/09/15  5:26 PM  Result Value Ref Range   WBC 26.5 (H) 4.0 - 10.5 K/uL   RBC 3.43 (L) 3.87 - 5.11 MIL/uL   Hemoglobin 10.5 (L) 12.0 - 15.0 g/dL   HCT 31.6 (L) 36.0 - 46.0 %   MCV 92.1 78.0 - 100.0 fL   MCH 30.6 26.0 - 34.0 pg   MCHC 33.2 30.0 - 36.0 g/dL   RDW 14.0 11.5 - 15.5 %   Platelets 466 (H) 150 - 400 K/uL   Neutrophils Relative % 85 %   Lymphocytes Relative 6 %   Monocytes Relative 9 %   Eosinophils Relative 0 %   Basophils Relative 0 %    Neutro Abs 22.5 (H) 1.7 - 7.7 K/uL   Lymphs Abs 1.6 0.7 - 4.0 K/uL   Monocytes Absolute 2.4 (H) 0.1 - 1.0 K/uL   Eosinophils Absolute 0.0 0.0 - 0.7 K/uL   Basophils Absolute 0.0 0.0 - 0.1 K/uL   Smear Review MORPHOLOGY UNREMARKABLE   Lipase, blood     Status: None   Collection Time: 12/09/15  5:26 PM  Result Value Ref Range   Lipase 17 11 - 51 U/L  I-Stat CG4 Lactic Acid, ED     Status: None   Collection Time: 12/09/15  5:37 PM  Result Value Ref Range   Lactic Acid, Venous 0.75 0.5 - 1.9 mmol/L   Dg Chest 2 View  Result Date: 12/09/2015 CLINICAL DATA:  Acute onset of chills and body aches. Right lower quadrant abdominal pain and nausea. EXAM: CHEST  2 VIEW COMPARISON:  CT of the chest performed 03/18/2009 FINDINGS: The lungs are well-aerated. Mild peribronchial thickening is noted. There is no evidence of focal opacification, pleural effusion or pneumothorax. The heart is borderline normal in size. No acute osseous abnormalities are seen. IMPRESSION: Mild peribronchial thickening noted.  Lungs otherwise grossly clear. Electronically Signed   By: Garald Balding M.D.   On: 12/09/2015 17:54   Ct Abdomen Pelvis W Contrast  Result Date: 12/09/2015 CLINICAL DATA:  Right lower quadrant pain and nausea beginning yesterday. Knee surgery 1 week ago. EXAM: CT ABDOMEN AND PELVIS WITH CONTRAST TECHNIQUE: Multidetector CT imaging of the abdomen and pelvis was performed using the standard protocol following bolus administration of intravenous contrast. CONTRAST:  164m ISOVUE-300 IOPAMIDOL (ISOVUE-300) INJECTION 61% COMPARISON:  05/13/2010 FINDINGS: Lower chest: Normal Hepatobiliary: Normal Pancreas: Normal Spleen: Normal Adrenals/Urinary Tract: Adrenal glands are normal. Kidneys are normal except for a few tiny cysts of no significance. Stomach/Bowel: There is acute appendicitis. Stranding in the regional fat without evidence of frank abscess. No other acute bowel finding. Diverticulosis of the cul.  Vascular/Lymphatic: Aortic atherosclerosis. No aneurysm. The IVC is normal. No adenopathy. Reproductive: Previous hysterectomy. Other: No free fluid or air. Musculoskeletal: Ordinary lower lumbar degenerative changes. IMPRESSION: Acute appendicitis with regional inflammatory changes but no frank abscess. Electronically Signed   By: MNelson ChimesM.D.   On: 12/09/2015 19:49    Review of Systems  Constitutional: Negative for chills and  fever.  Eyes: Negative for blurred vision.  Respiratory: Negative for cough and shortness of breath.   Cardiovascular: Negative for chest pain.  Gastrointestinal: Positive for abdominal pain. Negative for nausea and vomiting.  Genitourinary: Negative for dysuria, frequency and urgency.  Skin: Negative for rash.  Neurological: Negative for dizziness and headaches.    Blood pressure 113/57, pulse 78, temperature 99.1 F (37.3 C), temperature source Oral, resp. rate 18, height 5' 5.5" (1.664 m), weight 68 kg (150 lb), last menstrual period 02/20/1986, SpO2 98 %. Physical Exam  Constitutional: She is oriented to person, place, and time. She appears well-developed and well-nourished.  HENT:  Head: Normocephalic and atraumatic.  Eyes: Conjunctivae and EOM are normal. Pupils are equal, round, and reactive to light.  Neck: Normal range of motion. Neck supple.  Cardiovascular: Normal rate and regular rhythm.   Respiratory: Effort normal and breath sounds normal.  GI: Soft. There is tenderness (RLQ). There is no rebound and no guarding.  Musculoskeletal: Normal range of motion.  Neurological: She is alert and oriented to person, place, and time.  Skin: Skin is warm and dry.     Assessment/Plan 65 year old female with approximately 24 hours of signs and symptoms consistent with acute appendicitis. I recommended operative intervention. Unfortunately the operating room so are tied up at the moment.  I will admit to the floor. Nothing by mouth after midnight. We will  start IV antibiotics and IV fluids. Pain and nausea control. We'll plan on surgery first thing in the morning. Given her high white count, we will recheck her labs tomorrow morning as well.     Rosario Adie., MD 41/66/0630, 9:07 PM

## 2015-12-10 ENCOUNTER — Encounter (HOSPITAL_COMMUNITY): Payer: Self-pay | Admitting: Anesthesiology

## 2015-12-10 ENCOUNTER — Encounter (HOSPITAL_COMMUNITY): Admission: EM | Disposition: A | Discharge status: Home Health | Payer: Self-pay | Source: Home / Self Care

## 2015-12-10 ENCOUNTER — Observation Stay (HOSPITAL_COMMUNITY): Payer: Medicare Other | Admitting: Anesthesiology

## 2015-12-10 DIAGNOSIS — G43909 Migraine, unspecified, not intractable, without status migrainosus: Secondary | ICD-10-CM | POA: Diagnosis present

## 2015-12-10 DIAGNOSIS — Z96652 Presence of left artificial knee joint: Secondary | ICD-10-CM | POA: Diagnosis present

## 2015-12-10 DIAGNOSIS — K3580 Unspecified acute appendicitis: Secondary | ICD-10-CM | POA: Diagnosis present

## 2015-12-10 DIAGNOSIS — Z8249 Family history of ischemic heart disease and other diseases of the circulatory system: Secondary | ICD-10-CM | POA: Diagnosis not present

## 2015-12-10 DIAGNOSIS — M8580 Other specified disorders of bone density and structure, unspecified site: Secondary | ICD-10-CM | POA: Diagnosis present

## 2015-12-10 DIAGNOSIS — I10 Essential (primary) hypertension: Secondary | ICD-10-CM | POA: Diagnosis present

## 2015-12-10 DIAGNOSIS — Z9049 Acquired absence of other specified parts of digestive tract: Secondary | ICD-10-CM

## 2015-12-10 DIAGNOSIS — Z886 Allergy status to analgesic agent status: Secondary | ICD-10-CM | POA: Diagnosis not present

## 2015-12-10 HISTORY — PX: LAPAROSCOPIC APPENDECTOMY: SHX408

## 2015-12-10 LAB — BASIC METABOLIC PANEL
Anion gap: 9 (ref 5–15)
BUN: 23 mg/dL — ABNORMAL HIGH (ref 6–20)
CALCIUM: 8.9 mg/dL (ref 8.9–10.3)
CO2: 21 mmol/L — AB (ref 22–32)
CREATININE: 0.77 mg/dL (ref 0.44–1.00)
Chloride: 104 mmol/L (ref 101–111)
Glucose, Bld: 99 mg/dL (ref 65–99)
Potassium: 3.6 mmol/L (ref 3.5–5.1)
SODIUM: 134 mmol/L — AB (ref 135–145)

## 2015-12-10 LAB — CBC
HCT: 28.3 % — ABNORMAL LOW (ref 36.0–46.0)
Hemoglobin: 9.4 g/dL — ABNORMAL LOW (ref 12.0–15.0)
MCH: 30.5 pg (ref 26.0–34.0)
MCHC: 33.2 g/dL (ref 30.0–36.0)
MCV: 91.9 fL (ref 78.0–100.0)
PLATELETS: 435 10*3/uL — AB (ref 150–400)
RBC: 3.08 MIL/uL — ABNORMAL LOW (ref 3.87–5.11)
RDW: 14.2 % (ref 11.5–15.5)
WBC: 23 10*3/uL — AB (ref 4.0–10.5)

## 2015-12-10 LAB — BLOOD CULTURE ID PANEL (REFLEXED)
Acinetobacter baumannii: NOT DETECTED
CANDIDA ALBICANS: NOT DETECTED
CANDIDA GLABRATA: NOT DETECTED
CANDIDA KRUSEI: NOT DETECTED
CANDIDA PARAPSILOSIS: NOT DETECTED
CANDIDA TROPICALIS: NOT DETECTED
ENTEROBACTER CLOACAE COMPLEX: NOT DETECTED
ENTEROBACTERIACEAE SPECIES: NOT DETECTED
ESCHERICHIA COLI: NOT DETECTED
Enterococcus species: NOT DETECTED
Haemophilus influenzae: NOT DETECTED
KLEBSIELLA OXYTOCA: NOT DETECTED
KLEBSIELLA PNEUMONIAE: NOT DETECTED
Listeria monocytogenes: NOT DETECTED
Methicillin resistance: NOT DETECTED
NEISSERIA MENINGITIDIS: NOT DETECTED
PROTEUS SPECIES: NOT DETECTED
Pseudomonas aeruginosa: NOT DETECTED
STAPHYLOCOCCUS SPECIES: DETECTED — AB
STREPTOCOCCUS PYOGENES: NOT DETECTED
STREPTOCOCCUS SPECIES: NOT DETECTED
Serratia marcescens: NOT DETECTED
Staphylococcus aureus (BCID): NOT DETECTED
Streptococcus agalactiae: NOT DETECTED
Streptococcus pneumoniae: NOT DETECTED

## 2015-12-10 SURGERY — APPENDECTOMY, LAPAROSCOPIC
Anesthesia: General | Site: Abdomen

## 2015-12-10 MED ORDER — FAMOTIDINE IN NACL 20-0.9 MG/50ML-% IV SOLN
20.0000 mg | Freq: Two times a day (BID) | INTRAVENOUS | Status: DC
  Administered 2015-12-10 – 2015-12-11 (×3): 20 mg via INTRAVENOUS
  Filled 2015-12-10 (×2): qty 50

## 2015-12-10 MED ORDER — DEXAMETHASONE SODIUM PHOSPHATE 10 MG/ML IJ SOLN
INTRAMUSCULAR | Status: DC | PRN
  Administered 2015-12-10: 10 mg via INTRAVENOUS

## 2015-12-10 MED ORDER — ROCURONIUM BROMIDE 50 MG/5ML IV SOSY
PREFILLED_SYRINGE | INTRAVENOUS | Status: AC
  Filled 2015-12-10: qty 5

## 2015-12-10 MED ORDER — DIPHENHYDRAMINE HCL 50 MG/ML IJ SOLN: 12.5000 mg | Freq: Four times a day (QID) | INTRAMUSCULAR | Status: DC | PRN

## 2015-12-10 MED ORDER — MORPHINE SULFATE (PF) 2 MG/ML IV SOLN
1.0000 mg | INTRAVENOUS | Status: DC | PRN
  Administered 2015-12-10 – 2015-12-12 (×3): 1 mg via INTRAVENOUS
  Filled 2015-12-10 (×3): qty 1

## 2015-12-10 MED ORDER — CEFOTETAN DISODIUM-DEXTROSE 2-2.08 GM-% IV SOLR
INTRAVENOUS | Status: AC
  Filled 2015-12-10: qty 50

## 2015-12-10 MED ORDER — HYDROMORPHONE HCL 1 MG/ML IJ SOLN
INTRAMUSCULAR | Status: DC | PRN
  Administered 2015-12-10 (×4): 0.5 mg via INTRAVENOUS

## 2015-12-10 MED ORDER — SUGAMMADEX SODIUM 200 MG/2ML IV SOLN
INTRAVENOUS | Status: DC | PRN
  Administered 2015-12-10: 200 mg via INTRAVENOUS

## 2015-12-10 MED ORDER — ONDANSETRON HCL 4 MG/2ML IJ SOLN
INTRAMUSCULAR | Status: DC | PRN
  Administered 2015-12-10: 4 mg via INTRAVENOUS

## 2015-12-10 MED ORDER — SUGAMMADEX SODIUM 200 MG/2ML IV SOLN
INTRAVENOUS | Status: AC
  Filled 2015-12-10: qty 2

## 2015-12-10 MED ORDER — FENTANYL CITRATE (PF) 100 MCG/2ML IJ SOLN
INTRAMUSCULAR | Status: DC | PRN
  Administered 2015-12-10: 50 ug via INTRAVENOUS
  Administered 2015-12-10: 100 ug via INTRAVENOUS
  Administered 2015-12-10: 50 ug via INTRAVENOUS

## 2015-12-10 MED ORDER — LIDOCAINE 2% (20 MG/ML) 5 ML SYRINGE
INTRAMUSCULAR | Status: DC | PRN
  Administered 2015-12-10: 100 mg via INTRAVENOUS

## 2015-12-10 MED ORDER — ONDANSETRON 4 MG PO TBDP: 4.0000 mg | ORAL_TABLET | Freq: Four times a day (QID) | ORAL | Status: DC | PRN

## 2015-12-10 MED ORDER — DIPHENHYDRAMINE HCL 12.5 MG/5ML PO ELIX: 12.5000 mg | ORAL_SOLUTION | Freq: Four times a day (QID) | ORAL | Status: DC | PRN

## 2015-12-10 MED ORDER — ROCURONIUM BROMIDE 10 MG/ML (PF) SYRINGE
PREFILLED_SYRINGE | INTRAVENOUS | Status: DC | PRN
  Administered 2015-12-10: 40 mg via INTRAVENOUS
  Administered 2015-12-10: 5 mg via INTRAVENOUS

## 2015-12-10 MED ORDER — ONDANSETRON HCL 4 MG/2ML IJ SOLN
INTRAMUSCULAR | Status: AC
  Filled 2015-12-10: qty 2

## 2015-12-10 MED ORDER — MIDAZOLAM HCL 2 MG/2ML IJ SOLN
INTRAMUSCULAR | Status: DC | PRN
  Administered 2015-12-10: 2 mg via INTRAVENOUS

## 2015-12-10 MED ORDER — FENTANYL CITRATE (PF) 100 MCG/2ML IJ SOLN
INTRAMUSCULAR | Status: AC
  Filled 2015-12-10: qty 2

## 2015-12-10 MED ORDER — BUPIVACAINE HCL (PF) 0.5 % IJ SOLN
INTRAMUSCULAR | Status: AC
  Filled 2015-12-10: qty 30

## 2015-12-10 MED ORDER — ONDANSETRON HCL 4 MG/2ML IJ SOLN: 4.0000 mg | Freq: Four times a day (QID) | INTRAMUSCULAR | Status: DC | PRN

## 2015-12-10 MED ORDER — LACTATED RINGERS IR SOLN
Status: DC | PRN
  Administered 2015-12-10: 1000 mL

## 2015-12-10 MED ORDER — HYDROMORPHONE HCL 2 MG/ML IJ SOLN
INTRAMUSCULAR | Status: AC
  Filled 2015-12-10: qty 1

## 2015-12-10 MED ORDER — FENTANYL CITRATE (PF) 100 MCG/2ML IJ SOLN
25.0000 ug | INTRAMUSCULAR | Status: DC | PRN
  Administered 2015-12-10 (×2): 50 ug via INTRAVENOUS

## 2015-12-10 MED ORDER — PROPOFOL 10 MG/ML IV BOLUS
INTRAVENOUS | Status: DC | PRN
  Administered 2015-12-10: 100 mg via INTRAVENOUS

## 2015-12-10 MED ORDER — PROPOFOL 10 MG/ML IV BOLUS
INTRAVENOUS | Status: AC
  Filled 2015-12-10: qty 20

## 2015-12-10 MED ORDER — MIDAZOLAM HCL 2 MG/2ML IJ SOLN
INTRAMUSCULAR | Status: AC
  Filled 2015-12-10: qty 2

## 2015-12-10 MED ORDER — HEPARIN SODIUM (PORCINE) 5000 UNIT/ML IJ SOLN
5000.0000 [IU] | Freq: Three times a day (TID) | INTRAMUSCULAR | Status: DC
  Administered 2015-12-10 – 2015-12-12 (×6): 5000 [IU] via SUBCUTANEOUS
  Filled 2015-12-10 (×6): qty 1

## 2015-12-10 MED ORDER — 0.9 % SODIUM CHLORIDE (POUR BTL) OPTIME
TOPICAL | Status: DC | PRN
  Administered 2015-12-10: 1000 mL

## 2015-12-10 MED ORDER — BUPIVACAINE HCL (PF) 0.5 % IJ SOLN
INTRAMUSCULAR | Status: DC | PRN
  Administered 2015-12-10: 30 mL

## 2015-12-10 MED ORDER — METRONIDAZOLE IN NACL 5-0.79 MG/ML-% IV SOLN
INTRAVENOUS | Status: AC
  Filled 2015-12-10: qty 100

## 2015-12-10 MED ORDER — SODIUM CHLORIDE 0.9 % IJ SOLN
INTRAMUSCULAR | Status: AC
  Filled 2015-12-10: qty 10

## 2015-12-10 MED ORDER — KCL IN DEXTROSE-NACL 20-5-0.45 MEQ/L-%-% IV SOLN
INTRAVENOUS | Status: DC
  Administered 2015-12-10 – 2015-12-11 (×2): via INTRAVENOUS
  Filled 2015-12-10 (×3): qty 1000

## 2015-12-10 MED ORDER — LACTATED RINGERS IV SOLN
INTRAVENOUS | Status: DC | PRN
  Administered 2015-12-10: 1000 mL
  Administered 2015-12-10: 07:00:00 via INTRAVENOUS

## 2015-12-10 MED ORDER — HYDRALAZINE HCL 20 MG/ML IJ SOLN: 10.0000 mg | INTRAMUSCULAR | Status: DC | PRN

## 2015-12-10 MED ORDER — DEXTROSE 5 % IV SOLN
2.0000 g | INTRAVENOUS | Status: DC
  Administered 2015-12-10 – 2015-12-11 (×2): 2 g via INTRAVENOUS
  Filled 2015-12-10 (×2): qty 2

## 2015-12-10 MED ORDER — ONDANSETRON HCL 4 MG/2ML IJ SOLN: 4.0000 mg | Freq: Once | INTRAMUSCULAR | Status: DC | PRN

## 2015-12-10 MED ORDER — DEXAMETHASONE SODIUM PHOSPHATE 10 MG/ML IJ SOLN
INTRAMUSCULAR | Status: AC
  Filled 2015-12-10: qty 1

## 2015-12-10 MED ORDER — FENTANYL CITRATE (PF) 100 MCG/2ML IJ SOLN
INTRAMUSCULAR | Status: AC
  Filled 2015-12-10: qty 4

## 2015-12-10 MED ORDER — METRONIDAZOLE IN NACL 5-0.79 MG/ML-% IV SOLN
500.0000 mg | Freq: Three times a day (TID) | INTRAVENOUS | Status: DC
  Administered 2015-12-10 – 2015-12-12 (×6): 500 mg via INTRAVENOUS
  Filled 2015-12-10 (×6): qty 100

## 2015-12-10 MED ORDER — OXYCODONE HCL 5 MG PO TABS: 5.0000 mg | ORAL_TABLET | Freq: Once | ORAL | Status: DC | PRN

## 2015-12-10 MED ORDER — OXYCODONE HCL 5 MG/5ML PO SOLN: 5.0000 mg | Freq: Once | ORAL | Status: DC | PRN

## 2015-12-10 MED ORDER — OXYCODONE-ACETAMINOPHEN 5-325 MG PO TABS
1.0000 | ORAL_TABLET | ORAL | Status: DC | PRN
  Administered 2015-12-10 – 2015-12-12 (×9): 2 via ORAL
  Filled 2015-12-10 (×9): qty 2

## 2015-12-10 MED ORDER — DEXTROSE 5 % IV SOLN
2.0000 g | Freq: Once | INTRAVENOUS | Status: AC
  Administered 2015-12-10: 2 g via INTRAVENOUS

## 2015-12-10 SURGICAL SUPPLY — 42 items
ADH SKN CLS APL DERMABOND .7 (GAUZE/BANDAGES/DRESSINGS) ×1
APPLIER CLIP ROT 10 11.4 M/L (STAPLE)
APR CLP MED LRG 11.4X10 (STAPLE)
BAG SPEC RTRVL 10 TROC 200 (ENDOMECHANICALS)
BAG SPEC RTRVL LRG 6X4 10 (ENDOMECHANICALS)
CABLE HIGH FREQUENCY MONO STRZ (ELECTRODE) IMPLANT
CHLORAPREP W/TINT 26ML (MISCELLANEOUS) ×3 IMPLANT
CLIP APPLIE ROT 10 11.4 M/L (STAPLE) IMPLANT
COVER SURGICAL LIGHT HANDLE (MISCELLANEOUS) ×3 IMPLANT
CUTTER FLEX LINEAR 45M (STAPLE) ×2 IMPLANT
DECANTER SPIKE VIAL GLASS SM (MISCELLANEOUS) ×3 IMPLANT
DERMABOND ADVANCED (GAUZE/BANDAGES/DRESSINGS) ×2
DERMABOND ADVANCED .7 DNX12 (GAUZE/BANDAGES/DRESSINGS) IMPLANT
DRAPE LAPAROSCOPIC ABDOMINAL (DRAPES) ×3 IMPLANT
ELECT REM PT RETURN 9FT ADLT (ELECTROSURGICAL) ×3
ELECTRODE REM PT RTRN 9FT ADLT (ELECTROSURGICAL) ×1 IMPLANT
ENDOLOOP SUT PDS II  0 18 (SUTURE)
ENDOLOOP SUT PDS II 0 18 (SUTURE) IMPLANT
GLOVE BIOGEL M 8.0 STRL (GLOVE) ×3 IMPLANT
GOWN STRL REUS W/TWL XL LVL3 (GOWN DISPOSABLE) ×6 IMPLANT
IRRIG SUCT STRYKERFLOW 2 WTIP (MISCELLANEOUS) ×3
IRRIGATION SUCT STRKRFLW 2 WTP (MISCELLANEOUS) ×1 IMPLANT
KIT BASIN OR (CUSTOM PROCEDURE TRAY) ×3 IMPLANT
POUCH RETRIEVAL ECOSAC 10 (ENDOMECHANICALS) IMPLANT
POUCH RETRIEVAL ECOSAC 10MM (ENDOMECHANICALS)
POUCH SPECIMEN RETRIEVAL 10MM (ENDOMECHANICALS) IMPLANT
RELOAD 45 VASCULAR/THIN (ENDOMECHANICALS) ×3 IMPLANT
RELOAD STAPLE 45 2.5 WHT GRN (ENDOMECHANICALS) IMPLANT
RELOAD STAPLE 45 3.5 BLU ETS (ENDOMECHANICALS) IMPLANT
RELOAD STAPLE TA45 3.5 REG BLU (ENDOMECHANICALS) IMPLANT
SCISSORS LAP 5X45 EPIX DISP (ENDOMECHANICALS) IMPLANT
SHEARS HARMONIC ACE PLUS 45CM (MISCELLANEOUS) ×3 IMPLANT
SLEEVE XCEL OPT CAN 5 100 (ENDOMECHANICALS) IMPLANT
STAPLER VISISTAT 35W (STAPLE) IMPLANT
SUT MNCRL AB 4-0 PS2 18 (SUTURE) ×4 IMPLANT
SUT VIC AB 4-0 SH 18 (SUTURE) ×3 IMPLANT
TOWEL OR 17X26 10 PK STRL BLUE (TOWEL DISPOSABLE) ×3 IMPLANT
TRAY LAPAROSCOPIC (CUSTOM PROCEDURE TRAY) ×3 IMPLANT
TROCAR BLADELESS OPT 5 100 (ENDOMECHANICALS) ×3 IMPLANT
TROCAR XCEL BLUNT TIP 100MML (ENDOMECHANICALS) ×3 IMPLANT
TROCAR XCEL NON-BLD 11X100MML (ENDOMECHANICALS) IMPLANT
TUBING INSUF HEATED (TUBING) ×3 IMPLANT

## 2015-12-10 NOTE — Progress Notes (Signed)
Pt. Does not feel like she has been adequately informed about the surgery. States she has not been told exactly what was going to happen nor the possible complications. Consent form filled out and will send it down so that the patient can sign it after talking to the surgeon.

## 2015-12-10 NOTE — Anesthesia Postprocedure Evaluation (Signed)
Anesthesia Post Note  Patient: Vicki Turner  Procedure(s) Performed: Procedure(s) (LRB): APPENDECTOMY LAPAROSCOPIC (N/A)  Patient location during evaluation: PACU Anesthesia Type: General Level of consciousness: awake, awake and alert and oriented Pain management: pain level controlled Vital Signs Assessment: post-procedure vital signs reviewed and stable Respiratory status: spontaneous breathing, respiratory function stable and patient connected to nasal cannula oxygen Cardiovascular status: blood pressure returned to baseline Anesthetic complications: no    Last Vitals:  Vitals:   12/10/15 1223 12/10/15 1356  BP: 110/60 114/62  Pulse: 67 73  Resp: 20 20  Temp: 36.9 C 36.4 C    Last Pain:  Vitals:   12/10/15 1530  TempSrc:   PainSc: 3                  Omari Mcmanaway COKER

## 2015-12-10 NOTE — Progress Notes (Signed)
PHARMACY - PHYSICIAN COMMUNICATION CRITICAL VALUE ALERT - BLOOD CULTURE IDENTIFICATION (BCID)  Results for orders placed or performed during the hospital encounter of 12/09/15  Blood Culture ID Panel (Reflexed) (Collected: 12/09/2015  5:28 PM)  Result Value Ref Range   Enterococcus species NOT DETECTED NOT DETECTED   Listeria monocytogenes NOT DETECTED NOT DETECTED   Staphylococcus species DETECTED (A) NOT DETECTED   Staphylococcus aureus NOT DETECTED NOT DETECTED   Methicillin resistance NOT DETECTED NOT DETECTED   Streptococcus species NOT DETECTED NOT DETECTED   Streptococcus agalactiae NOT DETECTED NOT DETECTED   Streptococcus pneumoniae NOT DETECTED NOT DETECTED   Streptococcus pyogenes NOT DETECTED NOT DETECTED   Acinetobacter baumannii NOT DETECTED NOT DETECTED   Enterobacteriaceae species NOT DETECTED NOT DETECTED   Enterobacter cloacae complex NOT DETECTED NOT DETECTED   Escherichia coli NOT DETECTED NOT DETECTED   Klebsiella oxytoca NOT DETECTED NOT DETECTED   Klebsiella pneumoniae NOT DETECTED NOT DETECTED   Proteus species NOT DETECTED NOT DETECTED   Serratia marcescens NOT DETECTED NOT DETECTED   Haemophilus influenzae NOT DETECTED NOT DETECTED   Neisseria meningitidis NOT DETECTED NOT DETECTED   Pseudomonas aeruginosa NOT DETECTED NOT DETECTED   Candida albicans NOT DETECTED NOT DETECTED   Candida glabrata NOT DETECTED NOT DETECTED   Candida krusei NOT DETECTED NOT DETECTED   Candida parapsilosis NOT DETECTED NOT DETECTED   Candida tropicalis NOT DETECTED NOT DETECTED    Name of physician (or Provider) Contacted: [place]  Changes to prescribed antibiotics required: none, 1/2 CoNS likely contamination as no clinical correlation to current medical problems.  Kaveri Perras A 12/10/2015  6:07 PM

## 2015-12-10 NOTE — Transfer of Care (Signed)
Immediate Anesthesia Transfer of Care Note  Patient: Vicki Turner  Procedure(s) Performed: Procedure(s): APPENDECTOMY LAPAROSCOPIC (N/A)  Patient Location: PACU  Anesthesia Type:General  Level of Consciousness: awake, alert  and oriented  Airway & Oxygen Therapy: Patient Spontanous Breathing and Patient connected to face mask oxygen  Post-op Assessment: Report given to RN and Post -op Vital signs reviewed and stable  Post vital signs: Reviewed and stable  Last Vitals:  Vitals:   12/09/15 2302 12/10/15 0530  BP: (!) 116/58 119/62  Pulse: 81 81  Resp: 16 16  Temp: 37.3 C 37.6 C    Last Pain:  Vitals:   12/10/15 0530  TempSrc: Oral  PainSc:       Patients Stated Pain Goal: 2 (12/09/15 2346)  Complications: No apparent anesthesia complications

## 2015-12-10 NOTE — Anesthesia Preprocedure Evaluation (Addendum)
Anesthesia Evaluation  Patient identified by MRN, date of birth, ID band Patient awake    Reviewed: Allergy & Precautions, NPO status , Patient's Chart, lab work & pertinent test results  Airway Mallampati: II  TM Distance: >3 FB Neck ROM: Full    Dental  (+) Teeth Intact, Dental Advisory Given   Pulmonary    breath sounds clear to auscultation       Cardiovascular hypertension,  Rhythm:Regular Rate:Normal     Neuro/Psych    GI/Hepatic   Endo/Other    Renal/GU      Musculoskeletal   Abdominal   Peds  Hematology   Anesthesia Other Findings   Reproductive/Obstetrics                            Anesthesia Physical Anesthesia Plan  ASA: II and emergent  Anesthesia Plan: General   Post-op Pain Management:    Induction: Intravenous  Airway Management Planned: Oral ETT  Additional Equipment:   Intra-op Plan:   Post-operative Plan: Extubation in OR  Informed Consent: I have reviewed the patients History and Physical, chart, labs and discussed the procedure including the risks, benefits and alternatives for the proposed anesthesia with the patient or authorized representative who has indicated his/her understanding and acceptance.   Dental advisory given  Plan Discussed with: CRNA and Anesthesiologist  Anesthesia Plan Comments:         Anesthesia Quick Evaluation

## 2015-12-10 NOTE — Interval H&P Note (Signed)
History and Physical Interval Note:  12/10/2015 7:30 AM  Vicki MoatsEllen P Sciandra  has presented today for surgery, with the diagnosis of appendicitis  The various methods of treatment have been discussed with the patient and family. After consideration of risks, benefits and other options for treatment, the patient has consented to  Procedure(s): APPENDECTOMY LAPAROSCOPIC (N/A) as a surgical intervention .  The patient's history has been reviewed, patient examined, no change in status, stable for surgery.  I have reviewed the patient's chart and labs.  Questions were answered to the patient's satisfaction.     [name]

## 2015-12-10 NOTE — Anesthesia Procedure Notes (Signed)
Procedure Name: Intubation Date/Time: 12/10/2015 7:42 AM Performed by: Leroy LibmanEARDON, Iviona Hole L Patient Re-evaluated:Patient Re-evaluated prior to inductionOxygen Delivery Method: Circle system utilized Preoxygenation: Pre-oxygenation with 100% oxygen Intubation Type: IV induction Ventilation: Mask ventilation without difficulty and Oral airway inserted - appropriate to patient size Laryngoscope Size: Hyacinth MeekerMiller and 2 Grade View: Grade I Tube type: Oral Tube size: 7.5 mm Number of attempts: 1 Airway Equipment and Method: Stylet Placement Confirmation: ETT inserted through vocal cords under direct vision,  positive ETCO2 and breath sounds checked- equal and bilateral Secured at: 21 cm Tube secured with: Tape Dental Injury: Teeth and Oropharynx as per pre-operative assessment

## 2015-12-10 NOTE — Op Note (Signed)
Surgeon: Wenda LowMatt Anber Mckiver, MD, FACS  Asst:  none  Anes:  general  Procedure: Laparoscopic appendectomy for suppurative appendicitis  Diagnosis: Acute appendicitis  Complications: none  EBL:   minimal cc  Drains: none  Description of Procedure:  [facility]  After anesthesia was administered and the patient was prepped a timeout was performed.  Access achieved with Hasson technique through the umbilicus.  Two 5 mm ports were placed in the right upper quadrant and in the left lower quadrant.  The appendix had walled off and near the base was necrotic.  The base was isolated and in order to get a cleaner approach at the base, I went ahead and divided the mesentery with the Harmonic scalpel.  With vascular load, I used the Ethicon 4.5 cm and divided the appendix at the cecum.  The stump appeared viable.  The area was irrigated and the suction tip placed in the pelvis while her head was elevated.  Irrigant was clear.  Port sites were injected with Marcaine.  Adhesions in the lower midline were taken down.  The umbilical fascia was closed with 0 vicryl interrupted.  Wounds irrigated and closed with 4-0 monocryl and Dermabond.    The patient tolerated the procedure well and was taken to the PACU in stable condition.     Matt B. Daphine DeutscherMartin, MD, [facility], [place] [phone]

## 2015-12-11 LAB — BASIC METABOLIC PANEL
ANION GAP: 5 (ref 5–15)
BUN: 11 mg/dL (ref 6–20)
CALCIUM: 8.5 mg/dL — AB (ref 8.9–10.3)
CO2: 23 mmol/L (ref 22–32)
Chloride: 107 mmol/L (ref 101–111)
Creatinine, Ser: 0.62 mg/dL (ref 0.44–1.00)
GFR calc Af Amer: >60 mL/min (ref >60)
GLUCOSE: 120 mg/dL — AB (ref 65–99)
POTASSIUM: 3.8 mmol/L (ref 3.5–5.1)
SODIUM: 135 mmol/L (ref 135–145)

## 2015-12-11 LAB — CBC
HCT: 23.7 % — ABNORMAL LOW (ref 36.0–46.0)
Hemoglobin: 7.9 g/dL — ABNORMAL LOW (ref 12.0–15.0)
MCH: 31.3 pg (ref 26.0–34.0)
MCHC: 33.3 g/dL (ref 30.0–36.0)
MCV: 94 fL (ref 78.0–100.0)
PLATELETS: 398 10*3/uL (ref 150–400)
RBC: 2.52 MIL/uL — AB (ref 3.87–5.11)
RDW: 14.4 % (ref 11.5–15.5)
WBC: 16.9 10*3/uL — AB (ref 4.0–10.5)

## 2015-12-11 LAB — URINE CULTURE

## 2015-12-11 MED ORDER — FERROUS SULFATE 325 (65 FE) MG PO TABS
325.0000 mg | ORAL_TABLET | Freq: Three times a day (TID) | ORAL | Status: DC
  Administered 2015-12-11 – 2015-12-12 (×3): 325 mg via ORAL
  Filled 2015-12-11 (×3): qty 1

## 2015-12-11 MED ORDER — DOCUSATE SODIUM 100 MG PO CAPS: 100.0000 mg | ORAL_CAPSULE | Freq: Two times a day (BID) | ORAL | 0 refills | Status: AC

## 2015-12-11 MED ORDER — DOCUSATE SODIUM 100 MG PO CAPS
100.0000 mg | ORAL_CAPSULE | Freq: Two times a day (BID) | ORAL | Status: DC
  Administered 2015-12-11 – 2015-12-12 (×2): 100 mg via ORAL
  Filled 2015-12-11 (×2): qty 1

## 2015-12-11 MED ORDER — AMOXICILLIN-POT CLAVULANATE 875-125 MG PO TABS: 1.0000 | ORAL_TABLET | Freq: Two times a day (BID) | ORAL | 0 refills | Status: DC

## 2015-12-11 MED ORDER — HYDROCODONE-ACETAMINOPHEN 5-325 MG PO TABS: 1.0000 | ORAL_TABLET | Freq: Four times a day (QID) | ORAL | 0 refills | Status: DC | PRN

## 2015-12-11 NOTE — Progress Notes (Signed)
Called to check on pt for discharge. Still having some knee and abdominal pain. Tolerating diet and PT. She is not willing to go home because she is concerned about her hemoglobin. I reassured her this morning and again this afternoon that she has no clinical signs of ongoing bleeding and that the majority of our patients have a small decrease like this as a result of the dilutional effects of IV fluids.  -Saline lock IV now -Repeat labs in AM

## 2015-12-11 NOTE — Evaluation (Signed)
Physical Therapy Evaluation Patient Details Name: Vicki Turner MRN: [id number] DOB: 1950-08-12 Today's Date: 12/11/2015   History of Present Illness  Pt is a 65 y/o female s/p L TKA admitted for a laparoscopic appendectomy for suppurative appendicitis.  Clinical Impression  Pt admitted with above. Pt currently with functional limitations due to the deficits listed below (see PT Problem List). Pt will benefit from skilled PT to increase their independence and safety with mobility to allow discharge. Pt able to perform all mobility with at least supervision. Pt reports progressing with HHPT and is awaiting updated plan of care (continue HHPT vs. Begin OPPT).      Follow Up Recommendations Home health PT (pt already participating in HHPT; will continue upon d/c)    Equipment Recommendations  None recommended by PT    Recommendations for Other Services       Precautions / Restrictions Precautions Precautions: Fall;Knee Precaution Comments: pt educated to refrain from placing pillows under L knee or raising the lower portion of the bed to prevent contracture Restrictions Weight Bearing Restrictions: No Other Position/Activity Restrictions: WBAT      Mobility  Bed Mobility Overal bed mobility: Modified Independent Bed Mobility: Supine to Sit;Sit to Supine     Supine to sit: Modified independent (Device/Increase time) Sit to supine: Modified independent (Device/Increase time)   General bed mobility comments: no verbal cues required for safety or technique; increased time for bed mobility for comfort and safe technique  Transfers Overall transfer level: Needs assistance Equipment used: Rolling walker (2 wheeled) Transfers: Sit to/from Stand Sit to Stand: Supervision         General transfer comment: no verbal cues required for safety or technique; supervision for safety  Ambulation/Gait Ambulation/Gait assistance: Supervision Ambulation Distance (Feet): 140  Feet Assistive device: Rolling walker (2 wheeled) Gait Pattern/deviations: Step-through pattern;Decreased stride length     General Gait Details: no verbal cues required for safety or technique; pt reported increased L knee pain with ambulation  Stairs            Wheelchair Mobility    Modified Rankin (Stroke Patients Only)       Balance Overall balance assessment: No apparent balance deficits (not formally assessed);Needs assistance         Standing balance support: Bilateral upper extremity supported;During functional activity Standing balance-Leahy Scale: Poor Standing balance comment: RW required for UE support during ambulation and transfers; pt reported no falls at home since L TKA                             Pertinent Vitals/Pain Pain Assessment: 0-10 Pain Score: 3  Pain Location: L knee (discomfort and pulling/tightness in abdomen) Pain Descriptors / Indicators: Aching;Sore Pain Intervention(s): Limited activity within patient's tolerance;Monitored during session;Premedicated before session;Repositioned    Home Living Family/patient expects to be discharged to:: Private residence Living Arrangements: Spouse/significant other Available Help at Discharge: Family;Available PRN/intermittently (spouse) Type of Home: House Home Access: Stairs to enter Entrance Stairs-Rails: Right Entrance Stairs-Number of Steps: 3 Home Layout: One level Home Equipment: Walker - 4 wheels;Cane - single point;Grab bars - tub/shower;Grab bars - toilet      Prior Function Level of Independence: Independent         Comments: prior to L TKA; has been walking with cane and supervision from spouse     Hand Dominance        Extremity/Trunk Assessment  Lower Extremity Assessment: LLE deficits/detail   LLE Deficits / Details: anticipated post-surgical weakness; able to perform SLR and AROM knee flexion approx 80 degrees (long sitting)      Communication   Communication: No difficulties  Cognition Arousal/Alertness: Awake/alert Behavior During Therapy: WFL for tasks assessed/performed Overall Cognitive Status: Within Functional Limits for tasks assessed                      General Comments      Exercises Total Joint Exercises Ankle Circles/Pumps: AROM;Both;10 reps Quad Sets: AROM;Left;10 reps Short Arc Quad: AROM;Left;10 reps Hip ABduction/ADduction: AROM;Left;10 reps Knee Flexion: AAROM;Left;10 reps (self-assist with sheet)   Assessment/Plan    PT Assessment Patient needs continued PT services  PT Problem List Decreased strength;Decreased range of motion;Decreased mobility;Decreased knowledge of use of DME          PT Treatment Interventions DME instruction;Gait training;Stair training;Functional mobility training;Therapeutic activities;Therapeutic exercise;Patient/family education    PT Goals (Current goals can be found in the Care Plan section)  Acute Rehab PT Goals PT Goal Formulation: With patient Time For Goal Achievement: 12/18/15 Potential to Achieve Goals: Good    Frequency Min 3X/week   Barriers to discharge        Co-evaluation               End of Session Equipment Utilized During Treatment: Gait belt Activity Tolerance: Patient tolerated treatment well Patient left: in bed;with call bell/phone within reach           Time: 0841-0910 PT Time Calculation (min) (ACUTE ONLY): 29 min   Charges:   PT Evaluation $PT Eval Low Complexity: 1 Procedure PT Treatments $Therapeutic Exercise: 8-22 mins   PT G CodesKaralee Height January 04, 2016, 10:02 AM Karalee Height, SPT

## 2015-12-11 NOTE — Progress Notes (Signed)
S: No acute events. Pain improved. No nausea or emesis.   Vitals, labs, intake/output, and orders reviewed at this time. WBC 16.9 from 23, hgb 7.9 from 9.4, plt 398 from 435- some element of dilution. Afebrile. HR 72.  Gen: A&Ox3, no distress  H&N: EOMI, atraumatic, neck supple Chest: unlabored respirations, RRR Abd: soft, appropriately tender, nondistended, incision c/d/i with dermabond. No erythema.  Ext: warm, no edema Neuro: grossly normal  Lines/tubes/drains: PIV  A/P:  POD 1 s/p lap appendectomy for suppurative appendicitis. Continue to mobilize, see how breakfast goes. Possible dc this afternoon on PO abx.    Vicki Blakeshelsea Nevia Henkin, MD [facility], [place] Pager [phone]

## 2015-12-12 LAB — CBC
HCT: 25.7 % — ABNORMAL LOW (ref 36.0–46.0)
Hemoglobin: 8.7 g/dL — ABNORMAL LOW (ref 12.0–15.0)
MCH: 31.8 pg (ref 26.0–34.0)
MCHC: 33.9 g/dL (ref 30.0–36.0)
MCV: 93.8 fL (ref 78.0–100.0)
PLATELETS: 514 10*3/uL — AB (ref 150–400)
RBC: 2.74 MIL/uL — ABNORMAL LOW (ref 3.87–5.11)
RDW: 14.4 % (ref 11.5–15.5)
WBC: 9.3 10*3/uL (ref 4.0–10.5)

## 2015-12-12 LAB — CULTURE, BLOOD (ROUTINE X 2)

## 2015-12-12 NOTE — Progress Notes (Signed)
Patient discharged.  Leaving with three prescriptions and personal belongings.  Patient and her husband report understanding of discharge instructions.  Patient will have [facility] for Physical Therapy.  Denies pain.  A&O x4, room air, no s/s of distress.  No complaints.

## 2015-12-12 NOTE — Care Management Note (Signed)
Case Management Note  Patient Details  Name: Vicki Turner MRN: [id number] Date of Birth: 04/10/1950  Subjective/Objective:   S/p laparoscopic appendectomy                  Action/Plan: Discharge Planning: AVS reviewed: NCM spoke to pt and scheduled dc home [facility] Contacted [facility] to make aware pt dc home today. Pt has RW at home. Husband at home to assist with her care.  Expected Discharge Date:  12/12/2015           Expected Discharge Plan:  Home w Home Health Services  In-House Referral:  NA  Discharge planning Services  CM Consult  Post Acute Care Choice:  Home Health Choice offered to:  Patient  DME Arranged:  N/A DME Agency:  NA  HH Arranged:  PT HH Agency:  [facility] (formerly [facility])  Status of Service:  Completed, signed off  If discussed at [employer] Meetings, dates discussed:    Additional Comments:  Elliot CousinShavis, Kallee Nam Maydelin, RN 12/12/2015, 11:39 AM

## 2015-12-12 NOTE — Care Management Important Message (Signed)
Important Message  Patient Details  Name: Vicki Turner MRN: [id number] Date of Birth: 1950/11/09   Medicare Important Message Given:  Yes    Elliot CousinShavis, Corlette Ciano Loretto, RN 12/12/2015, 11:43 AM

## 2015-12-12 NOTE — Discharge Summary (Signed)
Physician Discharge Summary  Patient ID: Vicki Turner MRN: [id number] DOB/AGE: 08/29/1950 65 y.o.  Admit date: 12/09/2015 Discharge date: 12/12/2015  Admission Diagnoses: Acute appendicitis  Discharge Diagnoses:  Principal Problem:   S/P laparoscopic appendectomyOct 2017 Active Problems:   Acute appendicitis   Discharged Condition: good  Hospital Course: She was admitted with acute appendicitis and underwent laparoscopic appendectomy on 12/10/15 which was without complication. On POD 1 she was tolerating a diet and working with Pt. She declined to go home as she was concerned about her labs. On POD 2 her labs had improved and she was stable for discharge.   Consults: None  Significant Diagnostic Studies: labs: see chart and radiology: CT scan: see chart  Treatments: IV hydration, antibiotics, analgesia, and surgery: laproscopic appendectomy  Discharge Exam: Blood pressure 139/72, pulse 69, temperature 98.4 F (36.9 C), temperature source Oral, resp. rate 18, height 5' 5.5" (1.664 m), weight 68 kg (150 lb), last menstrual period 02/20/1986, SpO2 95 %. General appearance: alert and cooperative Resp: clear to auscultation bilaterally Cardio: regular rate and rhythm GI: soft, non-tender; bowel sounds normal; no masses,  no organomegaly Extremities: extremities normal, atraumatic, no cyanosis or edema and left knee incision dressing intact Skin: Skin color, texture, turgor normal. No rashes or lesions Neurologic: Grossly normal Incision/Wound:c/d/i with dermabond.   Disposition: 01-Home or Self Care  Discharge Instructions    Discharge patient    Complete by:  As directed        Medication List    TAKE these medications   amoxicillin-clavulanate 875-125 MG tablet Commonly known as:  AUGMENTIN Take 1 tablet by mouth 2 (two) times daily.   aspirin 81 MG chewable tablet Chew 1 tablet (81 mg total) by mouth 2 (two) times daily.   Calcipotriene 0.005 % solution Apply  1 application topically 2 (two) times daily as needed (for ezcema.).   clobetasol 0.05 % external solution Commonly known as:  TEMOVATE Apply 1 application topically 2 (two) times daily as needed (APPLY TO SCALP 1 TO 2 TIMES A DAY FOR FLARES).   docusate sodium 100 MG capsule Commonly known as:  COLACE Take 1 capsule (100 mg total) by mouth 2 (two) times daily. What changed:  Another medication with the same name was added. Make sure you understand how and when to take each.   docusate sodium 100 MG capsule Commonly known as:  COLACE Take 1 capsule (100 mg total) by mouth 2 (two) times daily. What changed:  You were already taking a medication with the same name, and this prescription was added. Make sure you understand how and when to take each.   ferrous sulfate 325 (65 FE) MG tablet Take 1 tablet (325 mg total) by mouth 3 (three) times daily after meals.   fluticasone 50 MCG/ACT nasal spray Commonly known as:  FLONASE Place 1-2 sprays into both nostrils daily as needed (for nasal congestion.).   gabapentin 100 MG capsule Commonly known as:  NEURONTIN TAKE 1 CAPSULE (100 MG TOTAL) BY MOUTH AT BEDTIME.   HYDROcodone-acetaminophen 7.5-325 MG tablet Commonly known as:  NORCO Take 1-2 tablets by mouth every 4 (four) hours as needed for moderate pain. What changed:  Another medication with the same name was added. Make sure you understand how and when to take each.   HYDROcodone-acetaminophen 5-325 MG tablet Commonly known as:  NORCO/VICODIN Take 1 tablet by mouth every 6 (six) hours as needed for moderate pain. What changed:  You were already taking a medication with  the same name, and this prescription was added. Make sure you understand how and when to take each.   loratadine 10 MG tablet Commonly known as:  CLARITIN Take 10 mg by mouth daily.   methocarbamol 500 MG tablet Commonly known as:  ROBAXIN Take 1 tablet (500 mg total) by mouth every 6 (six) hours as needed for  muscle spasms.   nadolol 20 MG tablet Commonly known as:  CORGARD TAKE 1 TABLET (20 MG TOTAL) BY MOUTH DAILY. What changed:  See the new instructions.   PARoxetine 10 MG tablet Commonly known as:  PAXIL TAKE 1.5 TABLETS (15 MG TOTAL) BY MOUTH DAILY.   polyethylene glycol packet Commonly known as:  MIRALAX / GLYCOLAX Take 17 g by mouth 2 (two) times daily.   SUMAtriptan 100 MG tablet Commonly known as:  IMITREX Take 1 tablet (100 mg total) by mouth every 2 (two) hours as needed for migraine.      Follow-up Information    [facility] .   Specialty:  Home Health Services Why:  Home Health Physical Therapy Contact information: [address] [place] [place] [address] [phone]         Follow up with [facility] in 2 weeks.   Signed: Berna Bue 12/12/2015, 8:32 AM

## 2015-12-12 NOTE — Discharge Instructions (Signed)
[facility] ______[facility] °LAPAROSCOPIC SURGERY: POST OP INSTRUCTIONS °Always review your discharge instruction sheet given to you by the facility where your surgery was performed. °IF YOU HAVE DISABILITY OR FAMILY LEAVE FORMS, YOU MUST BRING THEM TO THE OFFICE FOR PROCESSING.   °DO NOT GIVE THEM TO YOUR DOCTOR. ° °1. A prescription for pain medication may be given to you upon discharge.  Take your pain medication as prescribed, if needed.  If narcotic pain medicine is not needed, then you may take acetaminophen (Tylenol) or ibuprofen (Advil) as needed. °2. Take your usually prescribed medications unless otherwise directed. °3. If you need a refill on your pain medication, please contact your pharmacy.  They will contact our office to request authorization. Prescriptions will not be filled after 5pm or on week-ends. °4. You should follow a light diet the first few days after arrival home, such as soup and crackers, etc.  Be sure to include lots of fluids daily. °5. Most patients will experience some swelling and bruising in the area of the incisions.  Ice packs will help.  Swelling and bruising can take several days to resolve.  °6. It is common to experience some constipation if taking pain medication after surgery.  Increasing fluid intake and taking a stool softener (such as Colace) will usually help or prevent this problem from occurring.  A mild laxative (Milk of Magnesia or Miralax) should be taken according to package instructions if there are no bowel movements after 48 hours. °7. Unless discharge instructions indicate otherwise, you may remove your bandages 24-48 hours after surgery, and you may shower at that time.  You may have steri-strips (small skin tapes) in place directly over the incision.  These strips should be left on the skin for 7-10 days.  If your surgeon used skin glue on the incision, you may shower in 24 hours.  The glue will flake off over the next 2-3 weeks.  Any sutures or  staples will be removed at the office during your follow-up visit. °8. ACTIVITIES:  You may resume regular (light) daily activities beginning the next day--such as daily self-care, walking, climbing stairs--gradually increasing activities as tolerated.  You may have sexual intercourse when it is comfortable.  Refrain from any heavy lifting or straining until approved by your doctor. °a. You may drive when you are no longer taking prescription pain medication, you can comfortably wear a seatbelt, and you can safely maneuver your car and apply brakes. °b. RETURN TO WORK:  __1 week________________________________________________________ °9. You should see your doctor in the office for a follow-up appointment approximately 2-3 weeks after your surgery.  Make sure that you call for this appointment within a day or two after you arrive home to insure a convenient appointment time. °10. OTHER INSTRUCTIONS: __________________________________________________________________________________________________________________________ __________________________________________________________________________________________________________________________ °WHEN TO CALL YOUR DOCTOR: °1. Fever over 101.0 °2. Inability to urinate °3. Continued bleeding from incision. °4. Increased pain, redness, or drainage from the incision. °5. Increasing abdominal pain ° °The clinic staff is available to answer your questions during regular business hours.  Please don’t hesitate to call and ask to speak to one of the nurses for clinical concerns.  If you have a medical emergency, go to the nearest emergency room or call 911.  A surgeon from [facility] is always on call at the hospital. °[address], [address], [place], [place]  [address] ? [address] [address], [place],    [address] °[phone] ? [phone] ? FAX [phone] °Web   site: [email or web address] ° °

## 2015-12-14 LAB — CULTURE, BLOOD (ROUTINE X 2): Culture: NO GROWTH

## 2016-01-04 ENCOUNTER — Other Ambulatory Visit: Payer: Self-pay | Admitting: Obstetrics & Gynecology

## 2016-01-04 DIAGNOSIS — Z1231 Encounter for screening mammogram for malignant neoplasm of breast: Secondary | ICD-10-CM

## 2016-02-08 ENCOUNTER — Ambulatory Visit
Admission: RE | Admit: 2016-02-08 | Discharge: 2016-02-08 | Disposition: A | Discharge status: Home / Self Care | Payer: Medicare Other | Source: Ambulatory Visit | Attending: Obstetrics & Gynecology | Admitting: Obstetrics & Gynecology

## 2016-02-08 DIAGNOSIS — Z1231 Encounter for screening mammogram for malignant neoplasm of breast: Secondary | ICD-10-CM

## 2016-07-04 ENCOUNTER — Ambulatory Visit (INDEPENDENT_AMBULATORY_CARE_PROVIDER_SITE_OTHER): Payer: Medicare Other | Admitting: Obstetrics & Gynecology

## 2016-07-04 VITALS — BP 130/86 | HR 76 | Resp 16 | Ht 65.5 in | Wt 149.0 lb

## 2016-07-04 DIAGNOSIS — Z9289 Personal history of other medical treatment: Secondary | ICD-10-CM | POA: Diagnosis not present

## 2016-07-04 DIAGNOSIS — N393 Stress incontinence (female) (male): Secondary | ICD-10-CM | POA: Diagnosis not present

## 2016-07-04 DIAGNOSIS — N816 Rectocele: Secondary | ICD-10-CM | POA: Diagnosis not present

## 2016-07-04 DIAGNOSIS — N8111 Cystocele, midline: Secondary | ICD-10-CM | POA: Diagnosis not present

## 2016-07-04 DIAGNOSIS — R61 Generalized hyperhidrosis: Secondary | ICD-10-CM

## 2016-07-04 DIAGNOSIS — Z960 Presence of urogenital implants: Secondary | ICD-10-CM

## 2016-07-04 NOTE — Progress Notes (Signed)
GYNECOLOGY  VISIT   HPI: 66 y.o. 402P2002 Married Caucasian female here for two issues.  The first is hot flashes.  She has tried Paxil 10mg  that was started 3/16 and she thought it did help with her symptoms.  However, she discussed this at her [facility] with Shirlyn GoltzPatty Grubb 7/17.  She was tapered off her Paxil and then switched to gabapentin.  She is using 100mg  only.  Major issue is just at night.  Would like for this for to improve if possible.  Does not want to be on estrogens if possible.  Options reviewed.  Will plan to increase gabapentin to see if can get better control of night sweats/hot flashes.  Again, this is most bothersome to her at night.  She did have recent blood work done with PCP.  Not sure of results.  Would like to make sure thyroid and glucose have been tested.  Release will be signed.  Second issue is her cystocele.  She is ready for repair at this point.  She has been Dr. Edward JollySilva in consultation and would like to proceed with repair.  She is thinking about June, if possible.  She has not done urodynamics.  Dr. Rica RecordsSilva's notes from 4/17 and 5/17 indicate A&P repair with sling and cystoscopy would be treatment recommendations at that time. She was fitted with a pessary and this has worked "ok" but is bothersome to the pt.  She is now ready for surgical repair.  She wants to review the procedure and hospital stay and recovery.  Reviewed all of this as well as risks.  She will need to see Dr. Edward JollySilva again and may need urodynamics.  D/W pt that I will communicate this with Dr. Edward JollySilva and proceed with making appt with her in the near future.  Pt comfortable with plan.     D/w pt that this will all be done vaginally, unless there is a change in her physical exam so ovaries would remain unless this is a concern to her.  She voices that it is not.    GYNECOLOGIC HISTORY: Patient's last menstrual period was 02/20/1986 (approximate). Contraception: hysterectomy  Patient Active Problem List   Diagnosis  Date Noted  . S/P laparoscopic appendectomyOct 2017 12/10/2015  . Acute appendicitis 12/10/2015  . Overweight (BMI 25.0-29.9) 12/01/2015  . S/P left TKA 11/30/2015  . Migraine, unspecified, without mention of intractable migraine without mention of status migrainosus 09/02/2013  . History of endometriosis 09/02/2013    Past Medical History:  Diagnosis Date  . Family history of adverse reaction to anesthesia    pt daughter very nauseous with anesthesia.  . H/O seasonal allergies    uses OTC med  . History of anemia    no problems now since Hysterectomy  . History of endometriosis   . Hypertension   . Migraine    Nadolol was use to help-no longer a problem.  . Osteopenia   . Recurrent sinus infections    last Spring 2017   . Vaginal prolapse    "wears pessary"    Past Surgical History:  Procedure Laterality Date  . LAPAROSCOPIC APPENDECTOMY N/A 12/10/2015   Procedure: APPENDECTOMY LAPAROSCOPIC;  Surgeon: Luretha MurphyMatthew Martin, MD;  Location: [facility];  Service: General;  Laterality: N/A;  . NASAL SEPTUM SURGERY    . NASAL SINUS SURGERY  2002  . TOTAL ABDOMINAL HYSTERECTOMY  1998   DUB/endometriosis  . TOTAL KNEE ARTHROPLASTY Left 11/30/2015   Procedure: LEFT TOTAL KNEE ARTHROPLASTY;  Surgeon: Durene RomansMatthew Olin,  MD;  Location: [facility];  Service: Orthopedics;  Laterality: Left;  Adductor Block    MEDS:  Reviewed in EPIC and UTD  ALLERGIES: Novocain [procaine]  Family History  Problem Relation Age of Onset  . Hypertension Maternal Grandfather   . Heart attack Father   . Miscarriages / Stillbirths Sister   . Transient ischemic attack Maternal Grandmother   . Osteoarthritis Mother     SH:  Married, non smoker  Review of Systems  Genitourinary:       Incontinence and vaginal bugle  All other systems reviewed and are negative.   PHYSICAL EXAMINATION:    BP 130/86 (BP Location: Right Arm, Patient Position: Sitting, Cuff Size: Normal)   Pulse 76   Resp 16   Ht 5' 5.5" (1.664  m)   Wt 149 lb (67.6 kg)   LMP 02/20/1986 (Approximate)   BMI 24.42 kg/m     General appearance: alert, cooperative and appears stated age CV:  Regular rate and rhythm Lungs:  clear to auscultation, no wheezes, rales or rhonchi, symmetric air entry Pt declines pelvic exam as she is going to have this performs when she sees Dr. Edward Jolly  Assessment: Cystocele SUI H/o small rectcele Hot flashes/night sweats  Plan: Pt will return to see Dr. Edward Jolly for surgical planning.  She is aware she may need urodynamics testing. Will plan to increased gabapentin by 100mg  weekly up to 300mg  to see if this helps with hot flashes.  Pt does not need rx at this this time.   ~25 minutes spent with patient >50% of time was in face to face discussion of above.

## 2016-07-05 ENCOUNTER — Telehealth: Payer: Self-pay | Admitting: *Deleted

## 2016-07-05 NOTE — Telephone Encounter (Signed)
Reviewed surgery scheduling with Dr Hyacinth MeekerMiller and DR Edward JollySilva.  Last seen by Dr Edward JollySilva May 2017. Call to patient, advised office visit needed with Dr Edward JollySilva. Patient reports symptoms have not really worsened but leaks all the time, even with pessary and she is tired of it. Ready to for surgical correction. Consult with pelvic exam scheduled for 07-07-16 with Dr Edward JollySilva.  CC: Dr Hyacinth MeekerMiller.

## 2016-07-07 ENCOUNTER — Ambulatory Visit (INDEPENDENT_AMBULATORY_CARE_PROVIDER_SITE_OTHER): Payer: Medicare Other | Admitting: Obstetrics and Gynecology

## 2016-07-07 ENCOUNTER — Encounter: Payer: Self-pay | Admitting: Obstetrics & Gynecology

## 2016-07-07 ENCOUNTER — Encounter: Payer: Self-pay | Admitting: Obstetrics and Gynecology

## 2016-07-07 VITALS — BP 118/60 | HR 72 | Resp 16 | Wt 149.0 lb

## 2016-07-07 DIAGNOSIS — N3945 Continuous leakage: Secondary | ICD-10-CM | POA: Diagnosis not present

## 2016-07-07 DIAGNOSIS — N816 Rectocele: Secondary | ICD-10-CM

## 2016-07-07 DIAGNOSIS — N8110 Cystocele, unspecified: Secondary | ICD-10-CM

## 2016-07-07 DIAGNOSIS — N993 Prolapse of vaginal vault after hysterectomy: Secondary | ICD-10-CM

## 2016-07-07 NOTE — Progress Notes (Signed)
GYNECOLOGY  VISIT   HPI: 66 y.o.   Married  Caucasian  female   G2P2002 with Patient's last menstrual period was 02/20/1986 (approximate).   here to discuss scheduling surgery.   Last seen May 2017 and had third degree cystocele, first degree rectocele.    Uses incontinence dish.  Leaks all the time, even with it in place.  Can fall out if she sneezes or has a bowel movement.   Leaks with cough, laugh, sneeze, standing up from chair. Leaks really only with movement.  No leak for no reason at all.  DF - goes all the time to avoid leaking.  Every 1 - 1.5 hours. NF - Up once per night.  No enuresis.  Voiding well.   No UTIs.  No renal stones or hematuria.   No constipation or splinting.  No fecal incontinence.  Not very sexually active.   Likes to be active with her grandchildren.   Saw Wilda Young in past for PT.  Hx prior total abdominal hysterectomy for bleeding and endometriosis.  Ovaries remain.   Beach trip planned in August.   GYNECOLOGIC HISTORY: Patient's last menstrual period was 02/20/1986 (approximate). Contraception: Hysterectomy Menopausal hormone therapy:  none Last mammogram: 02-08-16 Density C/Neg/BiRads1:TBC Last pap smear: 12-03-06 Neg        OB History    Gravida Para Term Preterm AB Living   2 2 2  0 0 2   SAB TAB Ectopic Multiple Live Births   0 0 0 0 2         Patient Active Problem List   Diagnosis Date Noted  . S/P laparoscopic appendectomyOct 2017 12/10/2015  . Acute appendicitis 12/10/2015  . Overweight (BMI 25.0-29.9) 12/01/2015  . S/P left TKA 11/30/2015  . Migraine, unspecified, without mention of intractable migraine without mention of status migrainosus 09/02/2013  . History of endometriosis 09/02/2013    Past Medical History:  Diagnosis Date  . Family history of adverse reaction to anesthesia    pt daughter very nauseous with anesthesia.  . H/O seasonal allergies    uses OTC med  . History of anemia    no problems now  since Hysterectomy  . History of endometriosis   . Hypertension   . Migraine    Nadolol was use to help-no longer a problem.  . Osteopenia   . Recurrent sinus infections    last Spring 2017   . Vaginal prolapse    "wears pessary"    Past Surgical History:  Procedure Laterality Date  . LAPAROSCOPIC APPENDECTOMY N/A 12/10/2015   Procedure: APPENDECTOMY LAPAROSCOPIC;  Surgeon: Luretha Murphy, MD;  Location: [facility];  Service: General;  Laterality: N/A;  . NASAL SEPTUM SURGERY    . NASAL SINUS SURGERY  2002  . TOTAL ABDOMINAL HYSTERECTOMY  1998   DUB/endometriosis  . TOTAL KNEE ARTHROPLASTY Left 11/30/2015   Procedure: LEFT TOTAL KNEE ARTHROPLASTY;  Surgeon: Durene Romans, MD;  Location: [facility];  Service: Orthopedics;  Laterality: Left;  Adductor Block    Current Outpatient Prescriptions  Medication Sig Dispense Refill  . clobetasol (TEMOVATE) 0.05 % external solution Apply 1 application topically 2 (two) times daily as needed (APPLY TO SCALP 1 TO 2 TIMES A DAY FOR FLARES).    . fluticasone (FLONASE) 50 MCG/ACT nasal spray Place 1-2 sprays into both nostrils daily as needed (for nasal congestion.).     Marland Kitchen gabapentin (NEURONTIN) 100 MG capsule TAKE 1 CAPSULE (100 MG TOTAL) BY MOUTH AT BEDTIME. (Patient taking differently: Take  100 mg by mouth at bedtime. Take 2 tablets po qhs) 90 capsule 4  . loratadine (CLARITIN) 10 MG tablet Take 10 mg by mouth daily.    . nadolol (CORGARD) 20 MG tablet TAKE 1 TABLET (20 MG TOTAL) BY MOUTH DAILY. (Patient taking differently: TAKE 1 TABLET (20 MG TOTAL) BY MOUTH DAILY IN THE EVENING) 90 tablet 4  . SUMAtriptan (IMITREX) 100 MG tablet Take 1 tablet (100 mg total) by mouth every 2 (two) hours as needed for migraine. 9 tablet 4   No current facility-administered medications for this visit.      ALLERGIES: Novocain [procaine]  Family History  Problem Relation Age of Onset  . Hypertension Maternal Grandfather   . Heart attack Father   . Miscarriages /  Stillbirths Sister   . Transient ischemic attack Maternal Grandmother   . Osteoarthritis Mother     Social History   Social History  . Marital status: Married    Spouse name: N/A  . Number of children: N/A  . Years of education: N/A   Occupational History  . Not on file.   Social History Main Topics  . Smoking status: Never Smoker  . Smokeless tobacco: Never Used  . Alcohol use 3.0 oz/week    5 Standard drinks or equivalent per week     Comment: wine  . Drug use: No  . Sexual activity: Yes    Partners: Male    Birth control/ protection: Surgical     Comment: TAH   Other Topics Concern  . Not on file   Social History Narrative  . No narrative on file    ROS:  Pertinent items are noted in HPI.  PHYSICAL EXAMINATION:    BP 118/60 (BP Location: Right Arm, Patient Position: Sitting, Cuff Size: Normal)   Pulse 72   Resp 16   Wt 149 lb (67.6 kg)   LMP 02/20/1986 (Approximate)   BMI 24.42 kg/m     General appearance: alert, cooperative and appears stated age   Pelvic: External genitalia:  no lesions              Urethra:  normal appearing urethra with no masses, tenderness or lesions              Bartholins and Skenes: normal                 Vagina: normal appearing vagina with normal color and discharge, no lesions.. Third degree cystocele.  Vaginal vault prolapse first degree with valsalva.  First degree rectocele.               Cervix:  absent                Bimanual Exam:  Uterus: absent.              Adnexa: no mass, fullness, tenderness              Rectal exam: Yes.  .  Confirms.              Anus:  normal sphincter tone, no lesions  Chaperone was present for exam.  ASSESSMENT  Status post TAH.  Ovaries remain.  Cystocele, vault prolapse, rectocele.  Using incontinence dish. Stress incontinence.   PLAN  We discussed options for care including a cystocele repair with a midurethral sling/cystoscopy versus an abdominal or laparoscopic robotic  sacrocolpopexy with an anterior and posterior colporrhaphy and TVT Exact midurethral sling/cystoscopy.  Patient has concerns about the prolapse recurrence  and her active lifestyle and is leaning toward a more comprehensive approach to surgery and delaying it until later in the year when she does not have events and obligations. She understands that she will need 6 - 8 weeks of immediate surgical recovery and 12 weeks of no lifting over 10 pounds. We discussed the use of permanent mesh for the sacrocolpopexy and sling.  She received handouts from [facility] regarding prolapse and incontinence surgery.  She will reutrn for urodynamics.  Procedure explained.  She will use her pessary for this.   An After Visit Summary was printed and given to the patient.  ___40___ minutes face to face time of which over 50% was spent in counseling.

## 2016-07-10 ENCOUNTER — Telehealth: Payer: Self-pay | Admitting: Obstetrics & Gynecology

## 2016-07-10 NOTE — Telephone Encounter (Signed)
Call to patient to review benefit for urodynamic testing. Left voicemail for patient to return call to review benefit.  When patient returns call, please review benefit and [facility].   Routing to [employer]zy Dixon for notification.  Cc: Billie RuddySally Yeakley for scheduling

## 2016-07-11 ENCOUNTER — Telehealth: Payer: Self-pay | Admitting: Obstetrics and Gynecology

## 2016-07-11 NOTE — Telephone Encounter (Signed)
Call to patient. Surgery date options discussed. Patient considering plans for possible November surgery since she has multiple events already planned that would not allow for 6 week recovery.  Patient will discuss this with family and call back with decision. May even wait till Jan 2019. Will plan urodynamics once surgery date selected. Brief review of urodynamics procedure provided.  Routing to provider for final review. Patient agreeable to disposition. Will close encounter.

## 2016-07-11 NOTE — Telephone Encounter (Signed)
Spoke with patient regarding benefit for recommended surgery. Patient understood and agreeable. Patient aware this is professional benefit only. Patient aware, once surgery has been scheduled, she will be contacted by hospital for separate benefits. Patient advises she will review information with her family and will call back to advise how she would like to proceed.  Routing to [employer]ally Yeakley

## 2016-07-11 NOTE — Telephone Encounter (Signed)
See additional phone note from today. Patient is considering date options and will call back when ready to proceed.  Routing to provider for final review. Patient agreeable to disposition. Will close encounter.

## 2016-07-13 ENCOUNTER — Telehealth: Payer: Self-pay | Admitting: Obstetrics and Gynecology

## 2016-07-13 NOTE — Telephone Encounter (Signed)
Return call to patient regarding plans for surgery. She has decided she would like to plan for 12-25-16 surgery date. Advised will schedule surgery and call back to coordinate remaining appointments.

## 2016-07-13 NOTE — Telephone Encounter (Signed)
After review with Dr Edward JollySilva, patietn surgical plan includes possible abdominal versus laparoscopic/robotic assisted sacrocolpopexy.  [facility] Patient agreeable to date and location change of 01-01-17.

## 2016-07-13 NOTE — Telephone Encounter (Signed)
Patient is asking to talk with Kennon RoundsSally regarding scheduling her surgery.

## 2016-07-26 ENCOUNTER — Other Ambulatory Visit: Payer: Self-pay | Admitting: Obstetrics and Gynecology

## 2016-07-27 NOTE — Telephone Encounter (Signed)
Medication refill request: Conjugated Estrogens Vaginal Cream Last AEX:  09/15/15 PG Next AEX: 12/15/16 SM Last MMG (if hormonal medication request): 02/08/16 BIRADS1, Density C, [facility] Refill authorized: 07/09/15 #30g 2R. Discontinued on 12/01/15. Please advise. Thank you.   Routing to DL since PG is out today.

## 2016-08-03 NOTE — Telephone Encounter (Signed)
Surgery scheduled for 01-01-17. See next phone encounter.  Routing to provider for final review.  Will close encounter.

## 2016-08-10 ENCOUNTER — Telehealth: Payer: Self-pay | Admitting: Obstetrics & Gynecology

## 2016-08-10 NOTE — Telephone Encounter (Signed)
Medication refill request: gabapentin  Last AEX:  09/15/15 PG Next AEX: 12/15/16 SM  Last MMG (if hormonal medication request): 02/08/16 BIRADS1:neg  Refill authorized: 10/13/15 #90caps/4R. Today #90/1R?

## 2016-08-10 NOTE — Telephone Encounter (Signed)
Patient is requesting a new prescription for Gabapentin. She is using [facility] [address].

## 2016-08-11 MED ORDER — GABAPENTIN 300 MG PO CAPS: 300.0000 mg | ORAL_CAPSULE | Freq: Every day | ORAL | 3 refills | Status: DC

## 2016-08-11 NOTE — Telephone Encounter (Signed)
Can you please see what dosage she is taking?  If it is the 300mg , then I will change to one instead of taking three.  Can you also see how much the gabapentin has helped?  Thanks.

## 2016-08-11 NOTE — Telephone Encounter (Signed)
Patient states she is taking 100mg  TID.  Would like a 300mg  tablet to take once a day.

## 2016-09-11 ENCOUNTER — Other Ambulatory Visit: Payer: Self-pay | Admitting: *Deleted

## 2016-09-11 NOTE — Telephone Encounter (Signed)
Faxed refill request received from [facility] for 90-day supply GABAPENTIN Last filled by MD on 08/11/16, #30 X 3 RF Last AEX - 09/15/15 SM Next AEX - 12/15/16 SM  Please advise refills for 90 day supply. Thank you.

## 2016-09-13 MED ORDER — GABAPENTIN 300 MG PO CAPS: 300.0000 mg | ORAL_CAPSULE | Freq: Every day | ORAL | 1 refills | Status: DC

## 2016-10-13 ENCOUNTER — Telehealth: Payer: Self-pay | Admitting: Obstetrics and Gynecology

## 2016-10-13 NOTE — Telephone Encounter (Signed)
Patient would like to schedule her urodynamics test.

## 2016-10-13 NOTE — Telephone Encounter (Signed)
Spoke with patient. Advised office is working to set up date for urodynamics in September. Once this date has been set she will be contacted and scheduled. Patient verbalizes understanding.  Cc: Billie Ruddy, RN  Routing to provider for final review. Patient agreeable to disposition. Will close encounter.

## 2016-10-19 ENCOUNTER — Telehealth: Payer: Self-pay | Admitting: *Deleted

## 2016-10-19 NOTE — Telephone Encounter (Signed)
Call to patient. Urodynamics bladder testing form reviewed with patient and she verbalized understanding. Patient scheduled for urodynamics testing on Wednesday 11/01/16 at 0930. Nurse visit for urinalysis and urodynamics consult scheduled for patient. Patient also scheduled for pre op, 1 week post op, 6 week post op, and 12 week post op appointments. Patient agreeable to date and time of all appointments. Surgery information form reviewed with patient and she verbalized understanding. Patient aware a copy of surgery information form and urodynamics bladder testing form would be mailed to her.   Patient agreeable to disposition. Will close encounter.

## 2016-10-27 ENCOUNTER — Ambulatory Visit (INDEPENDENT_AMBULATORY_CARE_PROVIDER_SITE_OTHER): Payer: Medicare Other | Admitting: *Deleted

## 2016-10-27 VITALS — BP 126/80 | HR 64 | Resp 16 | Ht 65.5 in | Wt 154.0 lb

## 2016-10-27 DIAGNOSIS — N8110 Cystocele, unspecified: Secondary | ICD-10-CM

## 2016-10-27 LAB — POCT URINALYSIS DIPSTICK
Bilirubin, UA: NEGATIVE
Blood, UA: NEGATIVE
GLUCOSE UA: NEGATIVE
Ketones, UA: NEGATIVE
LEUKOCYTES UA: NEGATIVE
NITRITE UA: NEGATIVE
PROTEIN UA: NEGATIVE
UROBILINOGEN UA: 0.2 U/dL
pH, UA: 6 (ref 5.0–8.0)

## 2016-10-27 NOTE — Progress Notes (Signed)
Patient in today for UA check before urodynamics. Patient denies any symptoms of burning, frequency and urgency.  UA normal today.  Routed to Dr. Edward JollySilva  CC Dr. Oscar LaJertson since Dr. Edward JollySilva is out of the office.

## 2016-11-01 ENCOUNTER — Ambulatory Visit (INDEPENDENT_AMBULATORY_CARE_PROVIDER_SITE_OTHER): Payer: Medicare Other | Admitting: *Deleted

## 2016-11-01 VITALS — BP 110/70 | HR 60 | Resp 14 | Wt 154.0 lb

## 2016-11-01 DIAGNOSIS — N3945 Continuous leakage: Secondary | ICD-10-CM | POA: Diagnosis not present

## 2016-11-01 DIAGNOSIS — N816 Rectocele: Secondary | ICD-10-CM | POA: Diagnosis not present

## 2016-11-01 DIAGNOSIS — N993 Prolapse of vaginal vault after hysterectomy: Secondary | ICD-10-CM

## 2016-11-01 DIAGNOSIS — N8110 Cystocele, unspecified: Secondary | ICD-10-CM

## 2016-11-01 NOTE — Progress Notes (Signed)
Vicki Turner is a 66 y.o. female Who presents today for urodynamics testing, ordered by Dr. Edward JollySilva.   Allergies and medications reviewed.  Denies complaints today. No urinary complaints.   Urine Micro exam: negative for WBC's or RBC's, okay to proceed per Dr. Edward JollySilva.  Patient reports urinary leakage with coughing, sneezing, exercise.   Urodynamics testing initiated. Lumax Bladder Catheter #10 [place] and lumax Abdominal Catheter #10 [place].   Post void residual 5 ml.   #3 incontinence dish pessary placed prior to procedure. Urethral catheter placed without issue. Rectal catheter placed without issue.  Urodynamics testing completed. Catheters and pessary removed intact prior to patient leaving office. Please see scanned Patient summary report in Epic. Procedure completed and patient tolerated well without complaints. Patient scheduled for follow up office visit with Dr. Edward JollySilva to discuss results. Patient agreeable.   Patient given post procedure instructions:  You may have a mild bladder and rectal discomfort for a few hours after the test. You may experience some frequent urination and slight burning the first few times you urinate after the test. Rarely, the urine may be blood tinged. These are both due to catheter placements and resolve quickly. You should call our office immediately if you have signs of infection, which may include bladder pain, urinary urgency, fever, or burning during urination. We do encourage you to drink plenty of water after the test.

## 2016-11-10 ENCOUNTER — Ambulatory Visit (INDEPENDENT_AMBULATORY_CARE_PROVIDER_SITE_OTHER): Payer: Medicare Other | Admitting: Obstetrics and Gynecology

## 2016-11-10 ENCOUNTER — Telehealth: Payer: Self-pay | Admitting: Obstetrics & Gynecology

## 2016-11-10 ENCOUNTER — Encounter: Payer: Self-pay | Admitting: Obstetrics and Gynecology

## 2016-11-10 VITALS — BP 100/62 | HR 64 | Ht 65.5 in | Wt 154.4 lb

## 2016-11-10 DIAGNOSIS — N393 Stress incontinence (female) (male): Secondary | ICD-10-CM

## 2016-11-10 DIAGNOSIS — N993 Prolapse of vaginal vault after hysterectomy: Secondary | ICD-10-CM

## 2016-11-10 NOTE — Telephone Encounter (Signed)
Dr Edward Jolly would like patient to see Dr Hyacinth Meeker for follow up appointment.

## 2016-11-10 NOTE — Progress Notes (Signed)
GYNECOLOGY  VISIT   HPI: 66 y.o.   Married  Caucasian  female   G2P2002 with Patient's last menstrual period was 02/20/1986 (approximate).   here to review results of urodynamics performed on 11/01/16 with reduction of the prolapse.  Had urinary frequency and loss of urine spontaneously and with cough/sneeze.  Still struggling with significant night sweats.  On Gabapentin 300 mg nightly.   Just added a grand daughter to the family.   Referred by Dr. Leda Quail.   GYNECOLOGIC HISTORY: Patient's last menstrual period was 02/20/1986 (approximate). Contraception: Hysterectomy Menopausal hormone therapy:  none Last mammogram: 02-08-16 Density C/Neg/BiRads1:TBC Last pap smear:  12-03-06 Neg        OB History    Gravida Para Term Preterm AB Living   0 0 2   SAB TAB Ectopic Multiple Live Births   0 0 0 0 2         Patient Active Problem List   Diagnosis Date Noted  . S/P laparoscopic appendectomyOct 2017 12/10/2015  . Acute appendicitis 12/10/2015  . Overweight (BMI 25.0-29.9) 12/01/2015  . S/P left TKA 11/30/2015  . Migraine, unspecified, without mention of intractable migraine without mention of status migrainosus 09/02/2013  . History of endometriosis 09/02/2013    Past Medical History:  Diagnosis Date  . Family history of adverse reaction to anesthesia    pt daughter very nauseous with anesthesia.  . H/O seasonal allergies    uses OTC med  . History of anemia    no problems now since Hysterectomy  . History of endometriosis   . Hypertension   . Migraine    Nadolol was use to help-no longer a problem.  . Osteopenia   . Recurrent sinus infections    last Spring 2017   . Vaginal prolapse    "wears pessary"    Past Surgical History:  Procedure Laterality Date  . LAPAROSCOPIC APPENDECTOMY N/A 12/10/2015   Procedure: APPENDECTOMY LAPAROSCOPIC;  Surgeon: Luretha Murphy, MD;  Location: [facility];  Service: General;  Laterality: N/A;  . NASAL SEPTUM SURGERY     . NASAL SINUS SURGERY  2002  . TOTAL ABDOMINAL HYSTERECTOMY  1998   DUB/endometriosis  . TOTAL KNEE ARTHROPLASTY Left 11/30/2015   Procedure: LEFT TOTAL KNEE ARTHROPLASTY;  Surgeon: Durene Romans, MD;  Location: [facility];  Service: Orthopedics;  Laterality: Left;  Adductor Block    Current Outpatient Prescriptions  Medication Sig Dispense Refill  . clobetasol (TEMOVATE) 0.05 % external solution Apply 1 application topically 2 (two) times daily as needed (APPLY TO SCALP 1 TO 2 TIMES A DAY FOR FLARES).    Marland Kitchen conjugated estrogens (PREMARIN) vaginal cream 1/2 gm vaginally twice weekly only 30 g 0  . fluticasone (FLONASE) 50 MCG/ACT nasal spray Place 1-2 sprays into both nostrils daily as needed (for nasal congestion.).     Marland Kitchen gabapentin (NEURONTIN) 300 MG capsule Take 1 capsule (300 mg total) by mouth at bedtime. 90 capsule 1  . loratadine (CLARITIN) 10 MG tablet Take 10 mg by mouth daily.    . nadolol (CORGARD) 20 MG tablet TAKE 1 TABLET (20 MG TOTAL) BY MOUTH DAILY. (Patient taking differently: TAKE 1 TABLET (20 MG TOTAL) BY MOUTH DAILY IN THE EVENING) 90 tablet 4  . SUMAtriptan (IMITREX) 100 MG tablet Take 1 tablet (100 mg total) by mouth every 2 (two) hours as needed for migraine. 9 tablet 4   No current facility-administered medications for this visit.      ALLERGIES:  Novocain [procaine]  Family History  Problem Relation Age of Onset  . Hypertension Maternal Grandfather   . Heart attack Father   . Miscarriages / Stillbirths Sister   . Transient ischemic attack Maternal Grandmother   . Osteoarthritis Mother     Social History   Social History  . Marital status: Married    Spouse name: N/A  . Number of children: N/A  . Years of education: N/A   Occupational History  . Not on file.   Social History Main Topics  . Smoking status: Never Smoker  . Smokeless tobacco: Never Used  . Alcohol use 3.0 oz/week    5 Standard drinks or equivalent per week     Comment: wine  . Drug use:  No  . Sexual activity: Yes    Partners: Male    Birth control/ protection: Surgical     Comment: TAH   Other Topics Concern  . Not on file   Social History Narrative  . No narrative on file    ROS:  Pertinent items are noted in HPI.  PHYSICAL EXAMINATION:    BP 100/62 (BP Location: Right Arm, Patient Position: Sitting, Cuff Size: Normal)   Pulse 64   Ht 5' 5.5" (1.664 m)   Wt 154 lb 6.4 oz (70 kg)   LMP 02/20/1986 (Approximate)   BMI 25.30 kg/m     General appearance: alert, cooperative and appears stated age   Abdomen: Pfannenstiel incision, soft, non-tender, no masses,  no organomegaly   Pelvic: External genitalia:  no lesions              Urethra:  normal appearing urethra with no masses, tenderness or lesions              Bartholins and Skenes: normal                 Vagina: normal appearing vagina with normal color and discharge, no lesions.  Third degree cystocele, second degree vault prolapse, first degree rectocele.               Cervix: absent.                 Bimanual Exam:  Uterus:   Absent.               Adnexa: no mass, fullness, tenderness              Rectal exam: Yes.  .  Confirms.              Anus:  normal sphincter tone, no lesions  Chaperone was present for exam.  ASSESSMENT  Status post TAH.  Ovaries remain.  Cystocele, vault prolapse, rectocele.  Using incontinence dish. Stress incontinence.  Vasomotor symptoms persistent on Gabapentin.  Status post appy last year.  PLAN  I reviewed results of urodynamic test results showing stress incontinence and forced voiding pattern. Midurethral sling and cystoscopy reviewed in detail using a 3D pelvic model. I highlighted that this is a permanent mesh material. Risks of surgery include but are not limited to bleeding, infection, damage to surrounding organs, permanent mesh use which may cause erosion and exposure in the vagina, urethra, bladder or ureters, slower voiding and urinary retention,  possible need for prolonged catheterization and/or self catheterization, de novo overactive bladder symptoms, reoperation, and recurrence of incontinence.   I have discussed surgical expectations regarding the procedure and success rates, outcomes, and recovery. We also briefly reviewed the anticipated remainder of her surgery to  include robotic bilateral salpingo-oophorectomy, robotic sacrocolpopexy, anterior and posterior colporrhaphy.  She understands that a proctor will be there for the robotic sacrocolpopexy.  Continue vaginal estrogen twice weekly.   Return to see Dr. Hyacinth Meeker for management of vasomotor symptoms.   An After Visit Summary was printed and given to the patient.  __25____ minutes face to face time of which over 50% was spent in counseling.

## 2016-11-13 NOTE — Telephone Encounter (Signed)
Spoke with patient. Patient states she is taking 300 mg of Gabapentin and is waking up in the middle of the night due to night sweats. "I have days where I am soaking wet all night long." Dr.Silva recommended that patient return to see Dr.Miller to discuss symptoms. Appointment scheduled for 11/14/2016 at 3:30 pm with Dr.Miller. Patient is agreeable to date and time.  Routing to provider for final review. Patient agreeable to disposition. Will close encounter.

## 2016-11-14 ENCOUNTER — Ambulatory Visit (INDEPENDENT_AMBULATORY_CARE_PROVIDER_SITE_OTHER): Payer: Medicare Other | Admitting: Obstetrics & Gynecology

## 2016-11-14 VITALS — BP 128/80 | HR 68 | Resp 14 | Ht 65.5 in | Wt 154.0 lb

## 2016-11-14 DIAGNOSIS — R232 Flushing: Secondary | ICD-10-CM | POA: Diagnosis not present

## 2016-11-14 MED ORDER — CITALOPRAM HYDROBROMIDE 20 MG PO TABS: 20.0000 mg | ORAL_TABLET | Freq: Every day | ORAL | 1 refills | Status: DC

## 2016-11-14 NOTE — Progress Notes (Signed)
GYNECOLOGY  VISIT  CC:   Hot flashes  HPI: 66 y.o. G73P2002 Married Caucasian female here for complaint of hot flashes.  Has used Gabapentin  now since May.  Reports she does think this has helped some at night.  Will have two or three fully restful nights but the others are not.  Can wake up every 1-2 hours with hot flash, throwing off covers, then being wet and damp and cold.  Keeps temperature low.  Wears light clothing to bed.  Again, feels gabapentin has helped but not enough.  She really does not want to be on HRT and is really motivated for this to continue to improve.  She does not have hot flashes during the day.  GYNECOLOGIC HISTORY: Patient's last menstrual period was 02/20/1986 (approximate). Contraception: hysterectomy  Menopausal hormone therapy: none  Patient Active Problem List   Diagnosis Date Noted  . S/P laparoscopic appendectomyOct 2017 12/10/2015  . Acute appendicitis 12/10/2015  . Overweight (BMI 25.0-29.9) 12/01/2015  . S/P left TKA 11/30/2015  . Migraine, unspecified, without mention of intractable migraine without mention of status migrainosus 09/02/2013  . History of endometriosis 09/02/2013    Past Medical History:  Diagnosis Date  . Family history of adverse reaction to anesthesia    pt daughter very nauseous with anesthesia.  . H/O seasonal allergies    uses OTC med  . History of anemia    no problems now since Hysterectomy  . History of endometriosis   . Hypertension   . Migraine    Nadolol was use to help-no longer a problem.  . Osteopenia   . Recurrent sinus infections    last Spring 2017   . Vaginal prolapse    "wears pessary"    Past Surgical History:  Procedure Laterality Date  . LAPAROSCOPIC APPENDECTOMY N/A 12/10/2015   Procedure: APPENDECTOMY LAPAROSCOPIC;  Surgeon: Luretha Murphy, MD;  Location: [facility];  Service: General;  Laterality: N/A;  . NASAL SEPTUM SURGERY    . NASAL SINUS SURGERY  2002  . TOTAL ABDOMINAL HYSTERECTOMY   1998   DUB/endometriosis  . TOTAL KNEE ARTHROPLASTY Left 11/30/2015   Procedure: LEFT TOTAL KNEE ARTHROPLASTY;  Surgeon: Durene Romans, MD;  Location: [facility];  Service: Orthopedics;  Laterality: Left;  Adductor Block    MEDS:   Current Outpatient Prescriptions on File Prior to Visit  Medication Sig Dispense Refill  . clobetasol (TEMOVATE) 0.05 % external solution Apply 1 application topically 2 (two) times daily as needed (APPLY TO SCALP 1 TO 2 TIMES A DAY FOR FLARES).    Marland Kitchen conjugated estrogens (PREMARIN) vaginal cream 1/2 gm vaginally twice weekly only 30 g 0  . fluticasone (FLONASE) 50 MCG/ACT nasal spray Place 1-2 sprays into both nostrils daily as needed (for nasal congestion.).     Marland Kitchen gabapentin (NEURONTIN) 300 MG capsule Take 1 capsule (300 mg total) by mouth at bedtime. 90 capsule 1  . loratadine (CLARITIN) 10 MG tablet Take 10 mg by mouth daily.    . nadolol (CORGARD) 20 MG tablet TAKE 1 TABLET (20 MG TOTAL) BY MOUTH DAILY. (Patient taking differently: TAKE 1 TABLET (20 MG TOTAL) BY MOUTH DAILY IN THE EVENING) 90 tablet 4  . SUMAtriptan (IMITREX) 100 MG tablet Take 1 tablet (100 mg total) by mouth every 2 (two) hours as needed for migraine. 9 tablet 4   No current facility-administered medications on file prior to visit.     ALLERGIES: Novocain [procaine]  Family History  Problem Relation Age of  Onset  . Hypertension Maternal Grandfather   . Heart attack Father   . Miscarriages / Stillbirths Sister   . Transient ischemic attack Maternal Grandmother   . Osteoarthritis Mother     SH:  Married, non smoker  Review of Systems  Genitourinary: Positive for frequency and urgency.  All other systems reviewed and are negative.   PHYSICAL EXAMINATION:    BP 128/80 (BP Location: Right Arm, Patient Position: Sitting, Cuff Size: Normal)   Pulse 68   Resp 14   Ht 5' 5.5" (1.664 m)   Wt 154 lb (69.9 kg)   LMP 02/20/1986 (Approximate)   BMI 25.24 kg/m     General appearance:  alert, cooperative and appears stated age  Discussed with pt alternatives to HRT for hot flashes.  Effexor discussed.  Pt not interested in this.  Husband has been taking for years and has tried to stop on a couple of occasions and cannot due to side effects from withdrawal.  Other options reviewed with possible side effects.  Settled on Celexa.  Assessment: Hot flashes from menopause  Plan: Celexa  daily will be initiated.  She is going to take 1/2 tab for about a week and then increase to the full dosage.  Pt will give update in about 3 weeks.  Side effects reviewed.  Questions answered.   ~15 minutes spent with patient >50% of time was in face to face discussion of above.

## 2016-11-16 NOTE — Progress Notes (Signed)
Multichannel urodynamic testing with pessary to reduce prolapse  Uroflow - not continuous pattern.  PVR 5 cc.   CMG - S1 215 cc, S2 354 cc, S3 490 cc.  Stable CMG. VLPP 60 cm H2O at 498 cc.    UPP - 22 cm H2O at 503 cc.   Pressure flow study - P Det max 98 cm H2O with straining pattern.  Voided 569 cc.   Stress incontinence with low LPP.  Straining with voiding.

## 2016-11-17 ENCOUNTER — Encounter: Payer: Self-pay | Admitting: Obstetrics & Gynecology

## 2016-12-11 ENCOUNTER — Ambulatory Visit (INDEPENDENT_AMBULATORY_CARE_PROVIDER_SITE_OTHER): Payer: Medicare Other | Admitting: Obstetrics and Gynecology

## 2016-12-11 ENCOUNTER — Encounter: Payer: Self-pay | Admitting: Obstetrics and Gynecology

## 2016-12-11 VITALS — BP 120/78 | HR 60 | Ht 65.5 in | Wt 156.8 lb

## 2016-12-11 DIAGNOSIS — N393 Stress incontinence (female) (male): Secondary | ICD-10-CM

## 2016-12-11 DIAGNOSIS — N816 Rectocele: Secondary | ICD-10-CM | POA: Diagnosis not present

## 2016-12-11 DIAGNOSIS — N8110 Cystocele, unspecified: Secondary | ICD-10-CM

## 2016-12-11 NOTE — Patient Instructions (Addendum)
Please do a FLEETs enema the night before surgery.  Please purchase Colace and Miralax for after the surgery.  You will also need sanitary pads.

## 2016-12-11 NOTE — Progress Notes (Signed)
GYNECOLOGY  VISIT   HPI: 66 y.o.   Married  Caucasian  female   G2P2002 with Patient's last menstrual period was 02/20/1986 (approximate).   here for sugical consult.    Husband is present for the entire visit today.  Urinary incontinence with cough and sneeze. No constipation or fecal soiling.  Not very sexually active.   Urodynamic testing confirms stress incontinence. VLPP 60 cm H2O at 498 cc.  UPP 22 cm H20 at 503 cc.  Declines to continue with a pessary incontinence dish for her cystocele and stress incontinence.  Now taking Celexa for hot flashes.  Still taking Neurontin as well.   No other changes in her health.  States she does not have a hx of HTN.  States she can use Lidocaine.   Had her flu vaccine.   GYNECOLOGIC HISTORY: Patient's last menstrual period was 02/20/1986 (approximate). Contraception:  Hysterectomy Menopausal hormone therapy:  none Last mammogram:  02-08-16 Density C/Neg/BiRads1:TBC Last pap smear: 12-03-06 Neg        OB History    Gravida Para Term Preterm AB Living   2 2 2  0 0 2   SAB TAB Ectopic Multiple Live Births   0 0 0 0 2         Patient Active Problem List   Diagnosis Date Noted  . S/P laparoscopic appendectomyOct 2017 12/10/2015  . Acute appendicitis 12/10/2015  . Overweight (BMI 25.0-29.9) 12/01/2015  . S/P left TKA 11/30/2015  . Migraine, unspecified, without mention of intractable migraine without mention of status migrainosus 09/02/2013  . History of endometriosis 09/02/2013    Past Medical History:  Diagnosis Date  . Family history of adverse reaction to anesthesia    pt daughter very nauseous with anesthesia.  . H/O seasonal allergies    uses OTC med  . History of anemia    no problems now since Hysterectomy  . History of endometriosis   . Hypertension   . Migraine    Nadolol was use to help-no longer a problem.  . Osteopenia   . Recurrent sinus infections    last Spring 2017   . Vaginal prolapse    "wears  pessary"    Past Surgical History:  Procedure Laterality Date  . LAPAROSCOPIC APPENDECTOMY N/A 12/10/2015   Procedure: APPENDECTOMY LAPAROSCOPIC;  Surgeon: Luretha Murphy, MD;  Location: [facility];  Service: General;  Laterality: N/A;  . NASAL SEPTUM SURGERY    . NASAL SINUS SURGERY  2002  . TOTAL ABDOMINAL HYSTERECTOMY  1998   DUB/endometriosis  . TOTAL KNEE ARTHROPLASTY Left 11/30/2015   Procedure: LEFT TOTAL KNEE ARTHROPLASTY;  Surgeon: Durene Romans, MD;  Location: [facility];  Service: Orthopedics;  Laterality: Left;  Adductor Block    Current Outpatient Prescriptions  Medication Sig Dispense Refill  . citalopram (CELEXA) 20 MG tablet Take 1 tablet (20 mg total) by mouth daily. 30 tablet 1  . clobetasol (TEMOVATE) 0.05 % external solution Apply 1 application topically 2 (two) times daily as needed (APPLY TO SCALP 1 TO 2 TIMES A DAY FOR FLARES).    Marland Kitchen conjugated estrogens (PREMARIN) vaginal cream 1/2 gm vaginally twice weekly only 30 g 0  . fluticasone (FLONASE) 50 MCG/ACT nasal spray Place 1-2 sprays into both nostrils daily as needed (for nasal congestion.).     Marland Kitchen gabapentin (NEURONTIN) 300 MG capsule Take 1 capsule (300 mg total) by mouth at bedtime. 90 capsule 1  . loratadine (CLARITIN) 10 MG tablet Take 10 mg by mouth daily.    Marland Kitchen  nadolol (CORGARD) 20 MG tablet TAKE 1 TABLET (20 MG TOTAL) BY MOUTH DAILY. (Patient taking differently: TAKE 1 TABLET (20 MG TOTAL) BY MOUTH DAILY IN THE EVENING) 90 tablet 4  . SUMAtriptan (IMITREX) 100 MG tablet Take 1 tablet (100 mg total) by mouth every 2 (two) hours as needed for migraine. 9 tablet 4   No current facility-administered medications for this visit.      ALLERGIES: Novocain [procaine]  Family History  Problem Relation Age of Onset  . Hypertension Maternal Grandfather   . Heart attack Father   . Miscarriages / Stillbirths Sister   . Transient ischemic attack Maternal Grandmother   . Osteoarthritis Mother     Social History   Social  History  . Marital status: Married    Spouse name: N/A  . Number of children: N/A  . Years of education: N/A   Occupational History  . Not on file.   Social History Main Topics  . Smoking status: Never Smoker  . Smokeless tobacco: Never Used  . Alcohol use 3.0 oz/week    5 Standard drinks or equivalent per week     Comment: wine  . Drug use: No  . Sexual activity: Yes    Partners: Male    Birth control/ protection: Surgical     Comment: TAH   Other Topics Concern  . Not on file   Social History Narrative  . No narrative on file    ROS:  Pertinent items are noted in HPI.  PHYSICAL EXAMINATION:    BP 120/78 (BP Location: Right Arm, Patient Position: Sitting, Cuff Size: Normal)   Pulse 60   Ht 5' 5.5" (1.664 m)   Wt 156 lb 12.8 oz (71.1 kg)   LMP 02/20/1986 (Approximate)   BMI 25.70 kg/m     General appearance: alert, cooperative and appears stated age Head: Normocephalic, without obvious abnormality, atraumatic Neck: no adenopathy, supple, symmetrical, trachea midline and thyroid normal to inspection and palpation Lungs: clear to auscultation bilaterally Heart: regular rate and rhythm Abdomen: soft, non-tender, no masses,  no organomegaly Extremities: extremities normal, atraumatic, no cyanosis or edema Skin: Skin color, texture, turgor normal. No rashes or lesions No abnormal inguinal nodes palpated Neurologic: Grossly normal  Pelvic: External genitalia:  no lesions              Urethra:  normal appearing urethra with no masses, tenderness or lesions              Bartholins and Skenes: normal                 Vagina: normal appearing vagina with normal color and discharge, no lesions.  Second degree cystocele. Leaking urine with strain.  First to second degree vault prolapse, first degree rectocele.              Cervix:  Absent.                 Bimanual Exam:  Uterus:  Absent.               Adnexa: no mass, fullness, tenderness              Rectal exam: Yes.  .   Confirms.              Anus:  normal sphincter tone, no lesions  Chaperone was present for exam.  ASSESSMENT  Status post TAH for bleeding and endometriosis. Ovaries remain.  Genuine stress incontinence.  Vaginal vault prolapse following  hysterectomy with cystocele and rectocele.  PLAN  Extensive discussion regarding pelvic organ prolapse and surgical options for repair.  We discussed a robotic BSO with Dr. Hyacinth MeekerMiller and then me performing a robotic sacrocolpopexy with anterior and posterior colporrhaphy and TVT Exact midurethral sling/cystoscopy.  She again acknowledges that she understands a proctor will be there for the robotic sacrocolpopexy. Benefits and risks of surgery discussed and patient states she understands these risks. Risks of surgery include but are not limited to life threatening bleeding, infection, damage to surrounding organs, vaginal pain with sexual activity, permanent mesh use which may cause erosion and exposure in surrounding organs, slower voiding and urinary retention, de novo overactive bladder symptoms, reoperation, reaction to anesthesia, pneumonia, DVT, PE, death, and recurrence of prolapse and incontinence.   I have discussed surgical expectations regarding the procedures and success rates, outcomes, and recovery.     Patient wishes to proceed.  An After Visit Summary was printed and given to the patient.  __40____ minutes face to face time of which over 50% was spent in counseling.

## 2016-12-14 NOTE — Progress Notes (Signed)
66 y.o. [id number] MarriedCaucasianF here for annual exam.  Having robotic assisted sacro-colpopexy with Dr. Edward Jolly.  H/o prior hysterectomy.  Desirous of ovary removal.  Procedure has been reviewed with pt.  Risks including bleeding, infection, ureteral injury, vascular injury, DVT/PE, rare risk of death reviewed.  Pt really wants repair of prolapse and ovary removal done at same time.  This this is possible.    Has not had any night sweats since starting the Celexa.  Really pleased with results.  PCP:  Dr. Clarene Duke.  Has been followed her CBC due to elevated white count.  Everything was normal with last blood work in September.    Patient's last menstrual period was 02/20/1986 (approximate).          Sexually active: No.  The current method of family planning is status post hysterectomy.    Exercising: Yes.    gym - machines Smoker:  no  Health Maintenance: Pap:  12/03/06 Neg  History of abnormal Pap:  no MMG: 02/08/16 BIRADS1:neg  Colonoscopy:  12/2006 f/u 10 year.  Will plan this next year.  BMD: 12/21/14 osteopenia  TDaP: current with PCP Pneumonia vaccine(s):  2012 Zostavax:   Done with PCP Hep C testing: will do with pre-op blood work Screening Labs: PCP   reports that she has never smoked. She has never used smokeless tobacco. She reports that she drinks about 3.0 oz of alcohol per week . She reports that she does not use drugs.  Past Medical History:  Diagnosis Date  . Family history of adverse reaction to anesthesia    pt daughter very nauseous with anesthesia.  . H/O seasonal allergies    uses OTC med  . History of anemia    no problems now since Hysterectomy  . History of endometriosis   . Hypertension   . Migraine    Nadolol was use to help-no longer a problem.  . Osteopenia   . Recurrent sinus infections    last Spring 2017   . Vaginal prolapse    "wears pessary"    Past Surgical History:  Procedure Laterality Date  . LAPAROSCOPIC APPENDECTOMY N/A 12/10/2015   Procedure: APPENDECTOMY LAPAROSCOPIC;  Surgeon: Luretha Murphy, MD;  Location: [facility];  Service: General;  Laterality: N/A;  . NASAL SEPTUM SURGERY    . NASAL SINUS SURGERY  2002  . TOTAL ABDOMINAL HYSTERECTOMY  1998   DUB/endometriosis  . TOTAL KNEE ARTHROPLASTY Left 11/30/2015   Procedure: LEFT TOTAL KNEE ARTHROPLASTY;  Surgeon: Durene Romans, MD;  Location: [facility];  Service: Orthopedics;  Laterality: Left;  Adductor Block    Current Outpatient Prescriptions  Medication Sig Dispense Refill  . citalopram (CELEXA) 20 MG tablet Take 1 tablet (20 mg total) by mouth daily. 30 tablet 1  . clobetasol (TEMOVATE) 0.05 % external solution Apply 1 application topically 2 (two) times daily as needed (APPLY TO SCALP 1 TO 2 TIMES A DAY FOR FLARES).    Marland Kitchen conjugated estrogens (PREMARIN) vaginal cream 1/2 gm vaginally twice weekly only 30 g 0  . fluticasone (FLONASE) 50 MCG/ACT nasal spray Place 1-2 sprays into both nostrils daily as needed (for nasal congestion.).     Marland Kitchen gabapentin (NEURONTIN) 300 MG capsule Take 1 capsule (300 mg total) by mouth at bedtime. 90 capsule 1  . loratadine (CLARITIN) 10 MG tablet Take 10 mg by mouth daily.    . nadolol (CORGARD) 20 MG tablet TAKE 1 TABLET (20 MG TOTAL) BY MOUTH DAILY. (Patient taking differently: TAKE 1 TABLET (  20 MG TOTAL) BY MOUTH DAILY IN THE EVENING) 90 tablet 4  . SUMAtriptan (IMITREX) 100 MG tablet Take 1 tablet (100 mg total) by mouth every 2 (two) hours as needed for migraine. 9 tablet 4   No current facility-administered medications for this visit.     Family History  Problem Relation Age of Onset  . Hypertension Maternal Grandfather   . Heart attack Father   . Miscarriages / Stillbirths Sister   . Transient ischemic attack Maternal Grandmother   . Osteoarthritis Mother     ROS:  Pertinent items are noted in HPI.  Otherwise, a comprehensive ROS was negative.  Exam:   BP 100/60 (BP Location: Right Arm, Patient Position: Sitting, Cuff Size:  Normal)   Pulse 66   Resp 14   Ht 5\' 6"  (1.676 m)   Wt 155 lb (70.3 kg)   LMP 02/20/1986 (Approximate)   BMI 25.02 kg/m     Height: 5\' 6"  (167.6 cm)  Ht Readings from Last 3 Encounters:  12/15/16 5\' 6"  (1.676 m)  12/11/16 5' 5.5" (1.664 m)  11/14/16 5' 5.5" (1.664 m)    General appearance: alert, cooperative and appears stated age Head: Normocephalic, without obvious abnormality, atraumatic Neck: no adenopathy, supple, symmetrical, trachea midline and thyroid normal to inspection and palpation Lungs: clear to auscultation bilaterally Breasts: normal appearance, no masses or tenderness Heart: regular rate and rhythm Abdomen: soft, non-tender; bowel sounds normal; no masses,  no organomegaly Extremities: extremities normal, atraumatic, no cyanosis or edema Skin: Skin color, texture, turgor normal. No rashes or lesions Lymph nodes: Cervical, supraclavicular, and axillary nodes normal. No abnormal inguinal nodes palpated Neurologic: Grossly normal   Pelvic: External genitalia:  no lesions              Urethra:  normal appearing urethra with no masses, tenderness or lesions              Bartholins and Skenes: normal                 Vagina: normal appearing vagina with normal color and discharge, no lesions, 3rd degree cystocele with associated incomplete vault prolapse              Cervix: absent              Pap taken: No. Bimanual Exam:  Uterus:  uterus absent              Adnexa: no mass, fullness, tenderness               Rectovaginal: Confirms               Anus:  normal sphincter tone, no lesions  Chaperone was present for exam.  A:  Well Woman with normal exam PMP, no HRT H/O TAH, ovaries remain, due to endometriosis and DUB Cystocele, vault prolapse, rectocele SUI Vasomotor symptoms much improved with Gabapentin and Celexa  P:   Mammogram guideines reviewed.  Will do after planned surgery pap smear not indicated RF for Celexa 20mg  daily.  #90/4Rf RF for  Gabapentin 300mg  nightly  #90/4RF Will check Hep C antibody with pre-op labs Colononscopy due in November.  Pt planning to do in early 2019. Post op rx's for Percocet and Motrin given. Return annually or prn

## 2016-12-15 ENCOUNTER — Other Ambulatory Visit: Payer: Self-pay | Admitting: Obstetrics & Gynecology

## 2016-12-15 ENCOUNTER — Ambulatory Visit (INDEPENDENT_AMBULATORY_CARE_PROVIDER_SITE_OTHER): Payer: Medicare Other | Admitting: Obstetrics & Gynecology

## 2016-12-15 ENCOUNTER — Encounter: Payer: Self-pay | Admitting: Obstetrics & Gynecology

## 2016-12-15 VITALS — BP 100/60 | HR 66 | Resp 14 | Ht 66.0 in | Wt 155.0 lb

## 2016-12-15 DIAGNOSIS — N816 Rectocele: Secondary | ICD-10-CM | POA: Diagnosis not present

## 2016-12-15 DIAGNOSIS — N393 Stress incontinence (female) (male): Secondary | ICD-10-CM | POA: Diagnosis not present

## 2016-12-15 DIAGNOSIS — Z01419 Encounter for gynecological examination (general) (routine) without abnormal findings: Secondary | ICD-10-CM | POA: Diagnosis not present

## 2016-12-15 DIAGNOSIS — N993 Prolapse of vaginal vault after hysterectomy: Secondary | ICD-10-CM | POA: Diagnosis not present

## 2016-12-15 DIAGNOSIS — N8111 Cystocele, midline: Secondary | ICD-10-CM

## 2016-12-15 MED ORDER — IBUPROFEN 800 MG PO TABS: 800.0000 mg | ORAL_TABLET | Freq: Three times a day (TID) | ORAL | 0 refills | Status: DC | PRN

## 2016-12-15 MED ORDER — OXYCODONE-ACETAMINOPHEN 5-325 MG PO TABS: 2.0000 | ORAL_TABLET | ORAL | 0 refills | Status: DC | PRN

## 2016-12-15 MED ORDER — GABAPENTIN 300 MG PO CAPS: 300.0000 mg | ORAL_CAPSULE | Freq: Every day | ORAL | 4 refills | Status: DC

## 2016-12-15 MED ORDER — CITALOPRAM HYDROBROMIDE 20 MG PO TABS: 20.0000 mg | ORAL_TABLET | Freq: Every day | ORAL | 4 refills | Status: DC

## 2016-12-20 NOTE — Patient Instructions (Addendum)
Your procedure is scheduled on:  Monday, Nov. 12, 2018  [facility]:  6:00 AM  Pick up the phone at the desk and dial [phone].  Call this number if you have problems the morning of surgery: [phone].  Remember: Do NOT eat food or drink after:  Midnight Sunday  Take these medicines the morning of surgery with a SIP OF WATER:  Loratadine (Claritin)  Stop ALL herbal medications at this time  Do NOT smoke the day of surgery.  Do NOT wear jewelry (body piercing), metal hair clips/bobby pins, make-up, artifical eyelashes or nail polish. Do NOT wear lotions, powders, or perfumes.  You may wear deodorant. Do NOT shave for 48 hours prior to surgery. Do NOT bring valuables to the hospital. Contacts, dentures, or bridgework may not be worn into surgery.  Leave suitcase in car.  After surgery it may be brought to your room.  For patients admitted to the hospital, checkout time is 11:00 AM the day of discharge.  Have a responsible adult drive you home and stay with you for 24 hours after your procedure  Bring a copy of your healthcare power of attorney and living will documents.

## 2016-12-22 ENCOUNTER — Encounter (HOSPITAL_COMMUNITY): Payer: Self-pay

## 2016-12-22 ENCOUNTER — Other Ambulatory Visit: Payer: Self-pay

## 2016-12-22 ENCOUNTER — Encounter (HOSPITAL_COMMUNITY)
Admission: RE | Admit: 2016-12-22 | Discharge: 2016-12-22 | Disposition: A | Discharge status: Home / Self Care | Payer: Medicare Other | Source: Ambulatory Visit | Attending: Obstetrics and Gynecology | Admitting: Obstetrics and Gynecology

## 2016-12-22 DIAGNOSIS — N393 Stress incontinence (female) (male): Secondary | ICD-10-CM | POA: Insufficient documentation

## 2016-12-22 DIAGNOSIS — Z01812 Encounter for preprocedural laboratory examination: Secondary | ICD-10-CM | POA: Diagnosis present

## 2016-12-22 DIAGNOSIS — Z01810 Encounter for preprocedural cardiovascular examination: Secondary | ICD-10-CM | POA: Diagnosis present

## 2016-12-22 DIAGNOSIS — N993 Prolapse of vaginal vault after hysterectomy: Secondary | ICD-10-CM | POA: Diagnosis not present

## 2016-12-22 HISTORY — DX: Unspecified osteoarthritis, unspecified site: M19.90

## 2016-12-22 LAB — COMPREHENSIVE METABOLIC PANEL
ALT: 20 U/L (ref 14–54)
AST: 22 U/L (ref 15–41)
Albumin: 4 g/dL (ref 3.5–5.0)
Alkaline Phosphatase: 81 U/L (ref 38–126)
Anion gap: 10 (ref 5–15)
BUN: 21 mg/dL — ABNORMAL HIGH (ref 6–20)
CHLORIDE: 97 mmol/L — AB (ref 101–111)
CO2: 27 mmol/L (ref 22–32)
CREATININE: 0.87 mg/dL (ref 0.44–1.00)
Calcium: 8.7 mg/dL — ABNORMAL LOW (ref 8.9–10.3)
GFR calc non Af Amer: >60 mL/min (ref >60)
Glucose, Bld: 100 mg/dL — ABNORMAL HIGH (ref 65–99)
POTASSIUM: 3.8 mmol/L (ref 3.5–5.1)
SODIUM: 134 mmol/L — AB (ref 135–145)
Total Bilirubin: 0.6 mg/dL (ref 0.3–1.2)
Total Protein: 7.7 g/dL (ref 6.5–8.1)

## 2016-12-22 LAB — CBC
HCT: 40.7 % (ref 36.0–46.0)
Hemoglobin: 13.5 g/dL (ref 12.0–15.0)
MCH: 31.3 pg (ref 26.0–34.0)
MCHC: 33.2 g/dL (ref 30.0–36.0)
MCV: 94.2 fL (ref 78.0–100.0)
PLATELETS: 283 10*3/uL (ref 150–400)
RBC: 4.32 MIL/uL (ref 3.87–5.11)
RDW: 14.5 % (ref 11.5–15.5)
WBC: 9 10*3/uL (ref 4.0–10.5)

## 2016-12-22 LAB — TYPE AND SCREEN
ABO/RH(D): O POS
ANTIBODY SCREEN: NEGATIVE

## 2016-12-22 LAB — ABO/RH: ABO/RH(D): O POS

## 2016-12-22 NOTE — Pre-Procedure Instructions (Signed)
CMP results 12/22/16 faxed to Dr. Ardell IsaacsAmundson C. Silva via epic.

## 2016-12-23 LAB — HEPATITIS C ANTIBODY: HCV Ab: 0.1 s/co ratio (ref 0.0–0.9)

## 2016-12-25 ENCOUNTER — Telehealth: Payer: Self-pay

## 2016-12-25 NOTE — Telephone Encounter (Signed)
Spoke with patient. Advised of results as seen below from Dr.Silva. Patient verbalizes understanding.  Will close encounter.

## 2016-12-25 NOTE — Telephone Encounter (Signed)
-----   Message from Patton SallesBrook E Amundson C Silva, MD sent at 12/22/2016  2:15 PM EDT ----- Please let patient know that I have reviewed her preop EKG and labs.  Her EKG is normal.  Her blood counts are normal. Her blood chemistries are showing some minor alterations - sodium is a point below normal, chloride is 4 points below normal, glucose is a point above normal, and calcium is 0.2 points below normal.  None of this will impact her surgery.  She is Ok to proceed!  Cc- Dr. Hyacinth MeekerMiller

## 2016-12-31 NOTE — Anesthesia Preprocedure Evaluation (Addendum)
Anesthesia Evaluation  Patient identified by MRN, date of birth, ID band Patient awake    Reviewed: Allergy & Precautions, NPO status , Patient's Chart, lab work & pertinent test results  History of Anesthesia Complications Negative for: history of anesthetic complications  Airway Mallampati: II  TM Distance: >3 FB Neck ROM: Full    Dental  (+) Dental Advisory Given   Pulmonary neg pulmonary ROS,    breath sounds clear to auscultation       Cardiovascular negative cardio ROS   Rhythm:Regular Rate:Normal     Neuro/Psych  Headaches,    GI/Hepatic negative GI ROS, Neg liver ROS,   Endo/Other  negative endocrine ROS  Renal/GU negative Renal ROS     Musculoskeletal  (+) Arthritis ,   Abdominal   Peds  Hematology negative hematology ROS (+)   Anesthesia Other Findings   Reproductive/Obstetrics                            Anesthesia Physical Anesthesia Plan  ASA: II  Anesthesia Plan: General   Post-op Pain Management:    Induction: Intravenous  PONV Risk Score and Plan: 4 or greater and Scopolamine patch - Pre-op, Dexamethasone and Ondansetron  Airway Management Planned: Oral ETT  Additional Equipment:   Intra-op Plan:   Post-operative Plan: Extubation in OR  Informed Consent: I have reviewed the patients History and Physical, chart, labs and discussed the procedure including the risks, benefits and alternatives for the proposed anesthesia with the patient or authorized representative who has indicated his/her understanding and acceptance.   Dental advisory given  Plan Discussed with: CRNA and Surgeon  Anesthesia Plan Comments: (Plan routine monitors, GETA)        Anesthesia Quick Evaluation

## 2016-12-31 NOTE — H&P (Signed)
Office Visit   12/11/2016 [facility]    Ardell Isaacs, Forrestine Him, MD  Obstetrics and Gynecology   Female bladder prolapse +2 more  Dx   Surgical Consult   ; Referred by [name], MD  Reason for Visit   Additional Documentation   Vitals:   BP 120/78 (BP Location: Right Arm, Patient Position: Sitting, Cuff Size: Normal)   Pulse 60   Ht 5' 5.5" (1.664 m)   Wt 156 lb 12.8 oz (71.1 kg)   LMP 02/20/1986 (Approximate)   BMI 25.70 kg/m   BSA 1.81 m      More Vitals   Flowsheets:   Infectious Disease Screening,   MEWS Score,   Anthropometrics     Encounter Info:   Billing Info,   History,   Allergies,   Detailed Report     All Notes   Progress Notes by Patton Salles, MD at 12/11/2016 1:00 PM   Author: Patton Salles, MD Author Type: Physician Filed: 12/13/2016 6:40 AM  Note Status: Signed Cosign: Cosign Not Required Encounter Date: 12/11/2016  Editor: Patton Salles, MD (Physician)  Prior Versions: 1. Alphonsa Overall, CMA (Certified Medical Assistant) at 12/11/2016 1:09 PM - Sign at close encounter    GYNECOLOGY  VISIT   HPI: 66 y.o.   Married  Caucasian  female   G2P2002 with Patient's last menstrual period was 02/20/1986 (approximate).   here for sugical consult.    Husband is present for the entire visit today.  Urinary incontinence with cough and sneeze. No constipation or fecal soiling.  Not very sexually active.   Urodynamic testing confirms stress incontinence. VLPP 60 cm H2O at 498 cc.  UPP 22 cm H20 at 503 cc.  Declines to continue with a pessary incontinence dish for her cystocele and stress incontinence.  Now taking Celexa for hot flashes.  Still taking Neurontin as well.   No other changes in her health.  States she does not have a hx of HTN.  States she can use Lidocaine.   Had her flu vaccine.   GYNECOLOGIC HISTORY: Patient's last menstrual period was 02/20/1986  (approximate). Contraception:  Hysterectomy Menopausal hormone therapy:  none Last mammogram: 02-08-16 Density C/Neg/BiRads1:TBC Last pap smear: 12-03-06 Neg                OB History    Gravida Para Term Preterm AB Living   2 2 2  0 0 2   SAB TAB Ectopic Multiple Live Births   0 0 0 0 2             Patient Active Problem List   Diagnosis Date Noted  . S/P laparoscopic appendectomyOct 2017 12/10/2015  . Acute appendicitis 12/10/2015  . Overweight (BMI 25.0-29.9) 12/01/2015  . S/P left TKA 11/30/2015  . Migraine, unspecified, without mention of intractable migraine without mention of status migrainosus 09/02/2013  . History of endometriosis 09/02/2013        Past Medical History:  Diagnosis Date  . Family history of adverse reaction to anesthesia    pt daughter very nauseous with anesthesia.  . H/O seasonal allergies    uses OTC med  . History of anemia    no problems now since Hysterectomy  . History of endometriosis   . Hypertension   . Migraine    Nadolol was use to help-no longer a problem.  . Osteopenia   . Recurrent sinus infections  last Spring 2017   . Vaginal prolapse    "wears pessary"         Past Surgical History:  Procedure Laterality Date  . LAPAROSCOPIC APPENDECTOMY N/A 12/10/2015   Procedure: APPENDECTOMY LAPAROSCOPIC;  Surgeon: Luretha MurphyMatthew Martin, MD;  Location: [facility];  Service: General;  Laterality: N/A;  . NASAL SEPTUM SURGERY    . NASAL SINUS SURGERY  2002  . TOTAL ABDOMINAL HYSTERECTOMY  1998   DUB/endometriosis  . TOTAL KNEE ARTHROPLASTY Left 11/30/2015   Procedure: LEFT TOTAL KNEE ARTHROPLASTY;  Surgeon: Durene RomansMatthew Olin, MD;  Location: [facility];  Service: Orthopedics;  Laterality: Left;  Adductor Block          Current Outpatient Prescriptions  Medication Sig Dispense Refill  . citalopram (CELEXA) 20 MG tablet Take 1 tablet (20 mg total) by mouth daily. 30 tablet 1  . clobetasol (TEMOVATE) 0.05 %  external solution Apply 1 application topically 2 (two) times daily as needed (APPLY TO SCALP 1 TO 2 TIMES A DAY FOR FLARES).    Marland Kitchen. conjugated estrogens (PREMARIN) vaginal cream 1/2 gm vaginally twice weekly only 30 g 0  . fluticasone (FLONASE) 50 MCG/ACT nasal spray Place 1-2 sprays into both nostrils daily as needed (for nasal congestion.).     Marland Kitchen. gabapentin (NEURONTIN) 300 MG capsule Take 1 capsule (300 mg total) by mouth at bedtime. 90 capsule 1  . loratadine (CLARITIN) 10 MG tablet Take 10 mg by mouth daily.    . nadolol (CORGARD) 20 MG tablet TAKE 1 TABLET (20 MG TOTAL) BY MOUTH DAILY. (Patient taking differently: TAKE 1 TABLET (20 MG TOTAL) BY MOUTH DAILY IN THE EVENING) 90 tablet 4  . SUMAtriptan (IMITREX) 100 MG tablet Take 1 tablet (100 mg total) by mouth every 2 (two) hours as needed for migraine. 9 tablet 4   No current facility-administered medications for this visit.      ALLERGIES: Novocain [procaine]       Family History  Problem Relation Age of Onset  . Hypertension Maternal Grandfather   . Heart attack Father   . Miscarriages / Stillbirths Sister   . Transient ischemic attack Maternal Grandmother   . Osteoarthritis Mother     Social History        Social History  . Marital status: Married    Spouse name: N/A  . Number of children: N/A  . Years of education: N/A      Occupational History  . Not on file.         Social History Main Topics  . Smoking status: Never Smoker  . Smokeless tobacco: Never Used  . Alcohol use 3.0 oz/week    5 Standard drinks or equivalent per week     Comment: wine  . Drug use: No  . Sexual activity: Yes    Partners: Male    Birth control/ protection: Surgical     Comment: TAH       Other Topics Concern  . Not on file      Social History Narrative  . No narrative on file    ROS:  Pertinent items are noted in HPI.  PHYSICAL EXAMINATION:    BP 120/78 (BP Location: Right Arm,  Patient Position: Sitting, Cuff Size: Normal)   Pulse 60   Ht 5' 5.5" (1.664 m)   Wt 156 lb 12.8 oz (71.1 kg)   LMP 02/20/1986 (Approximate)   BMI 25.70 kg/m     General appearance: alert, cooperative and appears stated age Head: Normocephalic,  without obvious abnormality, atraumatic Neck: no adenopathy, supple, symmetrical, trachea midline and thyroid normal to inspection and palpation Lungs: clear to auscultation bilaterally Heart: regular rate and rhythm Abdomen: soft, non-tender, no masses,  no organomegaly Extremities: extremities normal, atraumatic, no cyanosis or edema Skin: Skin color, texture, turgor normal. No rashes or lesions No abnormal inguinal nodes palpated Neurologic: Grossly normal  Pelvic: External genitalia:  no lesions              Urethra:  normal appearing urethra with no masses, tenderness or lesions              Bartholins and Skenes: normal                 Vagina: normal appearing vagina with normal color and discharge, no lesions.  Second degree cystocele. Leaking urine with strain.  First to second degree vault prolapse, first degree rectocele.              Cervix:  Absent.                 Bimanual Exam:  Uterus:  Absent.               Adnexa: no mass, fullness, tenderness              Rectal exam: Yes.  .  Confirms.              Anus:  normal sphincter tone, no lesions  Chaperone was present for exam.  ASSESSMENT  Status post TAH for bleeding and endometriosis. Ovaries remain.  Genuine stress incontinence.  Vaginal vault prolapse following hysterectomy with cystocele and rectocele.  PLAN  Extensive discussion regarding pelvic organ prolapse and surgical options for repair.  We discussed a robotic BSO with Dr. Hyacinth MeekerMiller and then me performing a robotic sacrocolpopexy with anterior and posterior colporrhaphy and TVT Exact midurethral sling/cystoscopy.  She again acknowledges that she understands a proctor will be there for the robotic  sacrocolpopexy. Benefits and risks of surgery discussed and patient states she understands these risks. Risks of surgery include but are not limited to life threatening bleeding, infection, damage to surrounding organs, vaginal pain with sexual activity, permanent mesh use which may cause erosion and exposure in surrounding organs, slower voiding and urinary retention, de novo overactive bladder symptoms, reoperation, reaction to anesthesia, pneumonia, DVT, PE, death, and recurrence of prolapse and incontinence.   I have discussed surgical expectations regarding the procedures and success rates, outcomes, and recovery.     Patient wishes to proceed.  An After Visit Summary was printed and given to the patient.  __40____ minutes face to face time of which over 50% was spent in counseling.

## 2017-01-01 ENCOUNTER — Encounter (HOSPITAL_COMMUNITY)
Admission: AD | Disposition: A | Discharge status: Home / Self Care | Payer: Self-pay | Source: Ambulatory Visit | Attending: Obstetrics and Gynecology

## 2017-01-01 ENCOUNTER — Other Ambulatory Visit: Payer: Self-pay

## 2017-01-01 ENCOUNTER — Ambulatory Visit (HOSPITAL_COMMUNITY): Payer: Medicare Other | Admitting: Anesthesiology

## 2017-01-01 ENCOUNTER — Encounter (HOSPITAL_COMMUNITY): Payer: Self-pay

## 2017-01-01 ENCOUNTER — Observation Stay (HOSPITAL_COMMUNITY)
Admission: AD | Admit: 2017-01-01 | Discharge: 2017-01-02 | Disposition: A | Discharge status: Home / Self Care | Payer: Medicare Other | Source: Ambulatory Visit | Attending: Obstetrics and Gynecology | Admitting: Obstetrics and Gynecology

## 2017-01-01 DIAGNOSIS — J302 Other seasonal allergic rhinitis: Secondary | ICD-10-CM | POA: Insufficient documentation

## 2017-01-01 DIAGNOSIS — Z7951 Long term (current) use of inhaled steroids: Secondary | ICD-10-CM | POA: Insufficient documentation

## 2017-01-01 DIAGNOSIS — N838 Other noninflammatory disorders of ovary, fallopian tube and broad ligament: Secondary | ICD-10-CM | POA: Insufficient documentation

## 2017-01-01 DIAGNOSIS — Z96652 Presence of left artificial knee joint: Secondary | ICD-10-CM | POA: Insufficient documentation

## 2017-01-01 DIAGNOSIS — N993 Prolapse of vaginal vault after hysterectomy: Secondary | ICD-10-CM | POA: Diagnosis not present

## 2017-01-01 DIAGNOSIS — N736 Female pelvic peritoneal adhesions (postinfective): Secondary | ICD-10-CM | POA: Insufficient documentation

## 2017-01-01 DIAGNOSIS — I10 Essential (primary) hypertension: Secondary | ICD-10-CM | POA: Insufficient documentation

## 2017-01-01 DIAGNOSIS — E663 Overweight: Secondary | ICD-10-CM | POA: Diagnosis not present

## 2017-01-01 DIAGNOSIS — Z79899 Other long term (current) drug therapy: Secondary | ICD-10-CM | POA: Insufficient documentation

## 2017-01-01 DIAGNOSIS — Z9889 Other specified postprocedural states: Secondary | ICD-10-CM

## 2017-01-01 DIAGNOSIS — R339 Retention of urine, unspecified: Secondary | ICD-10-CM | POA: Insufficient documentation

## 2017-01-01 DIAGNOSIS — N816 Rectocele: Secondary | ICD-10-CM | POA: Diagnosis not present

## 2017-01-01 DIAGNOSIS — Z4002 Encounter for prophylactic removal of ovary: Secondary | ICD-10-CM | POA: Insufficient documentation

## 2017-01-01 DIAGNOSIS — N809 Endometriosis, unspecified: Secondary | ICD-10-CM | POA: Diagnosis not present

## 2017-01-01 DIAGNOSIS — Z6825 Body mass index (BMI) 25.0-25.9, adult: Secondary | ICD-10-CM | POA: Insufficient documentation

## 2017-01-01 DIAGNOSIS — N393 Stress incontinence (female) (male): Secondary | ICD-10-CM | POA: Diagnosis not present

## 2017-01-01 DIAGNOSIS — Z90710 Acquired absence of both cervix and uterus: Secondary | ICD-10-CM | POA: Diagnosis not present

## 2017-01-01 DIAGNOSIS — N8111 Cystocele, midline: Secondary | ICD-10-CM | POA: Diagnosis not present

## 2017-01-01 HISTORY — PX: BLADDER SUSPENSION: SHX72

## 2017-01-01 HISTORY — PX: CYSTOSCOPY: SHX5120

## 2017-01-01 HISTORY — PX: ANTERIOR AND POSTERIOR REPAIR: SHX5121

## 2017-01-01 HISTORY — PX: ROBOTIC ASSISTED LAPAROSCOPIC SACROCOLPOPEXY: SHX5388

## 2017-01-01 HISTORY — PX: ROBOTIC ASSISTED BILATERAL SALPINGO OOPHERECTOMY: SHX6078

## 2017-01-01 SURGERY — ROBOTIC ASSISTED LAPAROSCOPIC SACROCOLPOPEXY
Anesthesia: General | Site: Vagina

## 2017-01-01 MED ORDER — ONDANSETRON HCL 4 MG PO TABS: 4.0000 mg | ORAL_TABLET | Freq: Four times a day (QID) | ORAL | Status: DC | PRN

## 2017-01-01 MED ORDER — LIDOCAINE HCL (CARDIAC) 20 MG/ML IV SOLN
INTRAVENOUS | Status: DC | PRN
  Administered 2017-01-01: 40 mg via INTRAVENOUS

## 2017-01-01 MED ORDER — STERILE WATER FOR IRRIGATION IR SOLN
Status: DC | PRN
  Administered 2017-01-01: 1000 mL via INTRAVESICAL

## 2017-01-01 MED ORDER — KETOROLAC TROMETHAMINE 15 MG/ML IJ SOLN
15.0000 mg | Freq: Four times a day (QID) | INTRAMUSCULAR | Status: DC
  Administered 2017-01-01 – 2017-01-02 (×3): 15 mg via INTRAVENOUS
  Filled 2017-01-01 (×5): qty 1

## 2017-01-01 MED ORDER — MIDAZOLAM HCL 2 MG/2ML IJ SOLN: 0.5000 mg | Freq: Once | INTRAMUSCULAR | Status: DC | PRN

## 2017-01-01 MED ORDER — FENTANYL CITRATE (PF) 250 MCG/5ML IJ SOLN
INTRAMUSCULAR | Status: AC
  Filled 2017-01-01: qty 5

## 2017-01-01 MED ORDER — HYDROMORPHONE HCL 1 MG/ML IJ SOLN
INTRAMUSCULAR | Status: AC
  Filled 2017-01-01: qty 1

## 2017-01-01 MED ORDER — OXYCODONE-ACETAMINOPHEN 5-325 MG PO TABS
1.0000 | ORAL_TABLET | ORAL | Status: DC | PRN
  Administered 2017-01-02: 1 via ORAL
  Filled 2017-01-01: qty 1

## 2017-01-01 MED ORDER — SODIUM CHLORIDE 0.9 % IJ SOLN
INTRAMUSCULAR | Status: AC
  Filled 2017-01-01: qty 50

## 2017-01-01 MED ORDER — LIDOCAINE-EPINEPHRINE 1 %-1:100000 IJ SOLN
INTRAMUSCULAR | Status: DC | PRN
  Administered 2017-01-01: 10 mL

## 2017-01-01 MED ORDER — SCOPOLAMINE 1 MG/3DAYS TD PT72
MEDICATED_PATCH | TRANSDERMAL | Status: DC | PRN
  Administered 2017-01-01: 1 via TRANSDERMAL

## 2017-01-01 MED ORDER — FENTANYL CITRATE (PF) 100 MCG/2ML IJ SOLN
INTRAMUSCULAR | Status: DC | PRN
  Administered 2017-01-01 (×2): 50 ug via INTRAVENOUS
  Administered 2017-01-01: 100 ug via INTRAVENOUS

## 2017-01-01 MED ORDER — LACTATED RINGERS IV SOLN
INTRAVENOUS | Status: DC
  Administered 2017-01-01 – 2017-01-02 (×2): via INTRAVENOUS

## 2017-01-01 MED ORDER — LORATADINE 10 MG PO TABS
10.0000 mg | ORAL_TABLET | Freq: Every day | ORAL | Status: DC
  Administered 2017-01-02: 10 mg via ORAL
  Filled 2017-01-01 (×2): qty 1

## 2017-01-01 MED ORDER — GLYCOPYRROLATE 0.2 MG/ML IJ SOLN
INTRAMUSCULAR | Status: AC
  Filled 2017-01-01: qty 1

## 2017-01-01 MED ORDER — IBUPROFEN 600 MG PO TABS
600.0000 mg | ORAL_TABLET | Freq: Four times a day (QID) | ORAL | Status: DC | PRN
  Administered 2017-01-02: 600 mg via ORAL
  Filled 2017-01-01: qty 1

## 2017-01-01 MED ORDER — PROPOFOL 10 MG/ML IV BOLUS
INTRAVENOUS | Status: AC
  Filled 2017-01-01: qty 20

## 2017-01-01 MED ORDER — EPHEDRINE 5 MG/ML INJ
INTRAVENOUS | Status: AC
  Filled 2017-01-01: qty 10

## 2017-01-01 MED ORDER — MIDAZOLAM HCL 2 MG/2ML IJ SOLN
INTRAMUSCULAR | Status: AC
  Filled 2017-01-01: qty 2

## 2017-01-01 MED ORDER — SUMATRIPTAN SUCCINATE 100 MG PO TABS
100.0000 mg | ORAL_TABLET | ORAL | Status: DC | PRN
  Filled 2017-01-01: qty 1

## 2017-01-01 MED ORDER — BUPIVACAINE HCL (PF) 0.25 % IJ SOLN
INTRAMUSCULAR | Status: DC | PRN
  Administered 2017-01-01: 12 mL

## 2017-01-01 MED ORDER — EPHEDRINE SULFATE-NACL 50-0.9 MG/10ML-% IV SOSY
PREFILLED_SYRINGE | INTRAVENOUS | Status: DC | PRN
  Administered 2017-01-01 (×3): 5 mg via INTRAVENOUS

## 2017-01-01 MED ORDER — DEXTROSE 5 % IV SOLN
2.0000 g | Freq: Once | INTRAVENOUS | Status: DC
  Filled 2017-01-01: qty 2

## 2017-01-01 MED ORDER — LIDOCAINE-EPINEPHRINE 1 %-1:100000 IJ SOLN
INTRAMUSCULAR | Status: AC
  Filled 2017-01-01: qty 1

## 2017-01-01 MED ORDER — DEXAMETHASONE SODIUM PHOSPHATE 10 MG/ML IJ SOLN
INTRAMUSCULAR | Status: DC | PRN
  Administered 2017-01-01: 10 mg via INTRAVENOUS

## 2017-01-01 MED ORDER — MENTHOL 3 MG MT LOZG: 1.0000 | LOZENGE | OROMUCOSAL | Status: DC | PRN

## 2017-01-01 MED ORDER — SCOPOLAMINE 1 MG/3DAYS TD PT72
MEDICATED_PATCH | TRANSDERMAL | Status: AC
  Filled 2017-01-01: qty 1

## 2017-01-01 MED ORDER — GABAPENTIN 300 MG PO CAPS
300.0000 mg | ORAL_CAPSULE | Freq: Every day | ORAL | Status: DC
  Administered 2017-01-01: 300 mg via ORAL
  Filled 2017-01-01 (×2): qty 1

## 2017-01-01 MED ORDER — DEXAMETHASONE SODIUM PHOSPHATE 4 MG/ML IJ SOLN
INTRAMUSCULAR | Status: AC
  Filled 2017-01-01: qty 1

## 2017-01-01 MED ORDER — MORPHINE SULFATE (PF) 4 MG/ML IV SOLN: 2.0000 mg | INTRAVENOUS | Status: DC | PRN

## 2017-01-01 MED ORDER — CEFOTETAN DISODIUM-DEXTROSE 2-2.08 GM-%(50ML) IV SOLR
2.0000 g | INTRAVENOUS | Status: AC
  Administered 2017-01-01 (×2): 2 g via INTRAVENOUS

## 2017-01-01 MED ORDER — LIDOCAINE HCL (CARDIAC) 20 MG/ML IV SOLN
INTRAVENOUS | Status: AC
  Filled 2017-01-01: qty 5

## 2017-01-01 MED ORDER — ONDANSETRON HCL 4 MG/2ML IJ SOLN: 4.0000 mg | Freq: Four times a day (QID) | INTRAMUSCULAR | Status: DC | PRN

## 2017-01-01 MED ORDER — ONDANSETRON HCL 4 MG/2ML IJ SOLN
INTRAMUSCULAR | Status: DC | PRN
  Administered 2017-01-01: 4 mg via INTRAVENOUS

## 2017-01-01 MED ORDER — MIDAZOLAM HCL 2 MG/2ML IJ SOLN
INTRAMUSCULAR | Status: DC | PRN
  Administered 2017-01-01: 2 mg via INTRAVENOUS

## 2017-01-01 MED ORDER — BUPIVACAINE HCL (PF) 0.25 % IJ SOLN
INTRAMUSCULAR | Status: AC
  Filled 2017-01-01: qty 30

## 2017-01-01 MED ORDER — GLYCOPYRROLATE 0.2 MG/ML IJ SOLN
INTRAMUSCULAR | Status: DC | PRN
  Administered 2017-01-01: 0.2 mg via INTRAVENOUS

## 2017-01-01 MED ORDER — DEXAMETHASONE SODIUM PHOSPHATE 10 MG/ML IJ SOLN
INTRAMUSCULAR | Status: AC
  Filled 2017-01-01: qty 1

## 2017-01-01 MED ORDER — SODIUM CHLORIDE 0.9 % IJ SOLN
INTRAMUSCULAR | Status: DC | PRN
  Administered 2017-01-01: 10 mL

## 2017-01-01 MED ORDER — LIDOCAINE HCL 1 % IJ SOLN
INTRAMUSCULAR | Status: AC
  Filled 2017-01-01: qty 20

## 2017-01-01 MED ORDER — MEPERIDINE HCL 25 MG/ML IJ SOLN: 6.2500 mg | INTRAMUSCULAR | Status: DC | PRN

## 2017-01-01 MED ORDER — ONDANSETRON HCL 4 MG/2ML IJ SOLN
INTRAMUSCULAR | Status: AC
  Filled 2017-01-01: qty 2

## 2017-01-01 MED ORDER — ESTRADIOL 0.1 MG/GM VA CREA
TOPICAL_CREAM | VAGINAL | Status: AC
  Filled 2017-01-01: qty 42.5

## 2017-01-01 MED ORDER — PROPOFOL 10 MG/ML IV BOLUS
INTRAVENOUS | Status: DC | PRN
  Administered 2017-01-01: 150 mg via INTRAVENOUS

## 2017-01-01 MED ORDER — ROPIVACAINE HCL 5 MG/ML IJ SOLN
INTRAMUSCULAR | Status: AC
  Filled 2017-01-01: qty 30

## 2017-01-01 MED ORDER — HYDROMORPHONE HCL 1 MG/ML IJ SOLN
0.2500 mg | INTRAMUSCULAR | Status: DC | PRN
  Administered 2017-01-01: 0.5 mg via INTRAVENOUS
  Administered 2017-01-01: 0.25 mg via INTRAVENOUS

## 2017-01-01 MED ORDER — SUGAMMADEX SODIUM 200 MG/2ML IV SOLN
INTRAVENOUS | Status: AC
  Filled 2017-01-01: qty 2

## 2017-01-01 MED ORDER — SUGAMMADEX SODIUM 500 MG/5ML IV SOLN
INTRAVENOUS | Status: AC
  Filled 2017-01-01: qty 5

## 2017-01-01 MED ORDER — CEFOTETAN DISODIUM-DEXTROSE 2-2.08 GM-%(50ML) IV SOLR
INTRAVENOUS | Status: AC
  Filled 2017-01-01: qty 50

## 2017-01-01 MED ORDER — NADOLOL 40 MG PO TABS
20.0000 mg | ORAL_TABLET | Freq: Every day | ORAL | Status: DC
  Administered 2017-01-01: 20 mg via ORAL
  Filled 2017-01-01 (×2): qty 1

## 2017-01-01 MED ORDER — SUGAMMADEX SODIUM 200 MG/2ML IV SOLN
INTRAVENOUS | Status: DC | PRN
  Administered 2017-01-01: 141.6 mg via INTRAVENOUS

## 2017-01-01 MED ORDER — LACTATED RINGERS IV SOLN
INTRAVENOUS | Status: DC
  Administered 2017-01-01: 125 mL/h via INTRAVENOUS
  Administered 2017-01-01: 14:00:00 via INTRAVENOUS
  Administered 2017-01-01: 1000 mL via INTRAVENOUS

## 2017-01-01 MED ORDER — SODIUM CHLORIDE 0.9 % IR SOLN
Status: DC | PRN
  Administered 2017-01-01: 3000 mL

## 2017-01-01 MED ORDER — PROMETHAZINE HCL 25 MG/ML IJ SOLN: 6.2500 mg | INTRAMUSCULAR | Status: DC | PRN

## 2017-01-01 MED ORDER — ROCURONIUM BROMIDE 100 MG/10ML IV SOLN
INTRAVENOUS | Status: DC | PRN
  Administered 2017-01-01: 45 mg via INTRAVENOUS
  Administered 2017-01-01: 10 mg via INTRAVENOUS
  Administered 2017-01-01: 5 mg via INTRAVENOUS
  Administered 2017-01-01 (×6): 10 mg via INTRAVENOUS
  Administered 2017-01-01: 20 mg via INTRAVENOUS

## 2017-01-01 MED ORDER — CITALOPRAM HYDROBROMIDE 20 MG PO TABS
20.0000 mg | ORAL_TABLET | Freq: Every evening | ORAL | Status: DC
Start: 2017-01-01 — End: 2017-01-02
  Administered 2017-01-01: 20 mg via ORAL
  Filled 2017-01-01 (×2): qty 1

## 2017-01-01 MED ORDER — FLUTICASONE PROPIONATE 50 MCG/ACT NA SUSP
1.0000 | Freq: Every day | NASAL | Status: DC | PRN
  Filled 2017-01-01: qty 16

## 2017-01-01 SURGICAL SUPPLY — 108 items
ADH SKN CLS APL DERMABOND .7 (GAUZE/BANDAGES/DRESSINGS) ×5
AGENT HMST KT MTR STRL THRMB (HEMOSTASIS)
BARRIER ADHS 3X4 INTERCEED (GAUZE/BANDAGES/DRESSINGS) IMPLANT
BLADE SURG 11 STRL SS (BLADE) ×7 IMPLANT
BLADE SURG 15 STRL LF C SS BP (BLADE) ×5 IMPLANT
BLADE SURG 15 STRL SS (BLADE) ×7
BRR ADH 4X3 ABS CNTRL BYND (GAUZE/BANDAGES/DRESSINGS)
CANISTER SUCT 3000ML PPV (MISCELLANEOUS) ×7 IMPLANT
CATH FOLEY 2WAY SLVR  5CC 18FR (CATHETERS)
CATH FOLEY 2WAY SLVR 5CC 18FR (CATHETERS) ×5 IMPLANT
CATH FOLEY 3WAY  5CC 16FR (CATHETERS) ×2
CATH FOLEY 3WAY 5CC 16FR (CATHETERS) ×5 IMPLANT
CATH FOLEY LATEX FREE 14FR (CATHETERS) ×7
CATH FOLEY LF 14FR (CATHETERS) IMPLANT
CLOTH BEACON ORANGE TIMEOUT ST (SAFETY) ×7 IMPLANT
CONT PATH 16OZ SNAP LID 3702 (MISCELLANEOUS) ×2 IMPLANT
CORD BIPOLAR FORCEPS 12FT (ELECTRODE) ×2 IMPLANT
COVER TIP SHEARS 8 DVNC (MISCELLANEOUS) ×5 IMPLANT
COVER TIP SHEARS 8MM DA VINCI (MISCELLANEOUS) ×2
DECANTER SPIKE VIAL GLASS SM (MISCELLANEOUS) ×14 IMPLANT
DEFOGGER SCOPE WARMER CLEARIFY (MISCELLANEOUS) ×7 IMPLANT
DERMABOND ADVANCED (GAUZE/BANDAGES/DRESSINGS) ×2
DERMABOND ADVANCED .7 DNX12 (GAUZE/BANDAGES/DRESSINGS) ×5 IMPLANT
DEVICE CAPIO SLIM SINGLE (INSTRUMENTS) IMPLANT
DISSECTOR BLUNT TIP ENDO 5MM (MISCELLANEOUS) ×2 IMPLANT
DISSECTOR SPONGE CHERRY (GAUZE/BANDAGES/DRESSINGS) ×7 IMPLANT
DRAPE UNDERBUTTOCKS STRL (DRAPE) ×5 IMPLANT
DRAPE WARM FLUID 44X44 (DRAPE) ×5 IMPLANT
DRSG OPSITE POSTOP 3X4 (GAUZE/BANDAGES/DRESSINGS) ×7 IMPLANT
DRSG OPSITE POSTOP 4X10 (GAUZE/BANDAGES/DRESSINGS) ×7 IMPLANT
DURAPREP 26ML APPLICATOR (WOUND CARE) ×7 IMPLANT
ELECT BLADE 6.5 EXT (BLADE) ×7 IMPLANT
FILTER STRAW FLUID ASPIR (MISCELLANEOUS) IMPLANT
GAUZE PACKING 2X5 YD STRL (GAUZE/BANDAGES/DRESSINGS) ×7 IMPLANT
GAUZE SPONGE 4X4 16PLY XRAY LF (GAUZE/BANDAGES/DRESSINGS) ×9 IMPLANT
GLOVE BIO SURGEON STRL SZ 6.5 (GLOVE) ×18 IMPLANT
GLOVE BIO SURGEONS STRL SZ 6.5 (GLOVE) ×3
GLOVE BIOGEL PI IND STRL 6.5 (GLOVE) ×5 IMPLANT
GLOVE BIOGEL PI IND STRL 7.0 (GLOVE) ×25 IMPLANT
GLOVE BIOGEL PI INDICATOR 6.5 (GLOVE) ×2
GLOVE BIOGEL PI INDICATOR 7.0 (GLOVE) ×10
GOWN STRL REUS W/TWL LRG LVL3 (GOWN DISPOSABLE) ×28 IMPLANT
HEMOSTAT ARISTA ABSORB 3G PWDR (MISCELLANEOUS) IMPLANT
KIT ACCESSORY DA VINCI DISP (KITS) ×2
KIT ACCESSORY DVNC DISP (KITS) ×5 IMPLANT
LEGGING LITHOTOMY PAIR STRL (DRAPES) ×7 IMPLANT
MESH RESTORELLE Y CONTOUR (Mesh General) ×2 IMPLANT
NDL MAYO 6 CRC TAPER PT (NEEDLE) IMPLANT
NEEDLE HYPO 22GX1.5 SAFETY (NEEDLE) ×7 IMPLANT
NEEDLE MAYO 6 CRC TAPER PT (NEEDLE) IMPLANT
NS IRRIG 1000ML POUR BTL (IV SOLUTION) ×7 IMPLANT
OCCLUDER COLPOPNEUMO (BALLOONS) IMPLANT
PACK ABDOMINAL GYN (CUSTOM PROCEDURE TRAY) ×7 IMPLANT
PACK ROBOT WH (CUSTOM PROCEDURE TRAY) ×7 IMPLANT
PACK ROBOTIC GOWN (GOWN DISPOSABLE) ×7 IMPLANT
PACK TRENDGUARD 450 HYBRID PRO (MISCELLANEOUS) IMPLANT
PACK TRENDGUARD 600 HYBRD PROC (MISCELLANEOUS) IMPLANT
PACK VAGINAL WOMENS (CUSTOM PROCEDURE TRAY) ×7 IMPLANT
PAD MAGNETIC INST (MISCELLANEOUS) ×5 IMPLANT
PAD PREP 24X48 CUFFED NSTRL (MISCELLANEOUS) ×7 IMPLANT
PROTECTOR NERVE ULNAR (MISCELLANEOUS) ×14 IMPLANT
RTRCTR C-SECT PINK 25CM LRG (MISCELLANEOUS) ×5 IMPLANT
SET CYSTO W/LG BORE CLAMP LF (SET/KITS/TRAYS/PACK) ×7 IMPLANT
SET TRI-LUMEN FLTR TB AIRSEAL (TUBING) ×7 IMPLANT
SHEET LAVH (DRAPES) ×5 IMPLANT
SLING TVT EXACT (Sling) ×2 IMPLANT
SOLUTION ANTI FOG 6CC (MISCELLANEOUS) ×2 IMPLANT
SPONGE LAP 18X18 X RAY DECT (DISPOSABLE) ×5 IMPLANT
SPONGE SURGIFOAM ABS GEL 12-7 (HEMOSTASIS) IMPLANT
SURGIFLO W/THROMBIN 8M KIT (HEMOSTASIS) IMPLANT
SUT CAPIO ETHIBPND (SUTURE) IMPLANT
SUT ETHIBOND 0 (SUTURE) ×12 IMPLANT
SUT GORETEX NAB #0 THX26 36IN (SUTURE) IMPLANT
SUT PLAIN 2 0 XLH (SUTURE) ×5 IMPLANT
SUT VIC AB 0 CT1 18XCR BRD8 (SUTURE) ×5 IMPLANT
SUT VIC AB 0 CT1 27 (SUTURE) ×14
SUT VIC AB 0 CT1 27XBRD ANBCTR (SUTURE) ×15 IMPLANT
SUT VIC AB 0 CT1 8-18 (SUTURE) ×7
SUT VIC AB 2-0 CT1 (SUTURE) ×5 IMPLANT
SUT VIC AB 2-0 CT1 27 (SUTURE)
SUT VIC AB 2-0 CT1 TAPERPNT 27 (SUTURE) IMPLANT
SUT VIC AB 2-0 CT2 27 (SUTURE) ×8 IMPLANT
SUT VIC AB 2-0 SH 27 (SUTURE) ×14
SUT VIC AB 2-0 SH 27XBRD (SUTURE) ×20 IMPLANT
SUT VIC AB 2-0 UR6 27 (SUTURE) ×2 IMPLANT
SUT VIC AB 4-0 PS2 27 (SUTURE) ×14 IMPLANT
SUT VICRYL 0 UR6 27IN ABS (SUTURE) ×9 IMPLANT
SUT VLOC 180 2-0 6IN GS21 (SUTURE) IMPLANT
SUT VLOC 180 2-0 9IN GS21 (SUTURE) IMPLANT
SUT VLOC NONABS 0 BL6 GS-21 (SUTURE) IMPLANT
SYSTEM CARTER THOMASON II (TROCAR) ×2 IMPLANT
SYSTEM CONVERTIBLE TROCAR (TROCAR) ×7 IMPLANT
TIP UTERINE 5.1X6CM LAV DISP (MISCELLANEOUS) IMPLANT
TIP UTERINE 6.7X10CM GRN DISP (MISCELLANEOUS) IMPLANT
TIP UTERINE 6.7X6CM WHT DISP (MISCELLANEOUS) IMPLANT
TIP UTERINE 6.7X8CM BLUE DISP (MISCELLANEOUS) IMPLANT
TOWEL OR 17X24 6PK STRL BLUE (TOWEL DISPOSABLE) ×21 IMPLANT
TRAY FOLEY CATH SILVER 14FR (SET/KITS/TRAYS/PACK) ×7 IMPLANT
TRAY FOLEY CATH SILVER 16FR (SET/KITS/TRAYS/PACK) ×7 IMPLANT
TRENDGUARD 450 HYBRID PRO PACK (MISCELLANEOUS) ×7
TRENDGUARD 600 HYBRID PROC PK (MISCELLANEOUS)
TROCAR 12M 150ML BLUNT (TROCAR) ×7 IMPLANT
TROCAR DISP BLADELESS 8 DVNC (TROCAR) ×5 IMPLANT
TROCAR DISP BLADELESS 8MM (TROCAR) ×2
TROCAR OPTI TIP 5M 100M (ENDOMECHANICALS) ×2 IMPLANT
TROCAR PORT AIRSEAL 8X120 (TROCAR) ×7 IMPLANT
TROCAR XCEL 12X100 BLDLESS (ENDOMECHANICALS) ×2 IMPLANT
TROCAR XCEL NON-BLD 11X100MML (ENDOMECHANICALS) ×2 IMPLANT

## 2017-01-01 NOTE — Progress Notes (Signed)
Day of Surgery Procedure(s) (LRB): ROBOTIC ASSISTED LAPAROSCOPIC SACROCOLPOPEXY (N/A) ANTERIOR (CYSTOCELE) AND POSTERIOR REPAIR (RECTOCELE) (N/A) TRANSVAGINAL TAPE (TVT) PROCEDURE mid-urethral sling (N/A) CYSTOSCOPY (N/A) ROBOTIC ASSISTED BILATERAL SALPINGO OOPHORECTOMY, Extensive Lysis of Adhesions  Subjective: Patient reports tolerating PO clear liquids.   Hungry to eat more.  Receiving Toradol now for dysuria type pain.   Objective: I have reviewed patient's vital signs and intake and output. Vitals:   01/01/17 1745 01/01/17 1752  BP: 110/60 (!) 112/54  Pulse: 70 65  Resp: 19 16  Temp: 98.6 F (37 C) 98.7 F (37.1 C)  SpO2: 98% 97%    I/O - 2400 cc IV/600 cc UO.  General: alert and cooperative Resp: clear to auscultation bilaterally Cardio: regular rate and rhythm, S1, S2 normal, no murmur, click, rub or gallop GI: soft, non-tender; bowel sounds normal; no masses,  no organomegaly and incision: clean, dry and intact Extremities: PAS and Ted hose on. Vaginal Bleeding: minimal  Assessment: s/p Procedure(s): ROBOTIC ASSISTED LAPAROSCOPIC SACROCOLPOPEXY (N/A) ANTERIOR (CYSTOCELE) AND POSTERIOR REPAIR (RECTOCELE) (N/A) TRANSVAGINAL TAPE (TVT) PROCEDURE mid-urethral sling (N/A) CYSTOSCOPY (N/A) ROBOTIC ASSISTED BILATERAL SALPINGO OOPHORECTOMY, Extensive Lysis of Adhesions: stable  Plan: Full liquid diet.  Toradol and Morphine post op.  Ambulate later with assistance.  I reviewed surgical findings and procedure.  Questions answered.  LOS: 0 days    Vicki Turner 01/01/2017, 6:51 PM

## 2017-01-01 NOTE — Progress Notes (Signed)
Update to History and Physical Exam  No marked change in status since office preop visit.   Patient examined.   OK to proceed.  

## 2017-01-01 NOTE — OR Nursing (Signed)
Called pt husband in surgical waiting room to update him on surgical progress per Dr. Hyacinth MeekerMiller.

## 2017-01-01 NOTE — Progress Notes (Signed)
Day of Surgery Procedure(s) (LRB): ROBOTIC ASSISTED LAPAROSCOPIC SACROCOLPOPEXY (N/A) ANTERIOR (CYSTOCELE) AND POSTERIOR REPAIR (RECTOCELE) (N/A) TRANSVAGINAL TAPE (TVT) PROCEDURE mid-urethral sling (N/A) CYSTOSCOPY (N/A) ROBOTIC ASSISTED BILATERAL SALPINGO OOPHORECTOMY, Extensive Lysis of Adhesions  Subjective: Patient reports good pain control.  No nausea.  She wants something to eat but is advised it's not quite time.  Objective: I have reviewed patient's vital signs and intake and output.  General: alert and cooperative Resp: clear to auscultation bilaterally Cardio: regular rate and rhythm, S1, S2 normal, no murmur, click, rub or gallop GI: soft, non-distended, quiet BS, mildly tender to palpation Extremities: extremities normal, atraumatic, no cyanosis or edema and SCDs on an functioning Vaginal Bleeding: minimal  Assessment: s/p Procedure(s): ROBOTIC ASSISTED LAPAROSCOPIC SACROCOLPOPEXY (N/A) ANTERIOR (CYSTOCELE) AND POSTERIOR REPAIR (RECTOCELE) (N/A) TRANSVAGINAL TAPE (TVT) PROCEDURE mid-urethral sling (N/A) CYSTOSCOPY (N/A) ROBOTIC ASSISTED BILATERAL SALPINGO OOPHORECTOMY, Extensive Lysis of Adhesions: stable  Plan: Plan CBC and BMP in am Foley to SD tonight Advance diet at tolerated Continue IV SCDs for DVT prophylaxis   LOS: 0 days    Vicki Turner, Vicki Turner 01/01/2017, 5:46 PM

## 2017-01-01 NOTE — H&P (Signed)
Vicki Turner is an 66 y.o. female G2P2 MWF here for robotic assisted sacro-colpopexy with Dr. 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  She underwent a TAH due to endometriosis and bleeding issues.  She is now desirous of her ovaries being removed.  Procedure, alternatives, risks and benefits have been discussed.  Questions reviewed today.  Pt ready to proceed.  Pertinent Gynecological History: Menses: post-menopausal Bleeding: none Contraception: status post hysterectomy DES exposure: denies Blood transfusions: none Sexually transmitted diseases: no past history Previous GYN Procedures: TAH  Last mammogram: normal Date: 02/08/16 Last pap: not needed Date: not OB History: G2, P2   Menstrual History: Patient's last menstrual period was 02/20/1986 (approximate).    Past Medical History:  Diagnosis Date  . Anemia   . Arthritis    left knee  . Family history of adverse reaction to anesthesia    pt daughter very nauseous with anesthesia.  . H/O seasonal allergies    uses OTC med  . History of anemia    no problems now since Hysterectomy  . History of endometriosis   . Migraine    Nadolol was use to help-no longer a problem.  . Osteopenia   . Pneumonia    36 years ago  . Recurrent sinus infections    last Spring 2017   . Vaginal prolapse    "wears pessary"    Past Surgical History:  Procedure Laterality Date  . APPENDECTOMY    . COLONOSCOPY    . NASAL SINUS SURGERY  2002   again in 2002, revision  . TOTAL ABDOMINAL HYSTERECTOMY  1998   DUB/endometriosis  . WISDOM TOOTH EXTRACTION      Family History  Problem Relation Age of Onset  . Hypertension Maternal Grandfather   . Heart attack Father   . Miscarriages / Stillbirths Sister   . Transient ischemic attack Maternal Grandmother   . Osteoarthritis Mother     Social History:  reports that  has never smoked. she has never used smokeless tobacco. She reports that she drinks about 3.0 oz of  alcohol per week. She reports that she does not use drugs.  Allergies:  Allergies  Allergen Reactions  . Novocain [Procaine] Hives and Other (See Comments)    Jittery feeling.  States she can take Lidocaine.    Medications Prior to Admission  Medication Sig Dispense Refill Last Dose  . citalopram (CELEXA) 20 MG tablet Take 1 tablet (20 mg total) by mouth daily. (Patient taking differently: Take 20 mg by mouth every evening. ) 90 tablet 4 12/31/2016 at 2000  . clobetasol (TEMOVATE) 0.05 % external solution Apply 1 application topically 2 (two) times daily as needed (APPLY TO SCALP 1 TO 2 TIMES A DAY FOR FLARES).   Past Week at Unknown time  . conjugated estrogens (PREMARIN) vaginal cream 1/2 gm vaginally twice weekly only 30 g 0 Past Month at Unknown time  . fluticasone (FLONASE) 50 MCG/ACT nasal spray Place 1-2 sprays into both nostrils daily as needed (for nasal congestion.).    01/01/2017 at 0540  . gabapentin (NEURONTIN) 300 MG capsule Take 1 capsule (300 mg total) by mouth at bedtime. 90 capsule 4 12/31/2016 at 2000  . ibuprofen (ADVIL,MOTRIN) 800 MG tablet Take 1 tablet (800 mg total) by mouth every 8 (eight) hours as needed. 30 tablet 0 Past Month at Unknown time  . loratadine (CLARITIN) 10 MG tablet Take 10 mg by mouth daily.   01/01/2017 at 0545  . nadolol (  CORGARD) 20 MG tablet TAKE 1 TABLET (20 MG TOTAL) BY MOUTH DAILY. (Patient taking differently: TAKE 1 TABLET (20 MG TOTAL) BY MOUTH DAILY IN THE EVENING) 90 tablet 4 12/31/2016 at 2000  . oxyCODONE-acetaminophen (PERCOCET) 5-325 MG tablet Take 2 tablets by mouth every 4 (four) hours as needed. use only as much as needed to relieve pain 30 tablet 0   . SUMAtriptan (IMITREX) 100 MG tablet Take 1 tablet (100 mg total) by mouth every 2 (two) hours as needed for migraine. 9 tablet 4 More than a month at Unknown time    Review of Systems  All other systems reviewed and are negative.   Blood pressure 119/80, pulse (!) 53, temperature  98 F (36.7 C), temperature source Oral, resp. rate 16, last menstrual period 02/20/1986, SpO2 98 %. Physical Exam  Constitutional: She is oriented to person, place, and time. She appears well-developed and well-nourished.  Cardiovascular: Normal rate and regular rhythm.  Respiratory: Effort normal and breath sounds normal.  Neurological: She is alert and oriented to person, place, and time.  Skin: Skin is warm and dry.  Psychiatric: She has a normal mood and affect.    No results found for this or any previous visit (from the past 24 hour(s)).  No results found.  Assessment/Plan: 66 yo G2P2 MWF here for robotic sacro-colpopexy, BSO, A&P repair and cystoscope.  All questions answered.  Pt ready to proceed.  Vicki Turner, Vicki Turner Vicki Turner 01/01/2017, 7:08 AM

## 2017-01-01 NOTE — Op Note (Signed)
01/01/2017  3:37 PM  PATIENT:  Vicki Turner  66 y.o. female  PRE-OPERATIVE DIAGNOSIS:  incomplete vaginal vault prolapse after hysterectomy, cystocele, rectocele, genuine stress incontinence, desirous of BSO  POST-OPERATIVE DIAGNOSIS:  incomplete vaginal vault prolapse after hysterectomy, cystocele, rectocele,genuine stress incontinence, desirous of BSO  PROCEDURE:  Procedure(s): ROBOTIC ASSISTED LAPAROSCOPIC SACROCOLPOPEXY ANTERIOR (CYSTOCELE) AND POSTERIOR REPAIR (RECTOCELE) TRANSVAGINAL TAPE (TVT) PROCEDURE mid-urethral sling CYSTOSCOPY ROBOTIC ASSISTED BILATERAL SALPINGO OOPHORECTOMY, Extensive Lysis of Adhesions  SURGEON:  Jontae Sonier SUZANNE  ASSISTANTS: Conley SimmondsBrook Silva, MD  ANESTHESIA:   general  ESTIMATED BLOOD LOSS: 125 mL (total case)  BLOOD ADMINISTERED:none   FLUIDS: 1500cc for entire case  UOP: 200cc clear  SPECIMEN:  Bilateral tubes and ovaries  DISPOSITION OF SPECIMEN:  PATHOLOGY  FINDINGS: atrophic ovaries that were adhered to the pelvic side wall.  Bowel was adhered to the left sidewall and the left ovary  DESCRIPTION OF OPERATION: DESCRIPTION OF OPERATION: Patient is taken to the operating room. She is placed in the supine position. She is a running IV in place. Informed consent was present on the chart. SCDs on her lower extremities and functioning properly. General endotracheal anesthesia was administered by the anesthesia staff without difficulty.  Dr. Jean RosenthalJackson oversaw the case.  Once adequate anesthesia was confirmed the legs are placed in the low lithotomy position in [place] stirrups. Her arms were tucked by the side. Trend guard was used for positioning in Trendelenburg positioning.  Tilt test performed.  Time out performed.  Dr. Edward JollySilva was present from the beginning of the case.   Foley catheter was placed and an EEA sizer, medium, was placed in the vagina.  The umbilicus was everted.  A Veress needle was obtained. Syringe of sterile saline was placed on a  open Veress needle.  This was passed into the umbilicus until just when the fluid started to drip.  Then low flow CO2 gas was attached the needle and the pneumoperitoneum was achieved without difficulty. Once 3 liters of gas was in the abdomen the Veress needle was removed and a 5 millimeter non-bladed Optiview trocar and port were passed directly to the abdomen.  The laparoscope was then used to confirm intraperitoneal placement. The upper abdomen and pelvic were surveyed to make sure that we felt we could proceed laparoscopically.  We felt we could.  However, ovaries were small and atrophic and densely adhered to the pelvic side walls.    Incisions locations were noted and skin was transilluminated.  Skin was anesthatized with 0.25% Marcaine.  These were placed 10cm to the right and left of the umbilicus (Robotic arms 1 and 2) and then 10cm more laterally and infertility on each side.  The left incision was for the #3 arm and the right sided incision was for a 12mm assistants ports.  Then 8mm nondisposable trocar ports were passed directly into the abdomen.  Also on the right lower quadrant a 12 bladed trocar and port were also passed directly into the abdomen. All trochars were removed.    Then, another 12mm skin incision was made and a bladed 12 port was placed just above the umbilicus.  The Robotic laparoscope was used through this incision.  The umbilical incision was sutured to help maintain pneumoperitoneum.  The table was placed on the floor and the patient was placed in steep Trendelenburg.  The robot was docked in a normal standard fashion to the left of the table. In the #1 arm was placed endoscopic scissors with monopolar cautery attached  and then the #2 arm was placed [place] with bipolar cautery attached.  In #3 arm, the Prograsp was placed.    Right ureter was identified.  Left could not be see due to bowel adhesions.  The bowel was adhered all the way to the cul de sac.  Starting in the  cul de sac, the bowel adhesions were taken down with sharp dissection.  Monopolar cautery was used as needed.  This ultimately freed the bowel from the left side wall.  The left ureter could be seen but was running underneath the ovarian scar.  Starting inferiorly, with careful and slow dissection, the ovary was freed from the side wall inferiorly so that now the ovary scar was above the ureter.  Then the left IP was isolated, clamped, cauterized and incised.  The remaining adhesions of the left ovary were taken down with sharp dissection freeing the left ovary and tube.    Then attention was turned to the right side.  The right IP ligament was isolated by opening the side wall and making a peritoneal window below the right ovary and above the right ureter.  The IP ligament was then serially clamped, cauterized and incised.  Using careful dissection, the right ovary and tube were slowly freed from the right sidewall.  This dissection was carried down to location close to the bladder so the dissection was done slowly and carefully.    Once both ovaries were free, specimens were delivered through the RLQ port.  Pelvis was irrigated.  Minimal blood loss was noted for this portion of the procedure.    Dr. Edward JollySilva was ready to take over the procedure at this point.  The remainder of the procedure will be done with her dictation.    The procedure started at 7:30am and I left the console a little after 10am due to the extensive adhesions that were present.    COUNTS:  YES  PLAN OF CARE: Transfer to PACU

## 2017-01-01 NOTE — Anesthesia Postprocedure Evaluation (Signed)
Anesthesia Post Note  Patient: Vicki Turner  Procedure(s) Performed: ROBOTIC ASSISTED LAPAROSCOPIC SACROCOLPOPEXY (N/A Abdomen) ANTERIOR (CYSTOCELE) AND POSTERIOR REPAIR (RECTOCELE) (N/A ) TRANSVAGINAL TAPE (TVT) PROCEDURE mid-urethral sling (N/A Vagina ) CYSTOSCOPY (N/A Bladder) ROBOTIC ASSISTED BILATERAL SALPINGO OOPHORECTOMY, Extensive Lysis of Adhesions (Abdomen)     Patient location during evaluation: PACU Anesthesia Type: General Level of consciousness: awake and alert, patient cooperative and oriented Pain management: pain level controlled Vital Signs Assessment: post-procedure vital signs reviewed and stable Respiratory status: spontaneous breathing, nonlabored ventilation, respiratory function stable and patient connected to nasal cannula oxygen Cardiovascular status: blood pressure returned to baseline and stable Postop Assessment: no apparent nausea or vomiting Anesthetic complications: no    Last Vitals:  Vitals:   01/01/17 1730 01/01/17 1752  BP: (!) 105/59 (!) 112/54  Pulse: 70 65  Resp: 13 16  Temp:  37.1 C  SpO2: 98% 97%    Last Pain:  Vitals:   01/01/17 1752  TempSrc:   PainSc: 3    Pain Goal: Patients Stated Pain Goal: 4 (01/01/17 1752)               Erling CruzJACKSON,E. Tamatha Gadbois

## 2017-01-01 NOTE — Anesthesia Procedure Notes (Signed)
Procedure Name: Intubation Date/Time: 01/01/2017 7:30 AM Performed by: Vista LawmanEargle, Mauro Arps E, CRNA Pre-anesthesia Checklist: Patient identified, Emergency Drugs available, Suction available and Patient being monitored Patient Re-evaluated:Patient Re-evaluated prior to induction Oxygen Delivery Method: Circle system utilized Preoxygenation: Pre-oxygenation with 100% oxygen Induction Type: IV induction Ventilation: Mask ventilation without difficulty Laryngoscope Size: Miller and 2 Grade View: Grade I Tube type: Oral Number of attempts: 1 Airway Equipment and Method: Stylet Placement Confirmation: ETT inserted through vocal cords under direct vision,  positive ETCO2 and breath sounds checked- equal and bilateral Secured at: 22 cm Tube secured with: Tape Dental Injury: Teeth and Oropharynx as per pre-operative assessment

## 2017-01-01 NOTE — Brief Op Note (Signed)
01/01/2017  3:43 PM  PATIENT:  Driscilla MoatsEllen P Werk  66 y.o. female  PRE-OPERATIVE DIAGNOSIS:  incomplete vaginal vault prolapse after hysterectomy, cystocele, rectocele, genuine stress incontinence  POST-OPERATIVE DIAGNOSIS:  incomplete vaginal vault prolapse after hysterectomy, cystocele, rectocele,genuine stress incontinence  PROCEDURE:  Procedure(s): ROBOTIC ASSISTED LAPAROSCOPIC SACROCOLPOPEXY (N/A) ANTERIOR (CYSTOCELE) AND POSTERIOR REPAIR (RECTOCELE) (N/A) TRANSVAGINAL TAPE (TVT) PROCEDURE mid-urethral sling (N/A) CYSTOSCOPY (N/A) ROBOTIC ASSISTED BILATERAL SALPINGO OOPHORECTOMY, Extensive Lysis of Adhesions  SURGEON:  Surgeon(s) and Role:    * Amundson Shirley Friar Silva, Brook E, MD - Primary    * Genia DelLavoie, Marie-Lyne, MD - Collene GobbleProctor    * Miller, Mary S, MD -  Primary  PHYSICIAN ASSISTANT:  NA  ASSISTANTS:   See Above  ANESTHESIA:   General endotracheal, local with Marcaine 0.25% and Lidocaine 1% with epinephrine 1:100,000.  IVF:  1500 cc.  EBL:  125 mL   UO:  200 cc.   BLOOD ADMINISTERED:none  DRAINS: Urinary Catheter (Foley)   LOCAL MEDICATIONS USED:  Marcaine 0.25% and Lidocaine 1% with epinephrine 1:100,000.   SPECIMEN:  Source of Specimen:  Bilateral tubes and ovaries.  DISPOSITION OF SPECIMEN:  PATHOLOGY  COUNTS:  YES  TOURNIQUET:  * No tourniquets in log *  DICTATION: .Note written in EPIC  PLAN OF CARE: Admit for overnight observation  PATIENT DISPOSITION:  PACU - hemodynamically stable.   Delay start of Pharmacological VTE agent (>24hrs) due to surgical blood loss or risk of bleeding: not applicable

## 2017-01-01 NOTE — Op Note (Signed)
OPERATIVE REPORT  PREOPERATIVE DIAGNOSIS: Post hysterectomy vaginal vault prolapse,  cystocele, rectocele, genuine stress incontinence.  POSTOPERATIVE DIAGNOSIS: Post hysterectomy vaginal vault prolapse,  cystocele, rectocele, genuine stress incontinence.  PROCEDURE:  Robotically assisted laparoscopic sacral colpopexy, anterior  and posterior colporrhaphy, TVT Exact midurethral sling and cystoscopy.  SURGEON: Randye Lobo, MD  ASSISTANT:  Jerene Bears, MD  ANESTHESIA: General endotracheal, local 1% lidocaine with epinephrine 1:100,000.  IV FLUIDS:  1500 cc total IVF for surgical procedure  EBL:  125 cc total for surgical procedure   URINE OUTPUT:  200 cc total for surgical procedure  COMPLICATIONS: None.  INDICATIONS FOR THE PROCEDURE: The patient is a 66 year old  Para 2 Caucasian female referred from the patient's primary gynecologist,  Dr. Leda Quail, with post hysterectomy pelvic organ prolapse and  genuine stress incontience.  She is status post total abdominal hysterectomy  for endometriosis.  The patient has a second degree cystocele, first to second  degree vault prolapse, and a first degree rectocele.   Multichannel urodynamic  testing confirms the presence of stress incontinence with reduction of the  prolapse using the patient's pessary.  The patient declines continued use of the  pessary and she requests prolapse repair, surgical treatment of stress incontinence,  and removal of her tubes and ovaries.  Surgical care will be coordinated with Dr.  Hyacinth Meeker who will perform a robotic bilateral salpingo-oophorectomy.  I will proceed with  a robotic sacrocolpopexy, TVT Exact mid urethral sling, cystoscopy, and anterior and posterior  colporrhaphy. Risks, benefits, and alternatives have been reviewed with the patient  who agrees with the surgical plan.   FINDINGS: Examination under anesthesia revealed a second degree cystocele,  first degree  vaginal vault prolapse, and a first degree rectocele.  The uterus was  absent.  Please refer to Dr. Rondel Baton operative note for the initial findings of bilateral  tubes and ovaries densely adherent the their ipsilateral side walls.  The uterus was  surgically absent.  The upper abdomen demonstrated a normal liver and stomach.       Cystoscopy at termination of the bladder portion of the procedure documented the  bladder to be intact throughout 360 degrees including the bladder dome and trigone.  There was no evidence of a foreign body in the bladder or the urethra. The ureters  were patent bilaterally.  SPECIMENS:  None.   PROCEDURE IN DETAIL: The patient was reidentified in the preop hold area. She did receive cefotetan 2 g IV for antibiotic prophylaxis. She received both TED hose and PAS stockings for DVT prophylaxis.  The patient was escorted to the operating room, where she was placed in the dorsal lithotomy position in [place] stirrups. General endotracheal anesthesia was induced. The patient's lower abdomen, vagina, and perineum were sterilely prepped and she was sterilely draped. The Foley catheter was placed inside the bladder and left to gravity drainage throughout and at the termination of the procedure.  Please refer to Dr. Rondel Baton dictation for the placement of the trocars,  docking of the robot, lysis of adhesions, and robotic bilateral salpingo- oophorectomy.  Hemostasis was good at the end of this portion of the procedure.  I then stepped to the surgeon's consul. The robotic sacrocolpopexy was  performed next. An EEA Sizer was placed in the vagina.  The prograsp instrument was used to sweep the large intestine toward the left  abdominal wall, and attention at this time was turned to the sacral promontory.  The peritoneum overlying the  sacral promontory was identified along with the landmarks of the bifurcation of the aorta and iliac vessels and the right  ureter.  The peritoneum was tented and opened using the robotic monopolar cautery  scissors. Careful dissection was performed down to the level of the  sacral promontory. The anterior longitudinal ligament was identified along with  the middle sacral vessels.The middle sacral vein was cauterized with the PK  Bipolar forceps. The peritoneum was then opened inferiorly along the patient's  right pelvic sidewall and a tunnel of the peritoneum was created.   The dissection of the anterior and posterior peritoneum over the anterior and  posterior vagina was performed with the monopolar cautery scissors and with The PK instrument for hemostasis along the left bladder pillar.  The Colpoplast mesh was trimmed to properly fit the patient's anatomy and the  mesh was secured to the anterior vagina using a series of five 0 Ethibond sutures.  The same was performed along the posterior vaginal wall using four 0 Ethibond  sutures.  Two sutures of 0 Ethibond were brought through the anterior longitudinal ligament  to the right and to the left of the middle sacral vessels.   The mesh was brought through the tunnel and up to the sacral promontory. The EEA Sizer was removed and a hand was placed inside the vagina and the elevation of the vaginal cuff was simulated.  These sutures were then brought through the mesh. The sutures were tied. Excess mesh was trimmed at the level of the sacral attachment.  A running suture of 2/0  Vicryl was used to close the peritoneum over the mesh  from the vaginal cuff to the sacral promontory.  The pelvis was once again irrigated and suctioned. Hemostasis was good. The insufflation pressure was reduced, and there was no evidence of bleeding.  The 12 mm port in the right lower quadrant was then removed, and a 0/0 Vicryl suture was placed using the [employer] Thomison closure device.  The remaining trocars were removed and the pneumoperitoneum was released.      Attention was turned to the vaginal portion of the procedure at this point. The patient was examined and there was excellent vaginal cuff support. There was evidence of a first degree cystocele and a first degree rectocele.  Allis clamps were used to mark the anterior vaginal wall beginning 1 cm below the urethral meatus and extending down toward the vaginal cuff. The mucosa was injected locally with 1% lidocaine with epinephrine 1:100,000. The mucosa was incised vertically in the midline with a scalpel and a combination of sharp and blunt dissection were used to dissect this vaginal tissue and mucosa off the underlying bladder. The dissection was carried back to the pubic rami.  A 1 cm suprapubic incisions were then created 2 cm to the right and left in the midline. The Foley catheter was removed. The TVT Exact was performed in a bottom up fashion. The urethral guide and Foley tip were placed in the urethra and the urethra was deflected properly as the TVT guide was brought through the right retropubic space and up through the right suprapubic incision. The urethra was then deflected in the opposite direction and the TVT guide was placed through the left retropubic space and through the left suprapubic ipsilateral incision.  The obturator guide was removed at this time and cystoscopy was performed and the findings were as noted above.  The bladder was drained of all cystoscopic fluid and the Foley catheter was replaced.  The sling was brought up through the suprapubic incisions bilaterally. The plastic sheaths were removed as a Kelly clamp was placed between the urethra and the sling. The sling was noted to be in excellent position. Excess mesh was trimmed suprapubically. The anterior colporrhaphy was performed with vertical mattress sutures of 2/0 Vicryl.  The vaginal skin edges were trimmed at this time and the anterior vaginal wall was closed with a running  lock suture of 2-0 Vicryl.  The suprapubic incisions were closed at the termination of the procedure with Dermabond.  Attention was turned to the posterior vaginal wall at this time. Allis clamps were used to mark the introitus, the perineal body, and then the posterior vaginal wall mucosa. The perineal body and the posterior vaginal mucosa were injected with 1% lidocaine with epinephrine 1:100,000. A diamond shaped wedge of epithelium was excised from the perineal body and the posterior vaginal mucosa was incised vertically in the midline with the Metzenbaum scissors. Sharp and blunt dissection were used to dissect the perirectal fascia off the vaginal mucosa bilaterally. The posterior colporrhaphy was then performed with vertical mattress sutures of 0 Vicryl which reduced the rectocele.  Rectal exam confirmed the absence of sutures in the rectum.Excess vaginal mucosa was trimmed and the posterior vaginal mucosa was closed with a running lock suture of 2-0 Vicryl. This was brought down to the hymenal ring and then under the hymen. The transverse perineal muscles were reapproximated with a running suture of this 2-0 Vicryl. The suture was then brought up the perineal body in a subcuticular fashion as for an episiotomy repair. The knot was tied at the hymen.  Vaginal examination confirmed excellent vaginal support and elevation.   At this point, the patient was awakened and extubated, and escorted to the recovery room in stable condition. There were no complications. All needle, instrument, and sponge counts were correct.   Randye LoboBrook E. Silva, M.D.

## 2017-01-01 NOTE — Transfer of Care (Signed)
Immediate Anesthesia Transfer of Care Note  Patient: Vicki Turner  Procedure(s) Performed: ROBOTIC ASSISTED LAPAROSCOPIC SACROCOLPOPEXY (N/A Abdomen) ANTERIOR (CYSTOCELE) AND POSTERIOR REPAIR (RECTOCELE) (N/A ) TRANSVAGINAL TAPE (TVT) PROCEDURE mid-urethral sling (N/A Vagina ) CYSTOSCOPY (N/A Bladder) ROBOTIC ASSISTED BILATERAL SALPINGO OOPHORECTOMY, Extensive Lysis of Adhesions (Abdomen)  Patient Location: PACU  Anesthesia Type:General  Level of Consciousness: awake and alert   Airway & Oxygen Therapy: Patient Spontanous Breathing and Patient connected to nasal cannula oxygen  Post-op Assessment: Report given to RN and Post -op Vital signs reviewed and stable  Post vital signs: Reviewed and stable  Last Vitals:  Vitals:   01/01/17 0628  BP: 119/80  Pulse: (!) 53  Resp: 16  Temp: 36.7 C  SpO2: 98%    Last Pain:  Vitals:   01/01/17 0628  TempSrc: Oral      Patients Stated Pain Goal: 4 (01/01/17 [id number])  Complications: No apparent anesthesia complications

## 2017-01-02 DIAGNOSIS — N993 Prolapse of vaginal vault after hysterectomy: Secondary | ICD-10-CM | POA: Diagnosis not present

## 2017-01-02 LAB — BASIC METABOLIC PANEL
Anion gap: 6 (ref 5–15)
BUN: 22 mg/dL — AB (ref 6–20)
CHLORIDE: 99 mmol/L — AB (ref 101–111)
CO2: 24 mmol/L (ref 22–32)
Calcium: 8.2 mg/dL — ABNORMAL LOW (ref 8.9–10.3)
Creatinine, Ser: 0.77 mg/dL (ref 0.44–1.00)
GFR calc Af Amer: >60 mL/min (ref >60)
GFR calc non Af Amer: >60 mL/min (ref >60)
Glucose, Bld: 97 mg/dL (ref 65–99)
POTASSIUM: 3.7 mmol/L (ref 3.5–5.1)
SODIUM: 129 mmol/L — AB (ref 135–145)

## 2017-01-02 LAB — CBC
HCT: 29.5 % — ABNORMAL LOW (ref 36.0–46.0)
Hemoglobin: 10.1 g/dL — ABNORMAL LOW (ref 12.0–15.0)
MCH: 32.3 pg (ref 26.0–34.0)
MCHC: 34.2 g/dL (ref 30.0–36.0)
MCV: 94.2 fL (ref 78.0–100.0)
Platelets: 220 10*3/uL (ref 150–400)
RBC: 3.13 MIL/uL — AB (ref 3.87–5.11)
RDW: 14.5 % (ref 11.5–15.5)
WBC: 8.9 10*3/uL (ref 4.0–10.5)

## 2017-01-02 MED ORDER — CIPROFLOXACIN HCL 250 MG PO TABS: ORAL_TABLET | ORAL | 0 refills | Status: DC

## 2017-01-02 NOTE — Progress Notes (Signed)
1 Day Post-Op Procedure(s) (LRB): ROBOTIC ASSISTED LAPAROSCOPIC SACROCOLPOPEXY (N/A) ANTERIOR (CYSTOCELE) AND POSTERIOR REPAIR (RECTOCELE) (N/A) TRANSVAGINAL TAPE (TVT) PROCEDURE mid-urethral sling (N/A) CYSTOSCOPY (N/A) ROBOTIC ASSISTED BILATERAL SALPINGO OOPHORECTOMY, Extensive Lysis of Adhesions  Subjective: Patient reports good pain control.  No nausea.  No emesis.  Mild vaginal spotting.  Foley removed this morning.    Objective: I have reviewed patient's vital signs, intake and output, medications and labs.  General: alert and cooperative Resp: clear to auscultation bilaterally Cardio: regular rate and rhythm, S1, S2 normal, no murmur, click, rub or gallop GI: soft, non-tender; bowel sounds normal; no masses,  no organomegaly and incision: clean, dry and intact Extremities: extremities normal, atraumatic, no cyanosis or edema Vaginal Bleeding: minimal  Assessment: s/p Procedure(s): ROBOTIC ASSISTED LAPAROSCOPIC SACROCOLPOPEXY (N/A) ANTERIOR (CYSTOCELE) AND POSTERIOR REPAIR (RECTOCELE) (N/A) TRANSVAGINAL TAPE (TVT) PROCEDURE mid-urethral sling (N/A) CYSTOSCOPY (N/A) ROBOTIC ASSISTED BILATERAL SALPINGO OOPHORECTOMY, Extensive Lysis of Adhesions: stable and progressing well  Plan: Voiding trials today.  Hopeful d/c later today.  LOS: 0 days    Valentina ShaggyMILLER, Mandalyn Pasqua [facility] 01/02/2017, 1:09 PM

## 2017-01-02 NOTE — Progress Notes (Signed)
1 Day Post-Op Procedure(s) (LRB): ROBOTIC ASSISTED LAPAROSCOPIC SACROCOLPOPEXY (N/A) ANTERIOR (CYSTOCELE) AND POSTERIOR REPAIR (RECTOCELE) (N/A) TRANSVAGINAL TAPE (TVT) PROCEDURE mid-urethral sling (N/A) CYSTOSCOPY (N/A) ROBOTIC ASSISTED BILATERAL SALPINGO OOPHORECTOMY, Extensive Lysis of Adhesions  Subjective: Patient reports tolerating PO and + flatus.   Foley out and no void yet.  Ambulating.  One pad change overnight that was saturated per patient.  Used Toradol and no narcotics post op.  Dr. Hyacinth MeekerMiller present for a large part of this visit.   Objective: I have reviewed patient's vital signs and intake and output. Vitals:   01/01/17 2350 01/02/17 0317  BP: (!) 129/52 (!) 112/58  Pulse: 66 (!) 59  Resp: 18 18  Temp: 97.9 F (36.6 C) 98.2 F (36.8 C)  SpO2: 98% 96%   I/O - 3483 cc/1075 cc  General: alert and cooperative Resp: clear to auscultation bilaterally Cardio: regular rate and rhythm, S1, S2 normal, no murmur, click, rub or gallop GI: soft, non-tender; bowel sounds normal; no masses,  no organomegaly and incision: clean, dry, intact and mild circular ecchymoses around laparoscopic incisions.  Vaginal Bleeding: minimal  Assessment: s/p Procedure(s): ROBOTIC ASSISTED LAPAROSCOPIC SACROCOLPOPEXY (N/A) ANTERIOR (CYSTOCELE) AND POSTERIOR REPAIR (RECTOCELE) (N/A) TRANSVAGINAL TAPE (TVT) PROCEDURE mid-urethral sling (N/A) CYSTOSCOPY (N/A) ROBOTIC ASSISTED BILATERAL SALPINGO OOPHORECTOMY, Extensive Lysis of Adhesions: progressing well  Plan: Voiding trials.   Check CBC and BMP.  Not back yet.  Surgical findings and procedure reviewed with patient.  Discharge instructions and precautions reviewed with the patient.  She already has her Percocet and Motrin Rx.  Follow up in 8 days.    LOS: 0 days    Vicki Turner 01/02/2017, 8:00 AM

## 2017-01-02 NOTE — Discharge Instructions (Signed)
Anterior and Posterior Colporrhaphy and Sling Procedure, Care After This sheet gives you information about how to care for yourself after your procedure. Your health care provider may also give you more specific instructions. If you have problems or questions, contact your health care provider. What can I expect after the procedure? After the procedure, it is common to have:  Pain in the surgical area.  Vaginal discharge. You will need to use a sanitary pad during this time.  Fatigue.  Follow these instructions at home: Incision care  Follow instructions from your health care provider about how to take care of your incision. Make sure you: ? Wash your hands with soap and water before touching the incision area. If soap and water are not available, use hand sanitizer. ? Clean your incision as told by your health care provider. ? Leave stitches (sutures), skin glue, or adhesive strips in place. These skin closures may need to stay in place for 2 weeks or longer. If adhesive strip edges start to loosen and curl up, you may trim the loose edges. Do not remove adhesive strips completely unless your health care provider tells you to do that.  Check your incision area every day for signs of infection. Check for: ? Redness, swelling, or pain. ? Fluid or blood. ? Warmth. ? Pus or a bad smell.  Check your incision every day to make sure the incision area is not separating or opening.  Do not take baths, swim, or use a hot tub until your health care provider approves. You may shower.  Keep the area between your vagina and rectum (perineal area) clean and dry. Make sure you clean the area after each bowel movement and each time you urinate.  Ask your health care provider if you can take a sitz bath or sit in a tub of clean, warm water. Activity  Do gentle, daily activity as told by your health care provider. You may be told to take short walks every day and go farther each time. Ask your health  care provider what activities are safe for you.  Limit stair climbing to once or twice a day in the first week, then slowly increase this activity.  Do not lift anything that is heavier than 10 lbs. (4.5 kg), or the limit that your health care provider tells you, until he or she says that it is safe. Avoid pushing or pulling motions.  Avoid standing for long periods of time.  Do not douche, use tampons, or have sex until your health care provider says it is okay.  Do not drive or use heavy machinery while taking prescription pain medicine. To prevent constipation  To prevent or treat constipation while you are taking prescription pain medicine, your health care provider may recommend that you: ? Take over-the-counter or prescription medicines. ? Eat foods that are high in fiber, such as fresh fruits and vegetables, whole grains, and beans. ? Drink enough fluid to keep your urine clear or pale yellow. ? Limit foods that are high in fat and processed sugars, such as fried and sweet foods. General instructions  You may be instructed to do pelvic floor exercises (kegels) as told by your health care provider.  Take over-the-counter and prescription medicines only as told by your health care provider.  Keep all follow-up visits as told by your health care provider. This is important. Contact a health care provider if:  Medicine does not help your pain.  You have frequent or urgent urination, or  you are unable to completely empty your bladder.  You feel a burning sensation when urinating.  You have fluid or blood coming from your incision.  You have pus or a bad smell coming from the incision.  Your incision feels warm to the touch.  You have redness, swelling, or pain around your incision. Get help right away if:  You have a fever or chills.  Your incision separates or opens.  You cannot urinate.  You have trouble breathing. Summary  After the procedure, it is common to  have pain, fatigue, and discharge from the vagina.  Keep the area between your vagina and rectum (perineal area) clean and dry. Make sure you clean the area after each bowel movement and each time you urinate.  Follow instructions from your health care provider on any activity restrictions after the procedure. This information is not intended to replace advice given to you by your health care provider. Make sure you discuss any questions you have with your health care provider. Document Released: 09/21/2003 Document Revised: 02/07/2016 Document Reviewed: 02/07/2016 Elsevier Interactive Patient Education  2017 [employer]. Sacrocolpopexy, Care After Refer to this sheet in the next few weeks. These instructions provide you with information on caring for yourself after your procedure. Your health care provider may also give you more specific instructions. Your treatment has been planned according to current medical practices, but problems sometimes occur. Call your health care provider if you have any problems or questions after your procedure. What can I expect after the procedure? After your procedure, it is typical to have the following:  Pain.  Some vaginal bleeding.  Fatigue.  Follow these instructions at home: Medicine  Only take medicines as directed by your health care provider.  Make sure to finish antibiotic medicine even if you start to feel better. Bandages  Care for your bandages and cuts (incisions) as directed by your surgeon.  Do not shower until after your bandages have been removed. Activity  Take at least two 10-minute walks each day.  Ask your health care provider when you can return to work and do all your usual activities. It may take 3 months to recover completely.  You will have to restrict many activities when you first return home. For at least 6 weeks: ? Do not lift anything heavier than a gallon of milk. ? Do not sit on a bike seat. ? Do not use a  tampon or put anything inside your vagina. ? Do not have sexual intercourse. ? Do not participate instrenuous activities like running or aerobics. Other Instructions  Do not take a bath or go swimming until your health care provider says you can. Most people can shower after about 6 weeks after the procedure.  Drink enough fluids to keep your urine clear or pale yellow. This helps prevent constipation.  See your health care provider to have your stitches or staples removed if necessary.  Keep all follow-up appointments. Contact a health care provider if:  You have chills or fever.  Your pain medicine is not helping.  You have vaginal bleeding or discharge that will not go away. Get help right away if:  You have a fever for more than 2-3 days.  You have very bad pain.  You have heavy vaginal bleeding or discharge.  You have any signs of infection around your cut (incision). Watch for: ? Redness. ? Tenderness. ? Warmth. ? Pus or other discharge.  You develop a warm, tender area in your leg.  You have chest pain or trouble breathing. This information is not intended to replace advice given to you by your health care provider. Make sure you discuss any questions you have with your health care provider. Document Released: 02/11/2013 Document Revised: 07/15/2015 Document Reviewed: 01/10/2013 Elsevier Interactive Patient Education  [employer]2018 [employer]

## 2017-01-02 NOTE — Progress Notes (Signed)
Pt out in wheelchair  Teaching complete   Cath care reviewed

## 2017-01-03 ENCOUNTER — Telehealth: Payer: Self-pay | Admitting: Obstetrics and Gynecology

## 2017-01-03 NOTE — Telephone Encounter (Signed)
Patient called states had surgery Monday and that Dr Edward JollySilva wanted her to call Kennon RoundsSally to make an appointment for next Tuesday.

## 2017-01-03 NOTE — Telephone Encounter (Signed)
Return call to patient. States she is doing well at home. Sore when getting up and down but otherwise feeling well.  1 week post op scheduled for Tuesday 01-09-17 at 2:30 pm.  Patient asking about catheter removal.  Advised Dr Edward JollySilva plans for this to be done on 01-09-17.  If any changes to this plan we will call her back.    Routing to provider for final review. Patient agreeable to disposition. Will close encounter.

## 2017-01-09 ENCOUNTER — Encounter: Payer: Self-pay | Admitting: Obstetrics and Gynecology

## 2017-01-09 ENCOUNTER — Ambulatory Visit (INDEPENDENT_AMBULATORY_CARE_PROVIDER_SITE_OTHER): Payer: Medicare Other | Admitting: Obstetrics and Gynecology

## 2017-01-09 VITALS — BP 126/68 | HR 68 | Resp 14 | Ht 66.0 in | Wt 156.0 lb

## 2017-01-09 DIAGNOSIS — R339 Retention of urine, unspecified: Secondary | ICD-10-CM

## 2017-01-09 NOTE — Progress Notes (Signed)
GYNECOLOGY  VISIT   HPI: 66 y.o.   Married  Caucasian  female   G2P2002 with Patient's last menstrual period was 02/20/1986 (approximate).   here for post op from 01/01/17 ROBOTIC ASSISTED LAPAROSCOPIC SACROCOLPOPEXY (N/A Abdomen) ANTERIOR (CYSTOCELE) AND POSTERIOR REPAIR (RECTOCELE) (N/A ) TRANSVAGINAL TAPE (TVT) PROCEDURE mid-urethral sling (N/A Vagina ) CYSTOSCOPY (N/A Bladder) ROBOTIC ASSISTED BILATERAL SALPINGO OOPHORECTOMY, Extensive Lysis of Adhesions (Abdomen)     Here for foley removal.  Had urinary retention at hospital post op and was discharged to home with a Foley catheter.  Some fatigue. Much less discomfort the last few days.  No narcotic use.  BMs have returned.  Not using Miralax any more.  Looser BMs in Ciprofloxacin.  (Not diarrhea.) Little vaginal bleeding.   Pathology report:  Benign tubes and ovaries.   Lives in [place] area.   GYNECOLOGIC HISTORY: Patient's last menstrual period was 02/20/1986 (approximate). Contraception:  Hysterectomy  Menopausal hormone therapy:  none Last mammogram:  02/08/16 BIRADS1:neg  Last pap smear:   11/2006 Neg         OB History    Gravida Para Term Preterm AB Living   2 2 2  0 0 2   SAB TAB Ectopic Multiple Live Births   0 0 0 0 2         Patient Active Problem List   Diagnosis Date Noted  . Status post surgery 01/01/2017  . S/P laparoscopic appendectomyOct 2017 12/10/2015  . Acute appendicitis 12/10/2015  . Overweight (BMI 25.0-29.9) 12/01/2015  . S/P left TKA 11/30/2015  . Migraine, unspecified, without mention of intractable migraine without mention of status migrainosus 09/02/2013  . History of endometriosis 09/02/2013    Past Medical History:  Diagnosis Date  . Anemia   . Arthritis    left knee  . Family history of adverse reaction to anesthesia    pt daughter very nauseous with anesthesia.  . H/O seasonal allergies    uses OTC med  . History of anemia    no problems now since Hysterectomy  .  History of endometriosis   . Migraine    Nadolol was use to help-no longer a problem.  . Osteopenia   . Pneumonia    36 years ago  . Recurrent sinus infections    last Spring 2017   . Vaginal prolapse    "wears pessary"    Past Surgical History:  Procedure Laterality Date  . APPENDECTOMY    . COLONOSCOPY    . LAPAROSCOPIC APPENDECTOMY N/A 12/10/2015   Procedure: APPENDECTOMY LAPAROSCOPIC;  Surgeon: Luretha Murphy, MD;  Location: [facility];  Service: General;  Laterality: N/A;  . NASAL SINUS SURGERY  2002   again in 2002, revision  . TOTAL ABDOMINAL HYSTERECTOMY  1998   DUB/endometriosis  . TOTAL KNEE ARTHROPLASTY Left 11/30/2015   Procedure: LEFT TOTAL KNEE ARTHROPLASTY;  Surgeon: Durene Romans, MD;  Location: [facility];  Service: Orthopedics;  Laterality: Left;  Adductor Block  . WISDOM TOOTH EXTRACTION      Current Outpatient Medications  Medication Sig Dispense Refill  . ciprofloxacin (CIPRO) 250 MG tablet Take one tablet (250 mg) by mouth twice a day while your catheter is in. 14 tablet 0  . citalopram (CELEXA) 20 MG tablet Take 1 tablet (20 mg total) by mouth daily. (Patient taking differently: Take 20 mg by mouth every evening. ) 90 tablet 4  . clobetasol (TEMOVATE) 0.05 % external solution Apply 1 application topically 2 (two) times daily as needed (APPLY  TO SCALP 1 TO 2 TIMES A DAY FOR FLARES).    . fluticasone (FLONASE) 50 MCG/ACT nasal spray Place 1-2 sprays into both nostrils daily as needed (for nasal congestion.).     Marland Kitchen. gabapentin (NEURONTIN) 300 MG capsule Take 1 capsule (300 mg total) by mouth at bedtime. 90 capsule 4  . ibuprofen (ADVIL,MOTRIN) 800 MG tablet Take 1 tablet (800 mg total) by mouth every 8 (eight) hours as needed. 30 tablet 0  . loratadine (CLARITIN) 10 MG tablet Take 10 mg by mouth daily.    . nadolol (CORGARD) 20 MG tablet TAKE 1 TABLET (20 MG TOTAL) BY MOUTH DAILY. (Patient taking differently: TAKE 1 TABLET (20 MG TOTAL) BY MOUTH DAILY IN THE EVENING) 90  tablet 4  . SUMAtriptan (IMITREX) 100 MG tablet Take 1 tablet (100 mg total) by mouth every 2 (two) hours as needed for migraine. 9 tablet 4   No current facility-administered medications for this visit.      ALLERGIES: Novocain [procaine]  Family History  Problem Relation Age of Onset  . Hypertension Maternal Grandfather   . Heart attack Father   . Miscarriages / Stillbirths Sister   . Transient ischemic attack Maternal Grandmother   . Osteoarthritis Mother     Social History   Socioeconomic History  . Marital status: Married    Spouse name: Not on file  . Number of children: Not on file  . Years of education: Not on file  . Highest education level: Not on file  Social Needs  . Financial resource strain: Not on file  . Food insecurity - worry: Not on file  . Food insecurity - inability: Not on file  . Transportation needs - medical: Not on file  . Transportation needs - non-medical: Not on file  Occupational History  . Not on file  Tobacco Use  . Smoking status: Never Smoker  . Smokeless tobacco: Never Used  Substance and Sexual Activity  . Alcohol use: Yes    Alcohol/week: 3.0 oz    Types: 5 Standard drinks or equivalent per week    Comment: wine  . Drug use: No  . Sexual activity: Yes    Partners: Male    Birth control/protection: Surgical    Comment: TAH  Other Topics Concern  . Not on file  Social History Narrative  . Not on file    ROS:  Pertinent items are noted in HPI.  PHYSICAL EXAMINATION:    BP 126/68 (BP Location: Right Arm, Patient Position: Sitting, Cuff Size: Normal)   Pulse 68   Resp 14   Ht 5\' 6"  (1.676 m)   Wt 156 lb (70.8 kg)   LMP 02/20/1986 (Approximate)   BMI 25.18 kg/m     General appearance: alert, cooperative and appears stated age   Abdomen: incisions intact, soft, non-tender, no masses,  no organomegaly.  Pelvic: External genitalia:  SP incisions intact.               Urethra:  normal appearing urethra with no masses,  tenderness or lesions              Bartholins and Skenes: normal                 Vagina: normal appearing vagina with normal color and discharge, no lesions              Cervix: absent.                 Bimanual  Exam:  Uterus:   absent              Adnexa: no mass, fullness, tenderness              Retrograde filling of Foley catheter with 160 cc sterile normal saline, and then removal of Foley.  Patient was able to void 205 cc spontaneously.  Chaperone was present for exam.  ASSESSMENT  Urinary retention resolved.  PLAN  She will start a plan of voiding 3 hours.  Continue no lifting for 3 months.  I encouraged ambulation.  Follow up for 6 week post op visit.  She will call if she is not voiding well.   An After Visit Summary was printed and given to the patient.

## 2017-01-10 NOTE — Discharge Summary (Signed)
Physician Discharge Summary  Patient ID: Vicki Turner MRN: [id number] DOB/AGE: 05-09-1950 66 y.o.  Admit date: 01/01/2017 Discharge date:  01/02/17  Admission Diagnoses: 1.  Posthysterectomy vaginal vault prolapse 2.  Cystocele 3.  Rectocele 4.  Genuine stress incontinence.   Discharge Diagnoses:  1.  Posthysterectomy vaginal vault prolapse 2.  Cystocele 3.  Rectocele 4.  Genuine stress incontinence 5.  Status post robotically assisted bilateral salpingo-oophorectomy with laparoscopic sacral colpopexy, anterior and posterior colporrhaphy, TVT Exact midurethral sling and cystoscopy. 6.  Urinary retention postop    Active Problems:   Status post surgery   Discharged Condition: good  Hospital Course:  The patient was admitted on 01/01/17 for a robotically assisted bilateral salpingo-oophorectomy with laparoscopic sacral colpopexy, anterior and posterior colporrhaphy, TVT Exact midurethral sling and cystoscopy which were performed without complication while under general anesthesia. Dr. Leda QuailSuzanne Miller performed the bilateral salpingo-oophorectomy and Dr. Edward JollySilva performed the prolapse repair and midurethral sling.  The patient's post op course was uneventful.  She received morphine and Toradol for pain control initially, and this was converted over to Percocet and Motrin when the patient began taking po well.  She ambulated independently and wore PAS and Ted hose for DVT prophylaxis while in bed.  Her foley catheter were removed on post op day one, and she was unable to void well.  She voided only a small amount which could not be measured as she did not void into the measuring hat for the commode.  She was sent home with a foley catheter and instructions for care for this. The patient's vitall signs remained stable and she demonstrated no signs of infection during her hospitalization.  The patient's post op day one Hgb was 10.1.   She was tolerating the this well.  She had very minimal  vaginal bleeding, and her incision(s) demonstrated no signs of erythema or significant drainage.  She was found to be in good condition and ready for discharge on post op day one.  She will take Ciprofloxacin 250 mg po bid post op for infection prophylaxis while her foley catheter is in place.    Consults: None  Significant Diagnostic Studies: labs:  See Hospital course.  Treatments: surgery:  robotically assisted bilateral salpingo-oophorectomy with laparoscopic sacral colpopexy, anterior and posterior colporrhaphy, TVT Exact midurethral sling and cystoscopy.  Discharge Exam: Blood pressure (!) 112/58, pulse 66, temperature 98.9 F (37.2 C), temperature source Oral, resp. rate 18, height 5' 5.51" (1.664 m), weight 156 lb (70.8 kg), last menstrual period 02/20/1986, SpO2 97 %. General: alert and cooperative Resp: clear to auscultation bilaterally Cardio: regular rate and rhythm, S1, S2 normal, no murmur, click, rub or gallop GI: soft, non-tender; bowel sounds normal; no masses,  no organomegaly and incision: clean, dry, intact and mild circular ecchymoses around laparoscopic incisions.  Vaginal Bleeding: minimal   Disposition: 01-Home or Self Care Discharge instructions were reviewed in verbal and written form.   Allergies as of 01/02/2017      Reactions   Novocain [procaine] Hives, Other (See Comments)   Jittery feeling. States she can take Lidocaine.      Medication List    STOP taking these medications   conjugated estrogens vaginal cream Commonly known as:  PREMARIN     TAKE these medications   ciprofloxacin 250 MG tablet Commonly known as:  CIPRO Take one tablet (250 mg) by mouth twice a day while your catheter is in.   citalopram 20 MG tablet Commonly known as:  CELEXA Take  1 tablet (20 mg total) by mouth daily. What changed:  when to take this   clobetasol 0.05 % external solution Commonly known as:  TEMOVATE Apply 1 application topically 2 (two) times daily as  needed (APPLY TO SCALP 1 TO 2 TIMES A DAY FOR FLARES).   fluticasone 50 MCG/ACT nasal spray Commonly known as:  FLONASE Place 1-2 sprays into both nostrils daily as needed (for nasal congestion.).   gabapentin 300 MG capsule Commonly known as:  NEURONTIN Take 1 capsule (300 mg total) by mouth at bedtime.   ibuprofen 800 MG tablet Commonly known as:  ADVIL,MOTRIN Take 1 tablet (800 mg total) by mouth every 8 (eight) hours as needed.   loratadine 10 MG tablet Commonly known as:  CLARITIN Take 10 mg by mouth daily.   nadolol 20 MG tablet Commonly known as:  CORGARD TAKE 1 TABLET (20 MG TOTAL) BY MOUTH DAILY. What changed:  See the new instructions.   SUMAtriptan 100 MG tablet Commonly known as:  IMITREX Take 1 tablet (100 mg total) by mouth every 2 (two) hours as needed for migraine.     The patient will be seen for a post op visit and foley catheter removal in one week.  Follow-up Information    Patton SallesAmundson C Silva, Keerthi Hazell E, MD.   Specialty:  Obstetrics and Gynecology Contact information: [address] [place] [address] [phone]           Signed: Melony OverlyBrook A Silva 01/10/2017, 5:23 AM

## 2017-01-15 ENCOUNTER — Ambulatory Visit: Payer: Self-pay | Admitting: Obstetrics and Gynecology

## 2017-01-23 ENCOUNTER — Encounter: Payer: Self-pay | Admitting: Obstetrics and Gynecology

## 2017-01-24 ENCOUNTER — Other Ambulatory Visit: Payer: Self-pay | Admitting: Obstetrics & Gynecology

## 2017-01-24 DIAGNOSIS — Z1231 Encounter for screening mammogram for malignant neoplasm of breast: Secondary | ICD-10-CM

## 2017-01-26 ENCOUNTER — Encounter (HOSPITAL_COMMUNITY): Payer: Self-pay | Admitting: Obstetrics and Gynecology

## 2017-01-26 ENCOUNTER — Other Ambulatory Visit: Payer: Self-pay | Admitting: *Deleted

## 2017-01-26 MED ORDER — NADOLOL 20 MG PO TABS: ORAL_TABLET | ORAL | 0 refills | Status: DC

## 2017-01-26 NOTE — Telephone Encounter (Signed)
Medication refill request: Nadolol Last AEX:  12-15-16  Next AEX: 1-16=20  Last MMG (if hormonal medication request): 02-08-16 WNL  Refill authorized: please advise

## 2017-02-08 ENCOUNTER — Telehealth: Payer: Self-pay

## 2017-02-08 NOTE — Telephone Encounter (Signed)
Called patient to reschedule 6 week post op appointment on 02-16-17, lmovm to return my call.

## 2017-02-09 NOTE — Telephone Encounter (Signed)
Spoke with patient and rescheduled 6 week post op with Dr.Miller on 02-19-17 at 4:00pm.

## 2017-02-16 ENCOUNTER — Ambulatory Visit: Payer: Self-pay | Admitting: Obstetrics and Gynecology

## 2017-02-19 ENCOUNTER — Other Ambulatory Visit: Payer: Self-pay

## 2017-02-19 ENCOUNTER — Ambulatory Visit (INDEPENDENT_AMBULATORY_CARE_PROVIDER_SITE_OTHER): Payer: Medicare Other | Admitting: Obstetrics & Gynecology

## 2017-02-19 ENCOUNTER — Encounter: Payer: Self-pay | Admitting: Obstetrics & Gynecology

## 2017-02-19 VITALS — BP 130/76 | HR 72 | Resp 12 | Wt 159.1 lb

## 2017-02-19 DIAGNOSIS — Z9889 Other specified postprocedural states: Secondary | ICD-10-CM

## 2017-02-19 NOTE — Progress Notes (Deleted)
Post Operative Visit  Procedure:*** Days Post-op: ***  Subjective: ***  Objective: LMP 02/20/1986 (Approximate)   EXAM General: {Exam; general:16600} Resp: {Exam; lung:16931} Cardio: {Exam; heart:5510} GI: {Exam, ZO:[id number]}GI:3041136} Extremities: {Exam; extremity:5109} Vaginal Bleeding: {exam; vaginal bleeding:3041122}  Assessment: s/p ***  Plan: Recheck {NUMBER 1-10:22536} weeks ***

## 2017-02-19 NOTE — Progress Notes (Signed)
Post Operative Visit  Procedure:ROBOTIC ASSISTED LAPAROSCOPIC SACROCOLPOPEXY (N/A Abdomen) ANTERIOR (CYSTOCELE) AND POSTERIOR REPAIR (RECTOCELE) (N/A ) TRANSVAGINAL TAPE (TVT) PROCEDURE mid-urethral sling (N/A Vagina ) CYSTOSCOPY (N/A Bladder) ROBOTIC ASSISTED BILATERAL SALPINGO OOPHORECTOMY, Extensive Lysis of Adhesions (Abdomen)  Days Post-op: 41 days   Subjective: Doing really well.  Driving.  Normal bowel movements.  So happy with bladder function, control.  Didn't realize how bad her bladder control was.  Energy is good.  Having to make sure she doesn't do too much.  Of pain medication for weeks.  Having scant intermittent spotting, vaginal discharge.  Not heavy.  No fevers.  Objective: BP 130/76 (BP Location: Right Arm, Patient Position: Sitting, Cuff Size: Normal)   Pulse 72   Resp 12   Wt 159 lb 1.6 oz (72.2 kg)   LMP 02/20/1986 (Approximate)   BMI 25.68 kg/m   EXAM General: alert and cooperative Resp: clear to auscultation bilaterally Cardio: regular rate and rhythm, S1, S2 normal, no murmur, click, rub or gallop GI: soft, non-tender; bowel sounds normal; no masses,  no organomegaly and incision: clean, dry and intact Extremities: extremities normal, atraumatic, no cyanosis or edema Vaginal Bleeding: none  Gyn:  NAEFG, vagina without lesions, no masses, one suture noted along cystocele repair still, no masses or firmness or adhesions noted on BME.    Assessment: s/p Robotic assisted laparoscopic sacrocolpopexy, A&P repair, TVT, Cystoscopy, BSO with extensive LOA.  Plan: Recheck 5 weeks with Dr. Edward JollySilva Reviewed restrictions.  She is going to start some light cardio.  Weight limit for lifting reviewed.  Will wait to be SA until next visit with Dr. Edward JollySilva.

## 2017-02-23 ENCOUNTER — Ambulatory Visit
Admission: RE | Admit: 2017-02-23 | Discharge: 2017-02-23 | Disposition: A | Discharge status: Home / Self Care | Payer: Medicare Other | Source: Ambulatory Visit | Attending: Obstetrics & Gynecology | Admitting: Obstetrics & Gynecology

## 2017-02-23 DIAGNOSIS — Z1231 Encounter for screening mammogram for malignant neoplasm of breast: Secondary | ICD-10-CM

## 2017-03-26 ENCOUNTER — Encounter: Payer: Self-pay | Admitting: Obstetrics and Gynecology

## 2017-03-26 ENCOUNTER — Other Ambulatory Visit: Payer: Self-pay

## 2017-03-26 ENCOUNTER — Ambulatory Visit (INDEPENDENT_AMBULATORY_CARE_PROVIDER_SITE_OTHER): Payer: Medicare Other | Admitting: Obstetrics and Gynecology

## 2017-03-26 VITALS — BP 120/78 | HR 68 | Resp 16 | Wt 154.0 lb

## 2017-03-26 DIAGNOSIS — D649 Anemia, unspecified: Secondary | ICD-10-CM

## 2017-03-26 DIAGNOSIS — Z9889 Other specified postprocedural states: Secondary | ICD-10-CM

## 2017-03-26 MED ORDER — ESTROGENS, CONJUGATED 0.625 MG/GM VA CREA: TOPICAL_CREAM | VAGINAL | 2 refills | Status: DC

## 2017-03-26 NOTE — Progress Notes (Signed)
GYNECOLOGY  VISIT   HPI: 67 y.o.   Married  Caucasian  female   G2P2002 with Patient's last menstrual period was 02/20/1986 (approximate).   here for   12 week post op  ROBOTIC ASSISTED LAPAROSCOPIC SACROCOLPOPEXY (N/A Abdomen) ANTERIOR (CYSTOCELE) AND POSTERIOR REPAIR (RECTOCELE) (N/A ) TRANSVAGINAL TAPE (TVT) PROCEDURE mid-urethral sling (N/A Vagina ) CYSTOSCOPY (N/A Bladder) ROBOTIC ASSISTED BILATERAL SALPINGO OOPHORECTOMY, Extensive Lysis of Adhesions (Abdomen)  " I feel like I have my life back.  I am controlling my bladder instead of my bladder controlling me.  Voiding well.  Normal bowel function.   Not currently using vaginal estrogen cream, Premarin.  GYNECOLOGIC HISTORY: Patient's last menstrual period was 02/20/1986 (approximate). Contraception:  Hysterectomy Menopausal hormone therapy:  none Last mammogram:  02/23/17 BIRADS 1 negative/density c Last pap smear:   11/2006 Neg         OB History    Gravida Para Term Preterm AB Living   2 2 2  0 0 2   SAB TAB Ectopic Multiple Live Births   0 0 0 0 2         Patient Active Problem List   Diagnosis Date Noted  . Status post surgery 01/01/2017  . S/P laparoscopic appendectomyOct 2017 12/10/2015  . Acute appendicitis 12/10/2015  . Overweight (BMI 25.0-29.9) 12/01/2015  . S/P left TKA 11/30/2015  . Migraine, unspecified, without mention of intractable migraine without mention of status migrainosus 09/02/2013  . History of endometriosis 09/02/2013    Past Medical History:  Diagnosis Date  . Anemia   . Arthritis    left knee  . Family history of adverse reaction to anesthesia    pt daughter very nauseous with anesthesia.  . H/O seasonal allergies    uses OTC med  . History of anemia    no problems now since Hysterectomy  . History of endometriosis   . Migraine    Nadolol was use to help-no longer a problem.  . Osteopenia   . Pneumonia    36 years ago  . Recurrent sinus infections    last Spring 2017   .  Vaginal prolapse    "wears pessary"    Past Surgical History:  Procedure Laterality Date  . ANTERIOR AND POSTERIOR REPAIR N/A 01/01/2017   Procedure: ANTERIOR (CYSTOCELE) AND POSTERIOR REPAIR (RECTOCELE);  Surgeon: Patton Salles, MD;  Location: [facility];  Service: Gynecology;  Laterality: N/A;  . APPENDECTOMY    . BLADDER SUSPENSION N/A 01/01/2017   Procedure: TRANSVAGINAL TAPE (TVT) PROCEDURE mid-urethral sling;  Surgeon: Patton Salles, MD;  Location: [facility];  Service: Gynecology;  Laterality: N/A;  . COLONOSCOPY    . CYSTOSCOPY N/A 01/01/2017   Procedure: CYSTOSCOPY;  Surgeon: Patton Salles, MD;  Location: [facility];  Service: Gynecology;  Laterality: N/A;  . LAPAROSCOPIC APPENDECTOMY N/A 12/10/2015   Procedure: APPENDECTOMY LAPAROSCOPIC;  Surgeon: Luretha Murphy, MD;  Location: [facility];  Service: General;  Laterality: N/A;  . NASAL SINUS SURGERY  2002   again in 2002, revision  . ROBOTIC ASSISTED BILATERAL SALPINGO OOPHERECTOMY  01/01/2017   Procedure: ROBOTIC ASSISTED BILATERAL SALPINGO OOPHORECTOMY, Extensive Lysis of Adhesions;  Surgeon: Patton Salles, MD;  Location: [facility];  Service: Gynecology;;  . ROBOTIC ASSISTED LAPAROSCOPIC SACROCOLPOPEXY N/A 01/01/2017   Procedure: ROBOTIC ASSISTED LAPAROSCOPIC SACROCOLPOPEXY;  Surgeon: Patton Salles, MD;  Location: [facility];  Service: Gynecology;  Laterality: N/A;  . TOTAL ABDOMINAL HYSTERECTOMY  1998   DUB/endometriosis  . TOTAL KNEE ARTHROPLASTY Left 11/30/2015   Procedure: LEFT TOTAL KNEE ARTHROPLASTY;  Surgeon: Durene RomansMatthew Olin, MD;  Location: [facility];  Service: Orthopedics;  Laterality: Left;  Adductor Block  . WISDOM TOOTH EXTRACTION      Current Outpatient Medications  Medication Sig Dispense Refill  . citalopram (CELEXA) 20 MG tablet Take 1 tablet (20 mg total) by mouth daily. (Patient taking differently: Take 20 mg by mouth every evening. ) 90 tablet 4  . clobetasol (TEMOVATE) 0.05  % external solution Apply 1 application topically 2 (two) times daily as needed (APPLY TO SCALP 1 TO 2 TIMES A DAY FOR FLARES).    . fluticasone (FLONASE) 50 MCG/ACT nasal spray Place 1-2 sprays into both nostrils daily as needed (for nasal congestion.).     Marland Kitchen. gabapentin (NEURONTIN) 300 MG capsule Take 1 capsule (300 mg total) by mouth at bedtime. 90 capsule 4  . ibuprofen (ADVIL,MOTRIN) 800 MG tablet Take 1 tablet (800 mg total) by mouth every 8 (eight) hours as needed. 30 tablet 0  . loratadine (CLARITIN) 10 MG tablet Take 10 mg by mouth daily.    . nadolol (CORGARD) 20 MG tablet TAKE 1 TABLET (20 MG TOTAL) BY MOUTH DAILY. 90 tablet 0  . SUMAtriptan (IMITREX) 100 MG tablet Take 1 tablet (100 mg total) by mouth every 2 (two) hours as needed for migraine. 9 tablet 4   No current facility-administered medications for this visit.      ALLERGIES: Novocain [procaine]  Family History  Problem Relation Age of Onset  . Hypertension Maternal Grandfather   . Heart attack Father   . Miscarriages / Stillbirths Sister   . Transient ischemic attack Maternal Grandmother   . Osteoarthritis Mother     Social History   Socioeconomic History  . Marital status: Married    Spouse name: Not on file  . Number of children: Not on file  . Years of education: Not on file  . Highest education level: Not on file  Social Needs  . Financial resource strain: Not on file  . Food insecurity - worry: Not on file  . Food insecurity - inability: Not on file  . Transportation needs - medical: Not on file  . Transportation needs - non-medical: Not on file  Occupational History  . Not on file  Tobacco Use  . Smoking status: Never Smoker  . Smokeless tobacco: Never Used  Substance and Sexual Activity  . Alcohol use: Yes    Alcohol/week: 3.0 oz    Types: 5 Standard drinks or equivalent per week    Comment: wine  . Drug use: No  . Sexual activity: Yes    Partners: Male    Birth control/protection: Surgical     Comment: TAH  Other Topics Concern  . Not on file  Social History Narrative  . Not on file    ROS:  Pertinent items are noted in HPI.  PHYSICAL EXAMINATION:    BP 120/78 (BP Location: Right Arm, Patient Position: Sitting, Cuff Size: Normal)   Pulse 68   Resp 16   Wt 154 lb (69.9 kg)   LMP 02/20/1986 (Approximate)   BMI 24.86 kg/m     General appearance: alert, cooperative and appears stated age   Abdomen: incisions intact, soft, non-tender, no masses,  no organomegaly   Pelvic: External genitalia:  no lesions              Urethra:  normal appearing urethra with no  masses, tenderness or lesions              Bartholins and Skenes: normal                 Vagina:  7 mm pedunculated granulation of anterior vagina tx with AgNO3 and then removed with ring forceps and discarded.  Slightly tight introitus.  2+ finger breath width. Patient aware.              Cervix:  Absent.                Bimanual Exam:  Uterus:   Absent.               Adnexa: no mass, fullness, tenderness                Chaperone was present for exam.  ASSESSMENT  Status post robotic sacrocolpopexy with anterior and posterior colporrhaphy, TVT/cysto.  Doing well. Anemia post op.  PLAN  Resume normal physical activity in one week, go gradually.  Restart Premarin vaginal cream 1/2 gm pv at hs x 2 weeks and then twice weekly.  CBC.  Ok to resume sexual activity in 2 weeks.  We talked about potential dilator use.  Return in 1 month.    An After Visit Summary was printed and given to the patient.

## 2017-03-27 LAB — CBC
Hematocrit: 37.8 % (ref 34.0–46.6)
Hemoglobin: 12.6 g/dL (ref 11.1–15.9)
MCH: 31 pg (ref 26.6–33.0)
MCHC: 33.3 g/dL (ref 31.5–35.7)
MCV: 93 fL (ref 79–97)
PLATELETS: 383 10*3/uL — AB (ref 150–379)
RBC: 4.07 x10E6/uL (ref 3.77–5.28)
RDW: 12.4 % (ref 12.3–15.4)
WBC: 6.1 10*3/uL (ref 3.4–10.8)

## 2017-04-24 ENCOUNTER — Other Ambulatory Visit: Payer: Self-pay | Admitting: Obstetrics & Gynecology

## 2017-04-24 NOTE — Telephone Encounter (Signed)
Medication refill request: Corgard  Last AEX:  12-15-16  Next AEX: 03-07-18  Last MMG (if hormonal medication request): 02-23-17 WNL  Refill authorized: please advise

## 2017-04-24 NOTE — Telephone Encounter (Signed)
I did this refill in December because she was here for a post op visit but this usually comes from Dr. Clarene DukeLittle.  Can you please call the pharmacy and ask them to send the refill request to Dr. Clarene DukeLittle.

## 2017-04-24 NOTE — Telephone Encounter (Signed)
Spoke with Pharmacy and advised to send RX to Dr. Clarene DukeLittle -eh

## 2017-04-25 ENCOUNTER — Encounter: Payer: Self-pay | Admitting: Obstetrics and Gynecology

## 2017-04-25 ENCOUNTER — Ambulatory Visit: Payer: Medicare Other | Admitting: Obstetrics and Gynecology

## 2017-04-25 ENCOUNTER — Other Ambulatory Visit: Payer: Self-pay

## 2017-04-25 VITALS — BP 130/78 | HR 64 | Resp 14 | Wt 154.0 lb

## 2017-04-25 DIAGNOSIS — N952 Postmenopausal atrophic vaginitis: Secondary | ICD-10-CM | POA: Diagnosis not present

## 2017-04-25 DIAGNOSIS — N898 Other specified noninflammatory disorders of vagina: Secondary | ICD-10-CM

## 2017-04-25 NOTE — Patient Instructions (Signed)
Use a little of the Premarin cream at the hymenal opening!

## 2017-04-25 NOTE — Progress Notes (Signed)
GYNECOLOGY  VISIT   HPI: 67 y.o.   Married  Caucasian  female   G2P2002 with Patient's last menstrual period was 02/20/1986 (approximate).   here for 4 week follow-up.  Started vaginal estrogen at her last post op visit 03/26/17.  Using Premarin for the last month.  Using twice a week now.   Attempted intercourse and this was a little painful.  Felt some discomfort with initial penetration.  This is getting better now.   Due for her colonoscopy at the end of the year last year.   GYNECOLOGIC HISTORY: Patient's last menstrual period was 02/20/1986 (approximate). Contraception:  Hysterectomy Menopausal hormone therapy:  Premarin Last mammogram:  02/23/17 BI RADS 1 negative/density c Last pap smear:   11/2006 Negative        OB History    Gravida Para Term Preterm AB Living   2 2 2  0 0 2   SAB TAB Ectopic Multiple Live Births   0 0 0 0 2         Patient Active Problem List   Diagnosis Date Noted  . Status post surgery 01/01/2017  . S/P laparoscopic appendectomyOct 2017 12/10/2015  . Acute appendicitis 12/10/2015  . Overweight (BMI 25.0-29.9) 12/01/2015  . S/P left TKA 11/30/2015  . Migraine, unspecified, without mention of intractable migraine without mention of status migrainosus 09/02/2013  . History of endometriosis 09/02/2013    Past Medical History:  Diagnosis Date  . Anemia   . Arthritis    left knee  . Family history of adverse reaction to anesthesia    pt daughter very nauseous with anesthesia.  . H/O seasonal allergies    uses OTC med  . History of anemia    no problems now since Hysterectomy  . History of endometriosis   . Migraine    Nadolol was use to help-no longer a problem.  . Osteopenia   . Pneumonia    36 years ago  . Recurrent sinus infections    last Spring 2017   . Vaginal prolapse    "wears pessary"    Past Surgical History:  Procedure Laterality Date  . ANTERIOR AND POSTERIOR REPAIR N/A 01/01/2017   Procedure: ANTERIOR (CYSTOCELE)  AND POSTERIOR REPAIR (RECTOCELE);  Surgeon: Patton SallesAmundson C Silva, Lorina Duffner E, MD;  Location: [facility];  Service: Gynecology;  Laterality: N/A;  . APPENDECTOMY    . BLADDER SUSPENSION N/A 01/01/2017   Procedure: TRANSVAGINAL TAPE (TVT) PROCEDURE mid-urethral sling;  Surgeon: Patton SallesAmundson C Silva, Vennesa Bastedo E, MD;  Location: [facility];  Service: Gynecology;  Laterality: N/A;  . COLONOSCOPY    . CYSTOSCOPY N/A 01/01/2017   Procedure: CYSTOSCOPY;  Surgeon: Patton SallesAmundson C Silva, Kylie Simmonds E, MD;  Location: [facility];  Service: Gynecology;  Laterality: N/A;  . LAPAROSCOPIC APPENDECTOMY N/A 12/10/2015   Procedure: APPENDECTOMY LAPAROSCOPIC;  Surgeon: Luretha MurphyMatthew Martin, MD;  Location: [facility];  Service: General;  Laterality: N/A;  . NASAL SINUS SURGERY  2002   again in 2002, revision  . ROBOTIC ASSISTED BILATERAL SALPINGO OOPHERECTOMY  01/01/2017   Procedure: ROBOTIC ASSISTED BILATERAL SALPINGO OOPHORECTOMY, Extensive Lysis of Adhesions;  Surgeon: Patton SallesAmundson C Silva, Zae Kirtz E, MD;  Location: [facility];  Service: Gynecology;;  . ROBOTIC ASSISTED LAPAROSCOPIC SACROCOLPOPEXY N/A 01/01/2017   Procedure: ROBOTIC ASSISTED LAPAROSCOPIC SACROCOLPOPEXY;  Surgeon: Patton SallesAmundson C Silva, Donetta Isaza E, MD;  Location: [facility];  Service: Gynecology;  Laterality: N/A;  . TOTAL ABDOMINAL HYSTERECTOMY  1998   DUB/endometriosis  . TOTAL KNEE ARTHROPLASTY Left 11/30/2015   Procedure: LEFT  TOTAL KNEE ARTHROPLASTY;  Surgeon: Durene Romans, MD;  Location: [facility];  Service: Orthopedics;  Laterality: Left;  Adductor Block  . WISDOM TOOTH EXTRACTION      Current Outpatient Medications  Medication Sig Dispense Refill  . citalopram (CELEXA) 20 MG tablet Take 1 tablet (20 mg total) by mouth daily. (Patient taking differently: Take 20 mg by mouth every evening. ) 90 tablet 4  . clobetasol (TEMOVATE) 0.05 % external solution Apply 1 application topically 2 (two) times daily as needed (APPLY TO SCALP 1 TO 2 TIMES A DAY FOR FLARES).    Marland Kitchen conjugated estrogens (PREMARIN) vaginal  cream Use 1/2 g vaginally every night at bed time for the first 2 weeks, then use 1/2 g vaginally two times per week. 30 g 2  . fluticasone (FLONASE) 50 MCG/ACT nasal spray Place 1-2 sprays into both nostrils daily as needed (for nasal congestion.).     Marland Kitchen gabapentin (NEURONTIN) 300 MG capsule Take 1 capsule (300 mg total) by mouth at bedtime. 90 capsule 4  . ibuprofen (ADVIL,MOTRIN) 800 MG tablet Take 1 tablet (800 mg total) by mouth every 8 (eight) hours as needed. 30 tablet 0  . loratadine (CLARITIN) 10 MG tablet Take 10 mg by mouth daily.    . nadolol (CORGARD) 20 MG tablet TAKE 1 TABLET (20 MG TOTAL) BY MOUTH DAILY. 90 tablet 0  . SUMAtriptan (IMITREX) 100 MG tablet Take 1 tablet (100 mg total) by mouth every 2 (two) hours as needed for migraine. 9 tablet 4   No current facility-administered medications for this visit.      ALLERGIES: Novocain [procaine]  Family History  Problem Relation Age of Onset  . Hypertension Maternal Grandfather   . Heart attack Father   . Miscarriages / Stillbirths Sister   . Transient ischemic attack Maternal Grandmother   . Osteoarthritis Mother     Social History   Socioeconomic History  . Marital status: Married    Spouse name: Not on file  . Number of children: Not on file  . Years of education: Not on file  . Highest education level: Not on file  Social Needs  . Financial resource strain: Not on file  . Food insecurity - worry: Not on file  . Food insecurity - inability: Not on file  . Transportation needs - medical: Not on file  . Transportation needs - non-medical: Not on file  Occupational History  . Not on file  Tobacco Use  . Smoking status: Never Smoker  . Smokeless tobacco: Never Used  Substance and Sexual Activity  . Alcohol use: Yes    Alcohol/week: 3.0 oz    Types: 5 Standard drinks or equivalent per week    Comment: wine  . Drug use: No  . Sexual activity: Yes    Partners: Male    Birth control/protection: Surgical     Comment: TAH  Other Topics Concern  . Not on file  Social History Narrative  . Not on file    ROS:  Pertinent items are noted in HPI.  PHYSICAL EXAMINATION:    BP 130/78 (BP Location: Right Arm, Patient Position: Sitting, Cuff Size: Normal)   Pulse 64   Resp 14   Wt 154 lb (69.9 kg)   LMP 02/20/1986 (Approximate)   BMI 24.86 kg/m     General appearance: alert, cooperative and appears stated age   Pelvic: External genitalia:  no lesions              Urethra:  normal appearing urethra with no masses, tenderness or lesions              Bartholins and Skenes: normal                 Vagina: normal appearing vagina with normal color and discharge, no lesions.  3 mm area of granulation tissue of the posterior vaginal wall close to hymen - tx with AgNO3.               Cervix:  Absent.                 Bimanual Exam:  Uterus:   absent              Adnexa: no mass, fullness, tenderness              Chaperone was present for exam.  ASSESSMENT  Vaginal atrophy.  Vaginal granulation tissue.  PLAN  Patient re-instructed in Premarin use.  Use some toward the hymenal opening twice weekly.  Has refills.  May resume all normal activity.  Next mammogram in Jan. 2020. Annual exam with Dr. Hyacinth Meeker in Jan. 2020.  Ok for colonoscopy.   An After Visit Summary was printed and given to the patient.  __15____ minutes face to face time of which over 50% was spent in counseling.

## 2017-12-21 ENCOUNTER — Other Ambulatory Visit: Payer: Self-pay | Admitting: Obstetrics & Gynecology

## 2017-12-21 NOTE — Telephone Encounter (Signed)
Medication refill request: Celexa  Last AEX:12/15/16   Next AEX: 03/07/18 Last MMG (if hormonal medication request): 02/23/17 Bi-rads 1 Neg  Refill authorized: #[id number] with 0RF Please refill until her AEX if appropriate.

## 2018-01-24 ENCOUNTER — Other Ambulatory Visit: Payer: Self-pay | Admitting: Obstetrics & Gynecology

## 2018-01-24 ENCOUNTER — Telehealth: Payer: Self-pay | Admitting: Obstetrics & Gynecology

## 2018-01-24 DIAGNOSIS — Z1231 Encounter for screening mammogram for malignant neoplasm of breast: Secondary | ICD-10-CM

## 2018-01-24 DIAGNOSIS — E2839 Other primary ovarian failure: Secondary | ICD-10-CM

## 2018-01-24 NOTE — Telephone Encounter (Signed)
Bone Density order pended for review and signature by Dr. Hyacinth MeekerMiller.  Routing to provider for review.

## 2018-01-24 NOTE — Telephone Encounter (Signed)
Patient is [facility] and would like an order for her BMD. She asked if someone could call her when this has been faxed?

## 2018-03-07 ENCOUNTER — Other Ambulatory Visit: Payer: Self-pay

## 2018-03-07 ENCOUNTER — Encounter

## 2018-03-07 ENCOUNTER — Ambulatory Visit (INDEPENDENT_AMBULATORY_CARE_PROVIDER_SITE_OTHER): Payer: Medicare Other | Admitting: Obstetrics & Gynecology

## 2018-03-07 ENCOUNTER — Encounter: Payer: Self-pay | Admitting: Obstetrics & Gynecology

## 2018-03-07 VITALS — BP 128/78 | HR 68 | Resp 16 | Ht 65.5 in | Wt 157.4 lb

## 2018-03-07 DIAGNOSIS — Z01419 Encounter for gynecological examination (general) (routine) without abnormal findings: Secondary | ICD-10-CM | POA: Diagnosis not present

## 2018-03-07 MED ORDER — CITALOPRAM HYDROBROMIDE 20 MG PO TABS: 20.0000 mg | ORAL_TABLET | Freq: Every day | ORAL | 4 refills | Status: DC

## 2018-03-07 MED ORDER — GABAPENTIN 300 MG PO CAPS: 300.0000 mg | ORAL_CAPSULE | Freq: Every day | ORAL | 4 refills | Status: DC

## 2018-03-07 NOTE — Progress Notes (Signed)
68 y.o. 32P2002 Married White or Caucasian female here for annual exam.  Doing well.  Has done really well since surgery in 2/19.  Denies vaginal bleeding or urinary leakage.  For exercise, she does walk and does weight machines.  Discussed doing Kegels while doing weights.    Did received a flu shot.  Completed shingrix vaccination as well.    Patient's last menstrual period was 02/20/1986 (approximate).          Sexually active: Yes.    The current method of family planning is status post hysterectomy.    Exercising: Yes.    gym, machines Smoker:  no  Health Maintenance: Pap:  2008 neg  History of abnormal Pap:  no MMG:  02/23/17 BIRADS1:Neg. Has appt 2/20 Colonoscopy:  07/23/17 f/u 10 years  BMD:   12/21/14 osteopenia.  She is scheduled for this in February with MMG.  TDaP:  Pt reports this has been done with Dr. Clarene DukeLittle.  Last documentation I have was 2008. Pneumonia vaccine(s):  2018 Shingrix:   Completed  Hep C testing: 2018 neg   Screening Labs: PCP (with Dr. Clarene DukeLittle)   reports that she has never smoked. She has never used smokeless tobacco. She reports current alcohol use of about 5.0 standard drinks of alcohol per week. She reports that she does not use drugs.  Past Medical History:  Diagnosis Date  . Arthritis    left knee  . Family history of adverse reaction to anesthesia    pt daughter very nauseous with anesthesia.  . H/O seasonal allergies    uses OTC med  . History of anemia    resolved with hysterectomy  . History of endometriosis   . History of migraine   . History of pneumonia 1983  . Osteopenia   . Recurrent sinus infections    last Spring 2017     Past Surgical History:  Procedure Laterality Date  . ANTERIOR AND POSTERIOR REPAIR N/A 01/01/2017   Procedure: ANTERIOR (CYSTOCELE) AND POSTERIOR REPAIR (RECTOCELE);  Surgeon: Patton SallesAmundson C Silva, Brook E, MD;  Location: [facility];  Service: Gynecology;  Laterality: N/A;  . APPENDECTOMY    . BLADDER SUSPENSION N/A  01/01/2017   Procedure: TRANSVAGINAL TAPE (TVT) PROCEDURE mid-urethral sling;  Surgeon: Patton SallesAmundson C Silva, Brook E, MD;  Location: [facility];  Service: Gynecology;  Laterality: N/A;  . CYSTOSCOPY N/A 01/01/2017   Procedure: CYSTOSCOPY;  Surgeon: Patton SallesAmundson C Silva, Brook E, MD;  Location: [facility];  Service: Gynecology;  Laterality: N/A;  . LAPAROSCOPIC APPENDECTOMY N/A 12/10/2015   Procedure: APPENDECTOMY LAPAROSCOPIC;  Surgeon: Luretha MurphyMatthew Martin, MD;  Location: [facility];  Service: General;  Laterality: N/A;  . NASAL SINUS SURGERY  2002   again in 2002, revision  . ROBOTIC ASSISTED BILATERAL SALPINGO OOPHERECTOMY  01/01/2017   Procedure: ROBOTIC ASSISTED BILATERAL SALPINGO OOPHORECTOMY, Extensive Lysis of Adhesions;  Surgeon: Patton SallesAmundson C Silva, Brook E, MD;  Location: [facility];  Service: Gynecology;;  . ROBOTIC ASSISTED LAPAROSCOPIC SACROCOLPOPEXY N/A 01/01/2017   Procedure: ROBOTIC ASSISTED LAPAROSCOPIC SACROCOLPOPEXY;  Surgeon: Patton SallesAmundson C Silva, Brook E, MD;  Location: [facility];  Service: Gynecology;  Laterality: N/A;  . TOTAL ABDOMINAL HYSTERECTOMY  1998   DUB/endometriosis  . TOTAL KNEE ARTHROPLASTY Left 11/30/2015   Procedure: LEFT TOTAL KNEE ARTHROPLASTY;  Surgeon: Durene RomansMatthew Olin, MD;  Location: [facility];  Service: Orthopedics;  Laterality: Left;  Adductor Block  . WISDOM TOOTH EXTRACTION      Current Outpatient Medications  Medication Sig Dispense Refill  .  citalopram (CELEXA) 20 MG tablet TAKE 1 TABLET BY MOUTH EVERY DAY 90 tablet 0  . clobetasol (TEMOVATE) 0.05 % external solution Apply 1 application topically 2 (two) times daily as needed (APPLY TO SCALP 1 TO 2 TIMES A DAY FOR FLARES).    Marland Kitchen conjugated estrogens (PREMARIN) vaginal cream Use 1/2 g vaginally every night at bed time for the first 2 weeks, then use 1/2 g vaginally two times per week. 30 g 2  . fluticasone (FLONASE) 50 MCG/ACT nasal spray Place 1-2 sprays into both nostrils daily as needed (for nasal congestion.).     Marland Kitchen gabapentin  (NEURONTIN) 300 MG capsule Take 1 capsule (300 mg total) by mouth at bedtime. 90 capsule 4  . loratadine (CLARITIN) 10 MG tablet Take 10 mg by mouth daily.    . nadolol (CORGARD) 20 MG tablet TAKE 1 TABLET (20 MG TOTAL) BY MOUTH DAILY. 90 tablet 0  . SUMAtriptan (IMITREX) 100 MG tablet Take 1 tablet (100 mg total) by mouth every 2 (two) hours as needed for migraine. 9 tablet 4   No current facility-administered medications for this visit.     Family History  Problem Relation Age of Onset  . Hypertension Maternal Grandfather   . Heart attack Father   . Miscarriages / Stillbirths Sister   . Transient ischemic attack Maternal Grandmother   . Osteoarthritis Mother     Review of Systems  All other systems reviewed and are negative.   Exam:   BP 128/78 (BP Location: Right Arm, Patient Position: Sitting, Cuff Size: Normal)   Pulse 68   Resp 16   Ht 5' 5.5" (1.664 m)   Wt 157 lb 6.4 oz (71.4 kg)   LMP 02/20/1986 (Approximate)   BMI 25.79 kg/m   Height:   Height: 5' 5.5" (166.4 cm)  Ht Readings from Last 3 Encounters:  03/07/18 5' 5.5" (1.664 m)  01/09/17 5\' 6"  (1.676 m)  01/01/17 5' 5.51" (1.664 m)    General appearance: alert, cooperative and appears stated age Head: Normocephalic, without obvious abnormality, atraumatic Neck: no adenopathy, supple, symmetrical, trachea midline and thyroid normal to inspection and palpation Lungs: clear to auscultation bilaterally Breasts: normal appearance, no masses or tenderness Heart: regular rate and rhythm Abdomen: soft, non-tender; bowel sounds normal; no masses,  no organomegaly Extremities: extremities normal, atraumatic, no cyanosis or edema Skin: Skin color, texture, turgor normal. No rashes or lesions Lymph nodes: Cervical, supraclavicular, and axillary nodes normal. No abnormal inguinal nodes palpated Neurologic: Grossly normal   Pelvic: External genitalia:  no lesions              Urethra:  normal appearing urethra with no  masses, tenderness or lesions              Bartholins and Skenes: normal                 Vagina: normal appearing vagina with normal color and discharge, no lesions              Cervix: absent              Pap taken: No. Bimanual Exam:  Uterus:  uterus absent              Adnexa: no mass, fullness, tenderness               Rectovaginal: Confirms               Anus:  normal sphincter tone, no lesions  Chaperone was present for exam.  A:  Well Woman with normal exam PMP, no HRT H/O TAH, then BSO and A&P repair, robotic colpopexy and cystoscopy SUI Vasomotor symptoms improved with Gabapentin and Celexa  P:   Mammogram guidelines reviewed.  This is scheduled for 03/2018 BMD is also scheduled for 03/2018 pap smear not indicated RF for Celexa 20mg  daily.  #90/4RF Gabapentin 300mg  nightly #90/4RF Lab work will be done with PCP Return annually or prn

## 2018-04-02 ENCOUNTER — Ambulatory Visit
Admission: RE | Admit: 2018-04-02 | Discharge: 2018-04-02 | Disposition: A | Discharge status: Home / Self Care | Payer: Medicare Other | Source: Ambulatory Visit | Attending: Obstetrics & Gynecology | Admitting: Obstetrics & Gynecology

## 2018-04-02 DIAGNOSIS — Z1231 Encounter for screening mammogram for malignant neoplasm of breast: Secondary | ICD-10-CM

## 2018-04-02 DIAGNOSIS — E2839 Other primary ovarian failure: Secondary | ICD-10-CM

## 2019-02-27 NOTE — H&P (Signed)
TOTAL KNEE ADMISSION H&P  Patient is being admitted for right total knee arthroplasty.  Subjective:  Chief Complaint:    Right knee primary OA / pain  HPI: EMMACLAIRE SWITALA, 69 y.o. female, has a history of pain and functional disability in the right knee due to arthritis and has failed non-surgical conservative treatments for greater than 12 weeks to include NSAID's and/or analgesics, corticosteriod injections and activity modification.  Onset of symptoms was gradual, starting < 1 year ago with gradually worsening course since that time. The patient noted prior procedures on the knee to include  arthroplasty on the left knee(s).  Patient currently rates pain in the right knee(s) at 7 out of 10 with activity. Patient has worsening of pain with activity and weight bearing, pain that interferes with activities of daily living, pain with passive range of motion, crepitus and joint swelling.  Patient has evidence of periarticular osteophytes and joint space narrowing by imaging studies. There is no active infection.  Risks, benefits and expectations were discussed with the patient.  Risks including but not limited to the risk of anesthesia, blood clots, nerve damage, blood vessel damage, failure of the prosthesis, infection and up to and including death.  Patient understand the risks, benefits and expectations and wishes to proceed with surgery.   PCP: [name], MD  D/C Plans:       Home (same day)  Post-op Meds:       Rx given for ASA, Robaxin, Norco, Celebrex, Iron, Colace and MiraLax  Tranexamic Acid:      To be given - IV   Decadron:      Is to be given  FYI:      ASA  Norco  DME:   Pt already has equipment  PT:   OPPT   Pharmacy: [facility] [employer]    Patient Active Problem List   Diagnosis Date Noted  . Status post surgery 01/01/2017  . S/P laparoscopic appendectomyOct 2017 12/10/2015  . Acute appendicitis 12/10/2015  . Overweight (BMI 25.0-29.9) 12/01/2015  . S/P left TKA  11/30/2015  . Migraine, unspecified, without mention of intractable migraine without mention of status migrainosus 09/02/2013  . History of endometriosis 09/02/2013   Past Medical History:  Diagnosis Date  . Arthritis    left knee  . Family history of adverse reaction to anesthesia    pt daughter very nauseous with anesthesia.  . H/O seasonal allergies    uses OTC med  . History of anemia    resolved with hysterectomy  . History of endometriosis   . History of migraine   . History of pneumonia 1983  . Osteopenia   . Recurrent sinus infections    last Spring 2017     Past Surgical History:  Procedure Laterality Date  . ANTERIOR AND POSTERIOR REPAIR N/A 01/01/2017   Procedure: ANTERIOR (CYSTOCELE) AND POSTERIOR REPAIR (RECTOCELE);  Surgeon: Patton Salles, MD;  Location: [facility];  Service: Gynecology;  Laterality: N/A;  . APPENDECTOMY    . BLADDER SUSPENSION N/A 01/01/2017   Procedure: TRANSVAGINAL TAPE (TVT) PROCEDURE mid-urethral sling;  Surgeon: Patton Salles, MD;  Location: [facility];  Service: Gynecology;  Laterality: N/A;  . CYSTOSCOPY N/A 01/01/2017   Procedure: CYSTOSCOPY;  Surgeon: Patton Salles, MD;  Location: [facility];  Service: Gynecology;  Laterality: N/A;  . LAPAROSCOPIC APPENDECTOMY N/A 12/10/2015   Procedure: APPENDECTOMY LAPAROSCOPIC;  Surgeon: Luretha Murphy, MD;  Location: [facility];  Service: General;  Laterality: N/A;  . NASAL SINUS SURGERY  2002   again in 2002, revision  . ROBOTIC ASSISTED BILATERAL SALPINGO OOPHERECTOMY  01/01/2017   Procedure: ROBOTIC ASSISTED BILATERAL SALPINGO OOPHORECTOMY, Extensive Lysis of Adhesions;  Surgeon: Nunzio Cobbs, MD;  Location: [facility];  Service: Gynecology;;  . ROBOTIC ASSISTED LAPAROSCOPIC SACROCOLPOPEXY N/A 01/01/2017   Procedure: ROBOTIC ASSISTED LAPAROSCOPIC SACROCOLPOPEXY;  Surgeon: Nunzio Cobbs, MD;  Location: [facility];  Service: Gynecology;  Laterality: N/A;  . TOTAL  ABDOMINAL HYSTERECTOMY  1998   DUB/endometriosis  . TOTAL KNEE ARTHROPLASTY Left 11/30/2015   Procedure: LEFT TOTAL KNEE ARTHROPLASTY;  Surgeon: Paralee Cancel, MD;  Location: [facility];  Service: Orthopedics;  Laterality: Left;  Adductor Block  . WISDOM TOOTH EXTRACTION      No current facility-administered medications for this encounter.   Current Outpatient Medications  Medication Sig Dispense Refill Last Dose  . citalopram (CELEXA) 20 MG tablet Take 1 tablet (20 mg total) by mouth daily. 90 tablet 4   . clobetasol (TEMOVATE) 0.05 % external solution Apply 1 application topically 2 (two) times daily as needed (APPLY TO SCALP 1 TO 2 TIMES A DAY FOR FLARES).     Marland Kitchen conjugated estrogens (PREMARIN) vaginal cream Use 1/2 g vaginally every night at bed time for the first 2 weeks, then use 1/2 g vaginally two times per week. 30 g 2   . fluticasone (FLONASE) 50 MCG/ACT nasal spray Place 1-2 sprays into both nostrils daily as needed (for nasal congestion.).      Marland Kitchen gabapentin (NEURONTIN) 300 MG capsule Take 1 capsule (300 mg total) by mouth at bedtime. 90 capsule 4   . loratadine (CLARITIN) 10 MG tablet Take 10 mg by mouth daily.     . nadolol (CORGARD) 20 MG tablet TAKE 1 TABLET (20 MG TOTAL) BY MOUTH DAILY. 90 tablet 0   . SUMAtriptan (IMITREX) 100 MG tablet Take 1 tablet (100 mg total) by mouth every 2 (two) hours as needed for migraine. 9 tablet 4    Allergies  Allergen Reactions  . Novocain [Procaine] Hives and Other (See Comments)    Jittery feeling.  States she can take Lidocaine.    Social History   Tobacco Use  . Smoking status: Never Smoker  . Smokeless tobacco: Never Used  Substance Use Topics  . Alcohol use: Yes    Alcohol/week: 5.0 standard drinks    Types: 5 Standard drinks or equivalent per week    Family History  Problem Relation Age of Onset  . Hypertension Maternal Grandfather   . Heart attack Father   . Miscarriages / Stillbirths Sister   . Transient ischemic attack  Maternal Grandmother   . Osteoarthritis Mother      Review of Systems  Constitutional: Negative.   HENT: Negative.   Eyes: Negative.   Respiratory: Negative.   Cardiovascular: Negative.   Gastrointestinal: Negative.   Genitourinary: Negative.   Musculoskeletal: Positive for joint pain.  Skin: Negative.   Neurological: Negative.   Endo/Heme/Allergies: Negative.   Psychiatric/Behavioral: Negative.      Objective:  Physical Exam  Constitutional: She is oriented to person, place, and time. She appears well-developed.  HENT:  Head: Normocephalic.  Eyes: Pupils are equal, round, and reactive to light.  Neck: No JVD present. No tracheal deviation present. No thyromegaly present.  Cardiovascular: Normal rate, regular rhythm and intact distal pulses.  Respiratory: Effort normal and breath sounds normal. No respiratory distress. She has no  wheezes.  GI: Soft. There is no abdominal tenderness. There is no guarding.  Musculoskeletal:     Cervical back: Neck supple.     Right knee: Swelling and bony tenderness present. No deformity, erythema, ecchymosis or lacerations. Tenderness present.  Lymphadenopathy:    She has no cervical adenopathy.  Neurological: She is alert and oriented to person, place, and time.  Skin: Skin is warm and dry.  Psychiatric: She has a normal mood and affect.     Labs:  Estimated body mass index is 25.79 kg/m as calculated from the following:   Height as of 03/07/18: 5' 5.5" (1.664 m).   Weight as of 03/07/18: 71.4 kg.   Imaging Review Plain radiographs demonstrate severe degenerative joint disease of the right knee on the MRI. The overall alignment isneutral. The bone quality appears to be good for age and reported activity level.      Assessment/Plan:  End stage arthritis, right knee   The patient history, physical examination, clinical judgment of the provider and imaging studies are consistent with end stage degenerative joint disease of the  right knee and total knee arthroplasty is deemed medically necessary. The treatment options including medical management, injection therapy arthroscopy and arthroplasty were discussed at length. The risks and benefits of total knee arthroplasty were presented and reviewed. The risks due to aseptic loosening, infection, stiffness, patella tracking problems, thromboembolic complications and other imponderables were discussed. The patient acknowledged the explanation, agreed to proceed with the plan and consent was signed. Patient is being admitted for inpatient treatment for surgery, pain control, PT, OT, prophylactic antibiotics, VTE prophylaxis, progressive ambulation and ADL's and discharge planning. The patient is planning to be discharged home.      Patient's anticipated LOS is less than 2 midnights, meeting these requirements: - Lives within 1 hour of care - Has a competent adult at home to recover with post-op recover - NO history of  - Chronic pain requiring opiods  - Diabetes  - Coronary Artery Disease  - Heart failure  - Heart attack  - Stroke  - DVT/VTE  - Cardiac arrhythmia  - Respiratory Failure/COPD  - Renal failure  - Anemia  - Advanced Liver disease    Anastasio Auerbach. Kerrie Latour   PA-C  02/27/2019, 2:22 PM

## 2019-03-04 NOTE — Patient Instructions (Addendum)
DUE TO COVID-19 ONLY ONE VISITOR IS ALLOWED TO COME WITH YOU AND STAY IN THE WAITING ROOM ONLY DURING PRE OP AND PROCEDURE DAY OF SURGERY. THE 1 VISITOR MAY VISIT WITH YOU AFTER SURGERY IN YOUR PRIVATE ROOM DURING VISITING HOURS ONLY!  YOU NEED TO HAVE A COVID 19 TEST ON__Friday 01/15/2021_____ @_11 :40 am______, THIS TEST MUST BE DONE BEFORE SURGERY, COME  [address], [place] , .  [facility]) ONCE YOUR COVID TEST IS COMPLETED, PLEASE BEGIN THE QUARANTINE INSTRUCTIONS AS OUTLINED IN YOUR HANDOUT.                Vicki Turner [facility]     Your procedure is scheduled on: Tuesday 03/11/2019   Report to [facility] Main  Entrance    Report to Short Stay at  0530 AM     Call this number if you have problems the morning of surgery [phone]    Remember: Do not eat food  :After Midnight.    NO SOLID FOOD AFTER MIDNIGHT THE NIGHT PRIOR TO SURGERY. NOTHING BY MOUTH EXCEPT CLEAR LIQUIDS UNTIL  0415 am .     PLEASE FINISH ENSURE DRINK PER SURGEON ORDER  WHICH NEEDS TO BE COMPLETED AT  0415 am .   CLEAR LIQUID DIET   Foods Allowed                                                                     Foods Excluded  Coffee and tea, regular and decaf                             liquids that you cannot  Plain Jell-O any favor except red or purple                                           see through such as: Fruit ices (not with fruit pulp)                                     milk, soups, orange juice  Iced Popsicles                                    All solid food Carbonated beverages, regular and diet                                    Cranberry, grape and apple juices Sports drinks like Gatorade Lightly seasoned clear broth or consume(fat free) Sugar, honey syrup  Sample Menu Breakfast                                Lunch  Supper Cranberry juice                    Beef broth                            Chicken  broth Jell-O                                     Grape juice                           Apple juice Coffee or tea                        Jell-O                                      Popsicle                                                Coffee or tea                        Coffee or tea  _____________________________________________________________________      BRUSH YOUR TEETH MORNING OF SURGERY AND RINSE YOUR MOUTH OUT, NO CHEWING GUM CANDY OR MINTS.     Take these medicines the morning of surgery with A SIP OF WATER: Loratidine (Claritin), use Flonase nasal spray                                 You may not have any metal on your body including hair pins and              piercings  Do not wear jewelry, make-up, lotions, powders or perfumes, deodorant             Do not wear nail polish on your fingernails.  Do not shave  48 hours prior to surgery.                Do not bring valuables to the hospital. [facility] IS NOT             RESPONSIBLE   FOR VALUABLES.  Contacts, dentures or bridgework may not be worn into surgery.  Leave suitcase in the car. After surgery it may be brought to your room.     Patients discharged the day of surgery will not be allowed to drive home. IF YOU ARE HAVING SURGERY AND GOING HOME THE SAME DAY, YOU MUST HAVE AN ADULT TO DRIVE YOU HOME AND  BE WITH YOU FOR 24 HOURS. YOU MAY GO HOME BY TAXI OR UBER OR ORTHERWISE, BUT AN ADULT MUST ACCOMPANY YOU HOME AND STAY WITH YOU FOR 24 HOURS.  Name and phone number of your driver:spouseDeirdre Turner-  [phone]                Please read over the following fact sheets you were given: _____________________________________________________________________             [facility] -  Preparing for Surgery Before surgery, you can play an important role.  Because skin is not sterile, your skin needs to be as free of germs as possible.  You can reduce the number of germs on your skin by washing with CHG  (chlorahexidine gluconate) soap before surgery.  CHG is an antiseptic cleaner which kills germs and bonds with the skin to continue killing germs even after washing. Please DO NOT use if you have an allergy to CHG or antibacterial soaps.  If your skin becomes reddened/irritated stop using the CHG and inform your nurse when you arrive at Short Stay. Do not shave (including legs and underarms) for at least 48 hours prior to the first CHG shower.  You may shave your face/neck. Please follow these instructions carefully:  1.  Shower with CHG Soap the night before surgery and the  morning of Surgery.  2.  If you choose to wash your hair, wash your hair first as usual with your  normal  shampoo.  3.  After you shampoo, rinse your hair and body thoroughly to remove the  shampoo.                           4.  Use CHG as you would any other liquid soap.  You can apply chg directly  to the skin and wash                       Gently with a scrungie or clean washcloth.  5.  Apply the CHG Soap to your body ONLY FROM THE NECK DOWN.   Do not use on face/ open                           Wound or open sores. Avoid contact with eyes, ears mouth and genitals (private parts).                       Wash face,  Genitals (private parts) with your normal soap.             6.  Wash thoroughly, paying special attention to the area where your surgery  will be performed.  7.  Thoroughly rinse your body with warm water from the neck down.  8.  DO NOT shower/wash with your normal soap after using and rinsing off  the CHG Soap.                9.  Pat yourself dry with a clean towel.            10.  Wear clean pajamas.            11.  Place clean sheets on your bed the night of your first shower and do not  sleep with pets. Day of Surgery : Do not apply any lotions/deodorants the morning of surgery.  Please wear clean clothes to the hospital/surgery center.  FAILURE TO FOLLOW THESE INSTRUCTIONS MAY RESULT IN THE CANCELLATION OF  YOUR SURGERY PATIENT SIGNATURE_________________________________  NURSE SIGNATURE__________________________________  ________________________________________________________________________   Vicki Turner  An incentive spirometer is a tool that can help keep your lungs clear and active. This tool measures how well you are filling your lungs with each breath. Taking long deep breaths may help reverse or decrease the chance of developing breathing (pulmonary) problems (especially infection) following:  A long period of time when you  are unable to move or be active. BEFORE THE PROCEDURE   If the spirometer includes an indicator to show your best effort, your nurse or respiratory therapist will set it to a desired goal.  If possible, sit up straight or lean slightly forward. Try not to slouch.  Hold the incentive spirometer in an upright position. INSTRUCTIONS FOR USE  1. Sit on the edge of your bed if possible, or sit up as far as you can in bed or on a chair. 2. Hold the incentive spirometer in an upright position. 3. Breathe out normally. 4. Place the mouthpiece in your mouth and seal your lips tightly around it. 5. Breathe in slowly and as deeply as possible, raising the piston or the ball toward the top of the column. 6. Hold your breath for 3-5 seconds or for as long as possible. Allow the piston or ball to fall to the bottom of the column. 7. Remove the mouthpiece from your mouth and breathe out normally. 8. Rest for a few seconds and repeat Steps 1 through 7 at least 10 times every 1-2 hours when you are awake. Take your time and take a few normal breaths between deep breaths. 9. The spirometer may include an indicator to show your best effort. Use the indicator as a goal to work toward during each repetition. 10. After each set of 10 deep breaths, practice coughing to be sure your lungs are clear. If you have an incision (the cut made at the time of surgery), support your  incision when coughing by placing a pillow or rolled up towels firmly against it. Once you are able to get out of bed, walk around indoors and cough well. You may stop using the incentive spirometer when instructed by your caregiver.  RISKS AND COMPLICATIONS  Take your time so you do not get dizzy or light-headed.  If you are in pain, you may need to take or ask for pain medication before doing incentive spirometry. It is harder to take a deep breath if you are having pain. AFTER USE  Rest and breathe slowly and easily.  It can be helpful to keep track of a log of your progress. Your caregiver can provide you with a simple table to help with this. If you are using the spirometer at home, follow these instructions: [place] IF:   You are having difficultly using the spirometer.  You have trouble using the spirometer as often as instructed.  Your pain medication is not giving enough relief while using the spirometer.  You develop fever of 100.5 F (38.1 C) or higher. SEEK IMMEDIATE MEDICAL CARE IF:   You cough up bloody sputum that had not been present before.  You develop fever of 102 F (38.9 C) or greater.  You develop worsening pain at or near the incision site. MAKE SURE YOU:   Understand these instructions.  Will watch your condition.  Will get help right away if you are not doing well or get worse. Document Released: 06/19/2006 Document Revised: 05/01/2011 Document Reviewed: 08/20/2006 ExitCare Patient Information 2014 ExitCare, [place].   ________________________________________________________________________  WHAT IS A BLOOD TRANSFUSION? Blood Transfusion Information  A transfusion is the replacement of blood or some of its parts. Blood is made up of multiple cells which provide different functions.  Red blood cells carry oxygen and are used for blood loss replacement.  White blood cells fight against infection.  Platelets control bleeding.  Plasma  helps clot blood.  Other blood  products are available for specialized needs, such as hemophilia or other clotting disorders. BEFORE THE TRANSFUSION  Who gives blood for transfusions?   Healthy volunteers who are fully evaluated to make sure their blood is safe. This is blood bank blood. Transfusion therapy is the safest it has ever been in the practice of medicine. Before blood is taken from a donor, a complete history is taken to make sure that person has no history of diseases nor engages in risky social behavior (examples are intravenous drug use or sexual activity with multiple partners). The donor's travel history is screened to minimize risk of transmitting infections, such as malaria. The donated blood is tested for signs of infectious diseases, such as HIV and hepatitis. The blood is then tested to be sure it is compatible with you in order to minimize the chance of a transfusion reaction. If you or a relative donates blood, this is often done in anticipation of surgery and is not appropriate for emergency situations. It takes many days to process the donated blood. RISKS AND COMPLICATIONS Although transfusion therapy is very safe and saves many lives, the main dangers of transfusion include:   Getting an infectious disease.  Developing a transfusion reaction. This is an allergic reaction to something in the blood you were given. Every precaution is taken to prevent this. The decision to have a blood transfusion has been considered carefully by your caregiver before blood is given. Blood is not given unless the benefits outweigh the risks. AFTER THE TRANSFUSION  Right after receiving a blood transfusion, you will usually feel much better and more energetic. This is especially true if your red blood cells have gotten low (anemic). The transfusion raises the level of the red blood cells which carry oxygen, and this usually causes an energy increase.  The nurse administering the transfusion  will monitor you carefully for complications. HOME CARE INSTRUCTIONS  No special instructions are needed after a transfusion. You may find your energy is better. Speak with your caregiver about any limitations on activity for underlying diseases you may have. SEEK MEDICAL CARE IF:   Your condition is not improving after your transfusion.  You develop redness or irritation at the intravenous (IV) site. SEEK IMMEDIATE MEDICAL CARE IF:  Any of the following symptoms occur over the next 12 hours:  Shaking chills.  You have a temperature by mouth above 102 F (38.9 C), not controlled by medicine.  Chest, back, or muscle pain.  People around you feel you are not acting correctly or are confused.  Shortness of breath or difficulty breathing.  Dizziness and fainting.  You get a rash or develop hives.  You have a decrease in urine output.  Your urine turns a dark color or changes to pink, red, or brown. Any of the following symptoms occur over the next 10 days:  You have a temperature by mouth above 102 F (38.9 C), not controlled by medicine.  Shortness of breath.  Weakness after normal activity.  The white part of the eye turns yellow (jaundice).  You have a decrease in the amount of urine or are urinating less often.  Your urine turns a dark color or changes to pink, red, or brown. Document Released: 02/04/2000 Document Revised: 05/01/2011 Document Reviewed: 09/23/2007 [facility] Patient Information 2014 [place], [place].  _______________________________________________________________________

## 2019-03-06 ENCOUNTER — Other Ambulatory Visit (HOSPITAL_COMMUNITY): Payer: Self-pay | Admitting: *Deleted

## 2019-03-07 ENCOUNTER — Other Ambulatory Visit (HOSPITAL_COMMUNITY)
Admission: RE | Admit: 2019-03-07 | Discharge: 2019-03-07 | Disposition: A | Discharge status: Home / Self Care | Payer: Medicare PPO | Source: Ambulatory Visit | Attending: Orthopedic Surgery | Admitting: Orthopedic Surgery

## 2019-03-07 ENCOUNTER — Other Ambulatory Visit: Payer: Self-pay

## 2019-03-07 ENCOUNTER — Encounter (HOSPITAL_COMMUNITY)
Admission: RE | Admit: 2019-03-07 | Discharge: 2019-03-07 | Disposition: A | Discharge status: Home / Self Care | Payer: Medicare PPO | Source: Ambulatory Visit | Attending: Orthopedic Surgery | Admitting: Orthopedic Surgery

## 2019-03-07 ENCOUNTER — Encounter (HOSPITAL_COMMUNITY): Payer: Self-pay

## 2019-03-07 DIAGNOSIS — Z01812 Encounter for preprocedural laboratory examination: Secondary | ICD-10-CM | POA: Insufficient documentation

## 2019-03-07 DIAGNOSIS — Z20822 Contact with and (suspected) exposure to covid-19: Secondary | ICD-10-CM | POA: Diagnosis not present

## 2019-03-07 HISTORY — DX: Headache, unspecified: R51.9

## 2019-03-07 LAB — BASIC METABOLIC PANEL
Anion gap: 8 (ref 5–15)
BUN: 22 mg/dL (ref 8–23)
CO2: 24 mmol/L (ref 22–32)
Calcium: 9.3 mg/dL (ref 8.9–10.3)
Chloride: 101 mmol/L (ref 98–111)
Creatinine, Ser: 0.78 mg/dL (ref 0.44–1.00)
GFR calc Af Amer: >60 mL/min (ref >60)
GFR calc non Af Amer: >60 mL/min (ref >60)
Glucose, Bld: 73 mg/dL (ref 70–99)
Potassium: 5 mmol/L (ref 3.5–5.1)
Sodium: 133 mmol/L — ABNORMAL LOW (ref 135–145)

## 2019-03-07 LAB — CBC
HCT: 41.7 % (ref 36.0–46.0)
Hemoglobin: 13.5 g/dL (ref 12.0–15.0)
MCH: 32.1 pg (ref 26.0–34.0)
MCHC: 32.4 g/dL (ref 30.0–36.0)
MCV: 99 fL (ref 80.0–100.0)
Platelets: 346 10*3/uL (ref 150–400)
RBC: 4.21 MIL/uL (ref 3.87–5.11)
RDW: 14.6 % (ref 11.5–15.5)
WBC: 10.7 10*3/uL — ABNORMAL HIGH (ref 4.0–10.5)
nRBC: 0 % (ref 0.0–0.2)

## 2019-03-07 LAB — SURGICAL PCR SCREEN
MRSA, PCR: POSITIVE — AB
Staphylococcus aureus: POSITIVE — AB

## 2019-03-07 NOTE — Progress Notes (Addendum)
PCP - Dr. [name] Cardiologist - n/a  Chest x-ray - n/a EKG - done week of 02/24/2019 at Dr. Caryn Bee Little's office Stress Test -n/a  ECHO - n/a Cardiac Cath - n/a  Sleep Study -n/a  CPAP - n/a  Fasting Blood Sugar - n/a Checks Blood Sugar _0____ times a day  Blood Thinner Instructions:n/a Aspirin Instructions:n/a Last Dose:n/a  Takes Diclofenac and has not taken it for 2 weeks.  Anesthesia review:   Patient has a history of endometriosis, migraines, anemia,  And osteoarthritis.  Patient denies shortness of breath, fever, cough and chest pain at PAT appointment   Patient verbalized understanding of instructions that were given to them at the PAT appointment. Patient was also instructed that they will need to review over the PAT instructions again at home before surgery.

## 2019-03-08 LAB — NOVEL CORONAVIRUS, NAA (HOSP ORDER, SEND-OUT TO REF LAB; TAT 18-24 HRS): SARS-CoV-2, NAA: NOT DETECTED

## 2019-03-11 ENCOUNTER — Encounter (HOSPITAL_COMMUNITY)
Admission: RE | Disposition: A | Discharge status: Home / Self Care | Payer: Self-pay | Source: Home / Self Care | Attending: Orthopedic Surgery

## 2019-03-11 ENCOUNTER — Encounter (HOSPITAL_COMMUNITY): Payer: Self-pay | Admitting: Orthopedic Surgery

## 2019-03-11 ENCOUNTER — Other Ambulatory Visit: Payer: Self-pay

## 2019-03-11 ENCOUNTER — Ambulatory Visit (HOSPITAL_COMMUNITY): Payer: Medicare PPO | Admitting: Physician Assistant

## 2019-03-11 ENCOUNTER — Ambulatory Visit (HOSPITAL_COMMUNITY)
Admission: RE | Admit: 2019-03-11 | Discharge: 2019-03-11 | Disposition: A | Discharge status: Home / Self Care | Payer: Medicare PPO | Attending: Orthopedic Surgery | Admitting: Orthopedic Surgery

## 2019-03-11 DIAGNOSIS — M8580 Other specified disorders of bone density and structure, unspecified site: Secondary | ICD-10-CM | POA: Insufficient documentation

## 2019-03-11 DIAGNOSIS — Z79899 Other long term (current) drug therapy: Secondary | ICD-10-CM | POA: Diagnosis not present

## 2019-03-11 DIAGNOSIS — M1711 Unilateral primary osteoarthritis, right knee: Secondary | ICD-10-CM | POA: Diagnosis present

## 2019-03-11 DIAGNOSIS — Z96652 Presence of left artificial knee joint: Secondary | ICD-10-CM | POA: Insufficient documentation

## 2019-03-11 DIAGNOSIS — Z884 Allergy status to anesthetic agent status: Secondary | ICD-10-CM | POA: Insufficient documentation

## 2019-03-11 DIAGNOSIS — G43909 Migraine, unspecified, not intractable, without status migrainosus: Secondary | ICD-10-CM | POA: Insufficient documentation

## 2019-03-11 DIAGNOSIS — Z96651 Presence of right artificial knee joint: Secondary | ICD-10-CM

## 2019-03-11 HISTORY — PX: TOTAL KNEE ARTHROPLASTY: SHX125

## 2019-03-11 LAB — TYPE AND SCREEN
ABO/RH(D): O POS
Antibody Screen: NEGATIVE

## 2019-03-11 SURGERY — ARTHROPLASTY, KNEE, TOTAL
Anesthesia: Spinal | Site: Knee | Laterality: Right

## 2019-03-11 MED ORDER — HYDROMORPHONE HCL 2 MG/ML IJ SOLN
INTRAMUSCULAR | Status: AC
  Filled 2019-03-11: qty 1

## 2019-03-11 MED ORDER — PROPOFOL 10 MG/ML IV BOLUS
INTRAVENOUS | Status: AC
  Filled 2019-03-11: qty 20

## 2019-03-11 MED ORDER — MEPIVACAINE HCL (PF) 2 % IJ SOLN
INTRAMUSCULAR | Status: DC | PRN
  Administered 2019-03-11: 3.2 mL via INTRATHECAL

## 2019-03-11 MED ORDER — CHLORHEXIDINE GLUCONATE 4 % EX LIQD: 60.0000 mL | Freq: Once | CUTANEOUS | Status: DC

## 2019-03-11 MED ORDER — CEFAZOLIN SODIUM-DEXTROSE 2-4 GM/100ML-% IV SOLN
2.0000 g | INTRAVENOUS | Status: AC
  Administered 2019-03-11: 2 g via INTRAVENOUS
  Filled 2019-03-11: qty 100

## 2019-03-11 MED ORDER — POVIDONE-IODINE 10 % EX SWAB
2.0000 "application " | Freq: Once | CUTANEOUS | Status: AC
  Administered 2019-03-11: 2 via TOPICAL

## 2019-03-11 MED ORDER — BISACODYL 10 MG RE SUPP
10.0000 mg | Freq: Every day | RECTAL | Status: DC | PRN
  Filled 2019-03-11: qty 1

## 2019-03-11 MED ORDER — METOCLOPRAMIDE HCL 5 MG PO TABS
5.0000 mg | ORAL_TABLET | Freq: Three times a day (TID) | ORAL | Status: DC | PRN
  Filled 2019-03-11: qty 2

## 2019-03-11 MED ORDER — CEFAZOLIN SODIUM-DEXTROSE 2-4 GM/100ML-% IV SOLN
2.0000 g | Freq: Four times a day (QID) | INTRAVENOUS | Status: DC
  Administered 2019-03-11: 12:00:00 2 g via INTRAVENOUS

## 2019-03-11 MED ORDER — FENTANYL CITRATE (PF) 100 MCG/2ML IJ SOLN
INTRAMUSCULAR | Status: DC | PRN
  Administered 2019-03-11: 50 ug via INTRAVENOUS

## 2019-03-11 MED ORDER — EPHEDRINE SULFATE-NACL 50-0.9 MG/10ML-% IV SOSY
PREFILLED_SYRINGE | INTRAVENOUS | Status: DC | PRN
  Administered 2019-03-11 (×6): 5 mg via INTRAVENOUS

## 2019-03-11 MED ORDER — LACTATED RINGERS IV SOLN: INTRAVENOUS | Status: DC

## 2019-03-11 MED ORDER — ALUM & MAG HYDROXIDE-SIMETH 200-200-20 MG/5ML PO SUSP
15.0000 mL | ORAL | Status: DC | PRN
  Filled 2019-03-11: qty 30

## 2019-03-11 MED ORDER — DOCUSATE SODIUM 100 MG PO CAPS: 100.0000 mg | ORAL_CAPSULE | Freq: Two times a day (BID) | ORAL | Status: DC

## 2019-03-11 MED ORDER — SUMATRIPTAN SUCCINATE 50 MG PO TABS: 100.0000 mg | ORAL_TABLET | ORAL | Status: DC | PRN

## 2019-03-11 MED ORDER — KETOROLAC TROMETHAMINE 30 MG/ML IJ SOLN
INTRAMUSCULAR | Status: AC
  Filled 2019-03-11: qty 2

## 2019-03-11 MED ORDER — LORATADINE 10 MG PO TABS: 10.0000 mg | ORAL_TABLET | Freq: Every day | ORAL | Status: DC

## 2019-03-11 MED ORDER — SODIUM CHLORIDE (PF) 0.9 % IJ SOLN
INTRAMUSCULAR | Status: DC | PRN
  Administered 2019-03-11: 30 mL

## 2019-03-11 MED ORDER — TRANEXAMIC ACID-NACL 1000-0.7 MG/100ML-% IV SOLN
1000.0000 mg | INTRAVENOUS | Status: AC
  Administered 2019-03-11: 1000 mg via INTRAVENOUS
  Filled 2019-03-11: qty 100

## 2019-03-11 MED ORDER — ONDANSETRON HCL 4 MG/2ML IJ SOLN: 4.0000 mg | Freq: Four times a day (QID) | INTRAMUSCULAR | Status: DC | PRN

## 2019-03-11 MED ORDER — HYDROCODONE-ACETAMINOPHEN 7.5-325 MG PO TABS
1.0000 | ORAL_TABLET | ORAL | Status: DC | PRN
  Administered 2019-03-11: 1 via ORAL

## 2019-03-11 MED ORDER — VANCOMYCIN HCL IN DEXTROSE 1-5 GM/200ML-% IV SOLN
1000.0000 mg | INTRAVENOUS | Status: AC
  Administered 2019-03-11: 1000 mg via INTRAVENOUS
  Filled 2019-03-11: qty 200

## 2019-03-11 MED ORDER — PROPOFOL 500 MG/50ML IV EMUL
INTRAVENOUS | Status: DC | PRN
  Administered 2019-03-11: 75 ug/kg/min via INTRAVENOUS

## 2019-03-11 MED ORDER — VANCOMYCIN HCL IN DEXTROSE 1-5 GM/200ML-% IV SOLN: 1000.0000 mg | Freq: Once | INTRAVENOUS | Status: DC

## 2019-03-11 MED ORDER — HYDROCODONE-ACETAMINOPHEN 5-325 MG PO TABS: 1.0000 | ORAL_TABLET | ORAL | Status: DC | PRN

## 2019-03-11 MED ORDER — MIDAZOLAM HCL 2 MG/2ML IJ SOLN
INTRAMUSCULAR | Status: DC | PRN
  Administered 2019-03-11: 1 mg via INTRAVENOUS

## 2019-03-11 MED ORDER — FLUTICASONE PROPIONATE 50 MCG/ACT NA SUSP: 1.0000 | Freq: Every day | NASAL | Status: DC

## 2019-03-11 MED ORDER — FAMOTIDINE 20 MG PO TABS: 20.0000 mg | ORAL_TABLET | Freq: Two times a day (BID) | ORAL | Status: DC | PRN

## 2019-03-11 MED ORDER — METHOCARBAMOL 500 MG PO TABS: 500.0000 mg | ORAL_TABLET | Freq: Four times a day (QID) | ORAL | Status: DC | PRN

## 2019-03-11 MED ORDER — LACTATED RINGERS IV BOLUS
500.0000 mL | Freq: Once | INTRAVENOUS | Status: AC
  Administered 2019-03-11: 500 mL via INTRAVENOUS

## 2019-03-11 MED ORDER — BUPIVACAINE HCL (PF) 0.25 % IJ SOLN
INTRAMUSCULAR | Status: AC
  Filled 2019-03-11: qty 60

## 2019-03-11 MED ORDER — ONDANSETRON HCL 4 MG/2ML IJ SOLN: 4.0000 mg | Freq: Once | INTRAMUSCULAR | Status: DC | PRN

## 2019-03-11 MED ORDER — DIPHENHYDRAMINE HCL 12.5 MG/5ML PO ELIX
12.5000 mg | ORAL_SOLUTION | ORAL | Status: DC | PRN
  Filled 2019-03-11: qty 10

## 2019-03-11 MED ORDER — METHOCARBAMOL 500 MG IVPB - SIMPLE MED
INTRAVENOUS | Status: AC
  Filled 2019-03-11: qty 50

## 2019-03-11 MED ORDER — CELECOXIB 200 MG PO CAPS: 200.0000 mg | ORAL_CAPSULE | Freq: Two times a day (BID) | ORAL | Status: DC

## 2019-03-11 MED ORDER — NADOLOL 20 MG PO TABS: 20.0000 mg | ORAL_TABLET | Freq: Every day | ORAL | Status: DC

## 2019-03-11 MED ORDER — ROPIVACAINE HCL 5 MG/ML IJ SOLN
INTRAMUSCULAR | Status: DC | PRN
  Administered 2019-03-11: 20 mL via PERINEURAL

## 2019-03-11 MED ORDER — HYDROCODONE-ACETAMINOPHEN 7.5-325 MG PO TABS
ORAL_TABLET | ORAL | Status: AC
  Filled 2019-03-11: qty 1

## 2019-03-11 MED ORDER — KETOROLAC TROMETHAMINE 30 MG/ML IJ SOLN
INTRAMUSCULAR | Status: DC | PRN
  Administered 2019-03-11: 30 mg

## 2019-03-11 MED ORDER — 0.9 % SODIUM CHLORIDE (POUR BTL) OPTIME
TOPICAL | Status: DC | PRN
  Administered 2019-03-11: 08:00:00 1000 mL

## 2019-03-11 MED ORDER — CELECOXIB 200 MG PO CAPS
ORAL_CAPSULE | ORAL | Status: AC
  Filled 2019-03-11: qty 1

## 2019-03-11 MED ORDER — DEXAMETHASONE SODIUM PHOSPHATE 10 MG/ML IJ SOLN: 10.0000 mg | Freq: Once | INTRAMUSCULAR | Status: DC

## 2019-03-11 MED ORDER — POLYETHYLENE GLYCOL 3350 17 G PO PACK: 17.0000 g | PACK | Freq: Two times a day (BID) | ORAL | Status: DC

## 2019-03-11 MED ORDER — HYDROMORPHONE HCL 1 MG/ML IJ SOLN
0.5000 mg | INTRAMUSCULAR | Status: DC | PRN
  Administered 2019-03-11: 0.5 mg via INTRAVENOUS

## 2019-03-11 MED ORDER — DEXAMETHASONE SODIUM PHOSPHATE 10 MG/ML IJ SOLN
10.0000 mg | Freq: Once | INTRAMUSCULAR | Status: AC
  Administered 2019-03-11: 10 mg via INTRAVENOUS

## 2019-03-11 MED ORDER — ONDANSETRON HCL 4 MG/2ML IJ SOLN
INTRAMUSCULAR | Status: AC
  Filled 2019-03-11: qty 2

## 2019-03-11 MED ORDER — MAGNESIUM CITRATE PO SOLN: 1.0000 | Freq: Once | ORAL | Status: DC | PRN

## 2019-03-11 MED ORDER — ONDANSETRON HCL 4 MG/2ML IJ SOLN
INTRAMUSCULAR | Status: DC | PRN
  Administered 2019-03-11: 4 mg via INTRAVENOUS

## 2019-03-11 MED ORDER — SODIUM CHLORIDE (PF) 0.9 % IJ SOLN
INTRAMUSCULAR | Status: AC
  Filled 2019-03-11: qty 100

## 2019-03-11 MED ORDER — TRANEXAMIC ACID-NACL 1000-0.7 MG/100ML-% IV SOLN: 1000.0000 mg | Freq: Once | INTRAVENOUS | Status: DC

## 2019-03-11 MED ORDER — METHOCARBAMOL 500 MG IVPB - SIMPLE MED
500.0000 mg | Freq: Four times a day (QID) | INTRAVENOUS | Status: DC | PRN
  Administered 2019-03-11: 500 mg via INTRAVENOUS

## 2019-03-11 MED ORDER — STERILE WATER FOR IRRIGATION IR SOLN
Status: DC | PRN
  Administered 2019-03-11 (×2): 1000 mL

## 2019-03-11 MED ORDER — ONDANSETRON HCL 4 MG PO TABS
4.0000 mg | ORAL_TABLET | Freq: Four times a day (QID) | ORAL | Status: DC | PRN
  Filled 2019-03-11: qty 1

## 2019-03-11 MED ORDER — MIDAZOLAM HCL 2 MG/2ML IJ SOLN
INTRAMUSCULAR | Status: AC
  Filled 2019-03-11: qty 2

## 2019-03-11 MED ORDER — CLONIDINE HCL (ANALGESIA) 100 MCG/ML EP SOLN
EPIDURAL | Status: DC | PRN
  Administered 2019-03-11: 100 ug

## 2019-03-11 MED ORDER — BUPIVACAINE HCL 0.25 % IJ SOLN
INTRAMUSCULAR | Status: DC | PRN
  Administered 2019-03-11: 30 mL

## 2019-03-11 MED ORDER — MENTHOL 3 MG MT LOZG: 1.0000 | LOZENGE | OROMUCOSAL | Status: DC | PRN

## 2019-03-11 MED ORDER — DEXAMETHASONE SODIUM PHOSPHATE 10 MG/ML IJ SOLN
INTRAMUSCULAR | Status: AC
  Filled 2019-03-11: qty 1

## 2019-03-11 MED ORDER — GABAPENTIN 300 MG PO CAPS: 300.0000 mg | ORAL_CAPSULE | Freq: Every day | ORAL | Status: DC

## 2019-03-11 MED ORDER — CITALOPRAM HYDROBROMIDE 20 MG PO TABS: 20.0000 mg | ORAL_TABLET | Freq: Every day | ORAL | Status: DC

## 2019-03-11 MED ORDER — SODIUM CHLORIDE 0.9 % IR SOLN
Status: DC | PRN
  Administered 2019-03-11: 1000 mL

## 2019-03-11 MED ORDER — EPHEDRINE 5 MG/ML INJ
INTRAVENOUS | Status: AC
  Filled 2019-03-11: qty 10

## 2019-03-11 MED ORDER — CEFAZOLIN SODIUM-DEXTROSE 2-4 GM/100ML-% IV SOLN
INTRAVENOUS | Status: AC
  Filled 2019-03-11: qty 100

## 2019-03-11 MED ORDER — ACETAMINOPHEN 325 MG PO TABS: 325.0000 mg | ORAL_TABLET | Freq: Four times a day (QID) | ORAL | Status: DC | PRN

## 2019-03-11 MED ORDER — FERROUS SULFATE 325 (65 FE) MG PO TABS: 325.0000 mg | ORAL_TABLET | Freq: Two times a day (BID) | ORAL | Status: DC

## 2019-03-11 MED ORDER — HYDROMORPHONE HCL 1 MG/ML IJ SOLN
0.2500 mg | INTRAMUSCULAR | Status: DC | PRN
  Filled 2019-03-11: qty 1

## 2019-03-11 MED ORDER — OXYCODONE HCL 5 MG/5ML PO SOLN: 5.0000 mg | Freq: Once | ORAL | Status: DC | PRN

## 2019-03-11 MED ORDER — PHENYLEPHRINE HCL (PRESSORS) 10 MG/ML IV SOLN
INTRAVENOUS | Status: AC
  Filled 2019-03-11: qty 1

## 2019-03-11 MED ORDER — LIDOCAINE 2% (20 MG/ML) 5 ML SYRINGE
INTRAMUSCULAR | Status: AC
  Filled 2019-03-11: qty 5

## 2019-03-11 MED ORDER — MEPIVACAINE HCL (PF) 2 % IJ SOLN: INTRAMUSCULAR | Status: DC | PRN

## 2019-03-11 MED ORDER — FENTANYL CITRATE (PF) 100 MCG/2ML IJ SOLN
INTRAMUSCULAR | Status: AC
  Filled 2019-03-11: qty 2

## 2019-03-11 MED ORDER — LACTATED RINGERS IV BOLUS
250.0000 mL | Freq: Once | INTRAVENOUS | Status: AC
  Administered 2019-03-11: 250 mL via INTRAVENOUS

## 2019-03-11 MED ORDER — METOCLOPRAMIDE HCL 5 MG/ML IJ SOLN: 5.0000 mg | Freq: Three times a day (TID) | INTRAMUSCULAR | Status: DC | PRN

## 2019-03-11 MED ORDER — SODIUM CHLORIDE 0.9 % IV SOLN: INTRAVENOUS | Status: DC

## 2019-03-11 MED ORDER — ASPIRIN 81 MG PO CHEW: 81.0000 mg | CHEWABLE_TABLET | Freq: Two times a day (BID) | ORAL | Status: DC

## 2019-03-11 MED ORDER — OXYCODONE HCL 5 MG PO TABS: 5.0000 mg | ORAL_TABLET | Freq: Once | ORAL | Status: DC | PRN

## 2019-03-11 MED ORDER — PHENOL 1.4 % MT LIQD: 1.0000 | OROMUCOSAL | Status: DC | PRN

## 2019-03-11 MED ORDER — HYDROMORPHONE HCL 1 MG/ML IJ SOLN
INTRAMUSCULAR | Status: DC | PRN
  Administered 2019-03-11: .5 mg via INTRAVENOUS

## 2019-03-11 MED ORDER — TRANEXAMIC ACID-NACL 1000-0.7 MG/100ML-% IV SOLN
INTRAVENOUS | Status: AC
  Filled 2019-03-11: qty 100

## 2019-03-11 SURGICAL SUPPLY — 63 items
ADH SKN CLS APL DERMABOND .7 (GAUZE/BANDAGES/DRESSINGS) ×1
ATTUNE MED ANAT PAT 35 KNEE (Knees) ×1 IMPLANT
ATTUNE PS FEM RT SZ 4 CEM KNEE (Femur) ×1 IMPLANT
ATTUNE PSRP INSR SZ4 7 KNEE (Insert) ×1 IMPLANT
BAG SPEC THK2 15X12 ZIP CLS (MISCELLANEOUS)
BAG ZIPLOCK 12X15 (MISCELLANEOUS) IMPLANT
BASE TIBIAL ROT PLAT SZ 3 KNEE (Knees) IMPLANT
BLADE SAW SGTL 11.0X1.19X90.0M (BLADE) IMPLANT
BLADE SAW SGTL 13.0X1.19X90.0M (BLADE) ×2 IMPLANT
BLADE SURG SZ10 CARB STEEL (BLADE) ×4 IMPLANT
BNDG ELASTIC 6X5.8 VLCR STR LF (GAUZE/BANDAGES/DRESSINGS) ×2 IMPLANT
BOWL SMART MIX CTS (DISPOSABLE) ×2 IMPLANT
BSPLAT TIB 3 CMNT ROT PLAT STR (Knees) ×1 IMPLANT
CEMENT HV SMART SET (Cement) ×2 IMPLANT
COVER SURGICAL LIGHT HANDLE (MISCELLANEOUS) ×2 IMPLANT
COVER WAND RF STERILE (DRAPES) IMPLANT
CUFF TOURN SGL QUICK 34 (TOURNIQUET CUFF) ×2
CUFF TRNQT CYL 34X4.125X (TOURNIQUET CUFF) ×1 IMPLANT
DECANTER SPIKE VIAL GLASS SM (MISCELLANEOUS) ×4 IMPLANT
DERMABOND ADVANCED (GAUZE/BANDAGES/DRESSINGS) ×1
DERMABOND ADVANCED .7 DNX12 (GAUZE/BANDAGES/DRESSINGS) ×1 IMPLANT
DRAPE U-SHAPE 47X51 STRL (DRAPES) ×2 IMPLANT
DRESSING AQUACEL AG SP 3.5X10 (GAUZE/BANDAGES/DRESSINGS) ×1 IMPLANT
DRSG AQUACEL AG SP 3.5X10 (GAUZE/BANDAGES/DRESSINGS) ×2
DURAPREP 26ML APPLICATOR (WOUND CARE) ×4 IMPLANT
ELECT REM PT RETURN 15FT ADLT (MISCELLANEOUS) ×2 IMPLANT
GLOVE BIO SURGEON STRL SZ 6 (GLOVE) ×2 IMPLANT
GLOVE BIOGEL PI IND STRL 6.5 (GLOVE) ×1 IMPLANT
GLOVE BIOGEL PI IND STRL 7.5 (GLOVE) ×1 IMPLANT
GLOVE BIOGEL PI IND STRL 8.5 (GLOVE) ×1 IMPLANT
GLOVE BIOGEL PI INDICATOR 6.5 (GLOVE) ×1
GLOVE BIOGEL PI INDICATOR 7.5 (GLOVE) ×1
GLOVE BIOGEL PI INDICATOR 8.5 (GLOVE) ×1
GLOVE ECLIPSE 8.0 STRL XLNG CF (GLOVE) ×2 IMPLANT
GLOVE ORTHO TXT STRL SZ7.5 (GLOVE) ×2 IMPLANT
GOWN STRL REUS W/ TWL LRG LVL3 (GOWN DISPOSABLE) ×1 IMPLANT
GOWN STRL REUS W/TWL 2XL LVL3 (GOWN DISPOSABLE) ×2 IMPLANT
GOWN STRL REUS W/TWL LRG LVL3 (GOWN DISPOSABLE) ×4 IMPLANT
HANDPIECE INTERPULSE COAX TIP (DISPOSABLE) ×2
HOLDER FOLEY CATH W/STRAP (MISCELLANEOUS) IMPLANT
KIT TURNOVER KIT A (KITS) IMPLANT
MANIFOLD NEPTUNE II (INSTRUMENTS) ×2 IMPLANT
NDL SAFETY ECLIPSE 18X1.5 (NEEDLE) IMPLANT
NEEDLE HYPO 18GX1.5 SHARP (NEEDLE)
NS IRRIG 1000ML POUR BTL (IV SOLUTION) ×2 IMPLANT
PACK TOTAL KNEE CUSTOM (KITS) ×2 IMPLANT
PENCIL SMOKE EVACUATOR (MISCELLANEOUS) IMPLANT
PIN DRILL FIX HALF THREAD (BIT) ×1 IMPLANT
PIN FIX SIGMA LCS THRD HI (PIN) ×1 IMPLANT
PROTECTOR NERVE ULNAR (MISCELLANEOUS) ×2 IMPLANT
SET HNDPC FAN SPRY TIP SCT (DISPOSABLE) ×1 IMPLANT
SET PAD KNEE POSITIONER (MISCELLANEOUS) ×2 IMPLANT
SUT MNCRL AB 4-0 PS2 18 (SUTURE) ×2 IMPLANT
SUT STRATAFIX PDS+ 0 24IN (SUTURE) ×2 IMPLANT
SUT VIC AB 1 CT1 36 (SUTURE) ×2 IMPLANT
SUT VIC AB 2-0 CT1 27 (SUTURE) ×6
SUT VIC AB 2-0 CT1 TAPERPNT 27 (SUTURE) ×3 IMPLANT
SYR 3ML LL SCALE MARK (SYRINGE) ×2 IMPLANT
TIBIAL BASE ROT PLAT SZ 3 KNEE (Knees) ×2 IMPLANT
TRAY FOLEY MTR SLVR 16FR STAT (SET/KITS/TRAYS/PACK) ×2 IMPLANT
WATER STERILE IRR 1000ML POUR (IV SOLUTION) ×4 IMPLANT
WRAP KNEE MAXI GEL POST OP (GAUZE/BANDAGES/DRESSINGS) ×2 IMPLANT
YANKAUER SUCT BULB TIP 10FT TU (MISCELLANEOUS) ×2 IMPLANT

## 2019-03-11 NOTE — Anesthesia Procedure Notes (Signed)
Spinal  Patient location during procedure: OR Start time: 03/11/2019 7:19 AM End time: 03/11/2019 7:23 AM Staffing Anesthesiologist: Eilene Ghazi, MD Preanesthetic Checklist Completed: patient identified, IV checked, site marked, risks and benefits discussed, surgical consent, monitors and equipment checked, pre-op evaluation and timeout performed Spinal Block Patient position: sitting Prep: DuraPrep Patient monitoring: heart rate, cardiac monitor, continuous pulse ox and blood pressure Approach: midline Location: L3-4 Injection technique: single-shot Needle Needle type: Sprotte  Needle gauge: 24 G Needle length: 9 cm Assessment Sensory level: T4

## 2019-03-11 NOTE — Anesthesia Procedure Notes (Signed)
Anesthesia Regional Block: Adductor canal block   Pre-Anesthetic Checklist: ,, timeout performed, Correct Patient, Correct Site, Correct Laterality, Correct Procedure, Correct Position, site marked, Risks and benefits discussed,  Surgical consent,  Pre-op evaluation,  At surgeon's request and post-op pain management  Laterality: Right  Prep: chloraprep       Needles:  Injection technique: Single-shot  Needle Type: Echogenic Needle     Needle Length: 9cm      Additional Needles:   Procedures:,,,, ultrasound used (permanent image in chart),,,,  Narrative:  Start time: 03/11/2019 6:58 AM End time: 03/11/2019 7:04 AM Injection made incrementally with aspirations every 5 mL.  Performed by: Personally  Anesthesiologist: Eilene Ghazi, MD  Additional Notes: Patient tolerated the procedure well without complications

## 2019-03-11 NOTE — Anesthesia Postprocedure Evaluation (Signed)
Anesthesia Post Note  Patient: Vicki Turner  Procedure(s) Performed: RIGHT TOTAL KNEE ARTHROPLASTY (Right Knee)     Patient location during evaluation: PACU Anesthesia Type: Spinal Level of consciousness: oriented and awake and alert Pain management: pain level controlled Vital Signs Assessment: post-procedure vital signs reviewed and stable Respiratory status: spontaneous breathing, respiratory function stable and patient connected to nasal cannula oxygen Cardiovascular status: blood pressure returned to baseline and stable Postop Assessment: no headache, no backache and no apparent nausea or vomiting Anesthetic complications: no    Last Vitals:  Vitals:   03/11/19 1115 03/11/19 1130  BP: 110/72 113/61  Pulse: (!) 55 (!) 51  Resp: 17 15  Temp:    SpO2: 95% 93%    Last Pain:  Vitals:   03/11/19 1130  TempSrc:   PainSc: 0-No pain                 Nickolus Wadding S

## 2019-03-11 NOTE — Evaluation (Signed)
Physical Therapy Evaluation Patient Details Name: Vicki Turner MRN: [id number] DOB: 30-Oct-1950 Today's Date: 03/11/2019   History of Present Illness  R TKA 03/11/19; PMH of L TKA 2017  Clinical Impression  Pt ambulated 120' with RW, no loss of balance. Stair training completed, pt demonstrates good understanding of HEP. She is ready to DC home from PT standpoint.    Follow Up Recommendations Outpatient PT;Follow surgeon's recommendation for DC plan and follow-up therapies    Equipment Recommendations  None recommended by PT    Recommendations for Other Services       Precautions / Restrictions Precautions Precautions: Knee Precaution Booklet Issued: Yes (comment) Precaution Comments: reviewed no pillow under knee Restrictions Weight Bearing Restrictions: No Other Position/Activity Restrictions: WBAT      Mobility  Bed Mobility Overal bed mobility: Needs Assistance Bed Mobility: Supine to Sit     Supine to sit: Min assist     General bed mobility comments: min A to support RLE, instructed pt to self assist RLE with LLE  Transfers Overall transfer level: Needs assistance Equipment used: Rolling walker (2 wheeled) Transfers: Sit to/from Stand Sit to Stand: Min guard         General transfer comment: VCs hand placement  Ambulation/Gait Ambulation/Gait assistance: Min guard Gait Distance (Feet): 120 Feet Assistive device: Rolling walker (2 wheeled) Gait Pattern/deviations: Step-to pattern;Decreased stride length;Trunk flexed Gait velocity: decr   General Gait Details: VCs sequencing and for posture  Stairs Stairs: Yes Stairs assistance: Min guard Stair Management: Step to pattern;Forwards;One rail Right;With cane Number of Stairs: 3 General stair comments: VCs sequencing  Wheelchair Mobility    Modified Rankin (Stroke Patients Only)       Balance Overall balance assessment: Modified Independent                                           Pertinent Vitals/Pain Pain Assessment: (P) 0-10 Pain Score: (P) 8  Pain Location: (P) R knee Pain Descriptors / Indicators: (P) Sore Pain Intervention(s): (P) Limited activity within patient's tolerance;Monitored during session;Premedicated before session;Ice applied    Home Living Family/patient expects to be discharged to:: Private residence Living Arrangements: Spouse/significant other Available Help at Discharge: Family Type of Home: House Home Access: Stairs to enter Entrance Stairs-Rails: Right Entrance Stairs-Number of Steps: 3 Home Layout: One level Home Equipment: Environmental consultant - 2 wheels;Cane - single point      Prior Function Level of Independence: Independent               Hand Dominance        Extremity/Trunk Assessment   Upper Extremity Assessment Upper Extremity Assessment: Overall WFL for tasks assessed    Lower Extremity Assessment Lower Extremity Assessment: RLE deficits/detail RLE Deficits / Details: SLR 2/5, knee AAROM 5-45* RLE Sensation: decreased light touch RLE Coordination: WNL    Cervical / Trunk Assessment Cervical / Trunk Assessment: Normal  Communication   Communication: No difficulties  Cognition Arousal/Alertness: Awake/alert Behavior During Therapy: WFL for tasks assessed/performed Overall Cognitive Status: Within Functional Limits for tasks assessed                                        General Comments      Exercises Total Joint Exercises Ankle Circles/Pumps: AROM;Both;10 reps;Supine Quad Sets: AROM;Right;5  reps;Supine Short Arc Quad: Right;AAROM;5 reps;Supine Heel Slides: AAROM;Right;10 reps;Supine Hip ABduction/ADduction: AAROM;Right;5 reps;Supine Straight Leg Raises: AAROM;Right;5 reps;Supine Goniometric ROM: 5-45* AAROM R knee   Assessment/Plan    PT Assessment All further PT needs can be met in the next venue of care  PT Problem List Decreased strength;Decreased range of motion;Decreased  activity tolerance;Decreased knowledge of use of DME;Decreased mobility;Pain       PT Treatment Interventions      PT Goals (Current goals can be found in the Care Plan section)  Acute Rehab PT Goals Patient Stated Goal: hold grandkids, walk normally PT Goal Formulation: All assessment and education complete, DC therapy    Frequency     Barriers to discharge        Co-evaluation               AM-PAC PT "6 Clicks" Mobility  Outcome Measure Help needed turning from your back to your side while in a flat bed without using bedrails?: A Little Help needed moving from lying on your back to sitting on the side of a flat bed without using bedrails?: A Little Help needed moving to and from a bed to a chair (including a wheelchair)?: A Little Help needed standing up from a chair using your arms (e.g., wheelchair or bedside chair)?: A Little Help needed to walk in hospital room?: A Little Help needed climbing 3-5 steps with a railing? : A Little 6 Click Score: 18    End of Session Equipment Utilized During Treatment: Gait belt Activity Tolerance: Patient tolerated treatment well Patient left: in bed;with call bell/phone within reach;with nursing/sitter in room Nurse Communication: Mobility status PT Visit Diagnosis: Muscle weakness (generalized) (M62.81);Difficulty in walking, not elsewhere classified (R26.2);Pain Pain - Right/Left: Right Pain - part of body: Knee    Time: 0383-3383 PT Time Calculation (min) (ACUTE ONLY): 62 min   Charges:   PT Evaluation $PT Eval Low Complexity: 1 Low PT Treatments $Gait Training: 23-37 mins $Therapeutic Exercise: 8-22 mins        Blondell Reveal Kistler PT 03/11/2019  Acute Rehabilitation Services Pager [phone] Office [phone]

## 2019-03-11 NOTE — Anesthesia Procedure Notes (Signed)
Procedure Name: MAC Date/Time: 03/11/2019 7:20 AM Performed by: Eben Burow, CRNA Pre-anesthesia Checklist: Patient identified, Emergency Drugs available, Suction available, Patient being monitored and Timeout performed Oxygen Delivery Method: Simple face mask Dental Injury: Teeth and Oropharynx as per pre-operative assessment

## 2019-03-11 NOTE — Anesthesia Procedure Notes (Signed)
Anesthesia Procedure Image    

## 2019-03-11 NOTE — Op Note (Signed)
NAME:  Vicki Turner [facility]                      MEDICAL RECORD NO.:  [id number]                             FACILITY:  [facility]      PHYSICIAN:  Pietro Cassis. Alvan Dame, M.D.  DATE OF BIRTH:  1950-11-09      DATE OF PROCEDURE:  03/11/2019                                     OPERATIVE REPORT         PREOPERATIVE DIAGNOSIS:  Right knee osteoarthritis.      POSTOPERATIVE DIAGNOSIS:  Right knee osteoarthritis.      FINDINGS:  The patient was noted to have complete loss of cartilage and   bone-on-bone arthritis with associated osteophytes in the lateral and patellofemoral compartments of   the knee with a significant synovitis and associated effusion.  The patient had failed months of conservative treatment including medications, injection therapy, activity modification.     PROCEDURE:  Right total knee replacement.      COMPONENTS USED:  DePuy Attune rotating platform posterior stabilized knee   system, a size 4N femur, 3 tibia, size 7 mm PS AOX insert, and 35 anatomic patellar   button.      SURGEON:  Pietro Cassis. Alvan Dame, M.D.      ASSISTANT:  Griffith Citron, PA-C.      ANESTHESIA:  Regional and Spinal.      SPECIMENS:  None.      COMPLICATION:  None.      DRAINS:  None.  EBL: <100cc      TOURNIQUET TIME:   Total Tourniquet Time Documented: Thigh (Right) - 25 minutes Total: Thigh (Right) - 25 minutes  .      The patient was stable to the recovery room.      INDICATION FOR PROCEDURE:  Vicki Turner is a 69 y.o. female patient of   mine.  The patient had been seen, evaluated, and treated for months conservatively in the   office with medication, activity modification, and injections.  The patient had   radiographic changes of bone-on-bone arthritis with endplate sclerosis and osteophytes noted.  Based on the radiographic changes and failed conservative measures, the patient   decided to proceed with definitive treatment, total knee replacement.  Risks of infection, DVT, component failure,  need for revision surgery, neurovascular injury were reviewed in the office setting.  The postop course was reviewed stressing the efforts to maximize post-operative satisfaction and function.  Consent was obtained for benefit of pain   relief.      PROCEDURE IN DETAIL:  The patient was brought to the operative theater.   Once adequate anesthesia, preoperative antibiotics, 2 gm of Ancef, 1gm of Vancomycin (MRSA positive screen),1 gm of Tranexamic Acid, and 10 mg of Decadron administered, the patient was positioned supine with a right thigh tourniquet placed.  The  right lower extremity was prepped and draped in sterile fashion.  A time-   out was performed identifying the patient, planned procedure, and the appropriate extremity.      The right lower extremity was placed in the [facility] leg holder.  The leg was   exsanguinated, tourniquet elevated to 250 mmHg.  A  midline incision was   made followed by median parapatellar arthrotomy.  Following initial   exposure, attention was first directed to the patella.  Precut   measurement was noted to be 21 mm.  I resected down to 13 mm and used a   35 anatomic patellar button to restore patellar height as well as cover the cut surface.      The lug holes were drilled and a metal shim was placed to protect the   patella from retractors and saw blade during the procedure.      At this point, attention was now directed to the femur.  The femoral   canal was opened with a drill, irrigated to try to prevent fat emboli.  An   intramedullary rod was passed at 3 degrees valgus, 9 mm of bone was   resected off the distal femur.  Following this resection, the tibia was   subluxated anteriorly.  Using the extramedullary guide, 3 mm of bone was resected off   the proximal medial tibia.  We confirmed the gap would be   stable medially and laterally with a size 6 spacer block as well as confirmed that the tibial cut was perpendicular in the coronal plane, checking  with an alignment rod.      Once this was done, I sized the femur to be a size 4 in the anterior-   posterior dimension, chose a narrow component based on medial and   lateral dimension.  The size 4 rotation block was then pinned in   position anterior referenced using the C-clamp to set rotation.  The   anterior, posterior, and  chamfer cuts were made without difficulty nor   notching making certain that I was along the anterior cortex to help   with flexion gap stability.      The final box cut was made off the lateral aspect of distal femur.      At this point, the tibia was sized to be a size 3.  The size 3 tray was   then pinned in position through the medial third of the tubercle,   drilled, and keel punched.  Trial reduction was now carried with a 4 femur,  3 tibia, a size 7 mm PS insert, and the 35 anatomic patella botton.  The knee was brought to full extension with good flexion stability with the patella   tracking through the trochlea without application of pressure.  Given   all these findings the trial components removed.  Final components were   opened and cement was mixed.  The knee was irrigated with normal saline solution and pulse lavage.  The synovial lining was   then injected with 30 cc of 0.25% Marcaine with epinephrine, 1 cc of Toradol and 30 cc of NS for a total of 61 cc.     Final implants were then cemented onto cleaned and dried cut surfaces of bone with the knee brought to extension with a size 7 mm PS trial insert.      Once the cement had fully cured, excess cement was removed   throughout the knee.  I confirmed that I was satisfied with the range of   motion and stability, and the final size 7 mm PS AOX insert was chosen.  It was   placed into the knee.      The tourniquet had been let down at 25 minutes.  No significant   hemostasis was required.  The extensor mechanism was  then reapproximated using #1 Vicryl and #1 Stratafix sutures with the knee   in  flexion.  The   remaining wound was closed with 2-0 Vicryl and running 4-0 Monocryl.   The knee was cleaned, dried, dressed sterilely using Dermabond and   Aquacel dressing.  The patient was then   brought to recovery room in stable condition, tolerating the procedure   well.   Please note that Physician Assistant, Dennie Bible, PA-C was present for the entirety of the case, and was utilized for pre-operative positioning, peri-operative retractor management, general facilitation of the procedure and for primary wound closure at the end of the case.              Madlyn Frankel Charlann Boxer, M.D.    03/11/2019 8:32 AM

## 2019-03-11 NOTE — Interval H&P Note (Signed)
History and Physical Interval Note:  03/11/2019 7:09 AM  Vicki Turner  has presented today for surgery, with the diagnosis of Right knee osteoarthritis.  The various methods of treatment have been discussed with the patient and family. After consideration of risks, benefits and other options for treatment, the patient has consented to  Procedure(s) with comments: TOTAL KNEE ARTHROPLASTY (Right) - 70 mins as a surgical intervention.  The patient's history has been reviewed, patient examined, no change in status, stable for surgery.  I have reviewed the patient's chart and labs.  Questions were answered to the patient's satisfaction.     Shelda Pal

## 2019-03-11 NOTE — Transfer of Care (Signed)
Immediate Anesthesia Transfer of Care Note  Patient: Vicki Turner  Procedure(s) Performed: RIGHT TOTAL KNEE ARTHROPLASTY (Right Knee)  Patient Location: PACU  Anesthesia Type:Spinal  Level of Consciousness: awake, alert  and oriented  Airway & Oxygen Therapy: Patient Spontanous Breathing and Patient connected to face mask oxygen  Post-op Assessment: Report given to RN and Post -op Vital signs reviewed and stable  Post vital signs: Reviewed and stable  Last Vitals:  Vitals Value Taken Time  BP 95/78 03/11/19 0903  Temp    Pulse 50 03/11/19 0905  Resp 16 03/11/19 0905  SpO2 100 % 03/11/19 0905  Vitals shown include unvalidated device data.  Last Pain:  Vitals:   03/11/19 0609  TempSrc: Oral  PainSc:       Patients Stated Pain Goal: 4 (03/11/19 0606)  Complications: No apparent anesthesia complications

## 2019-03-11 NOTE — Progress Notes (Signed)
Pt and husband given discharge instructions. All questions answered. Pt in NAD, VSS, pain tolerable.

## 2019-03-11 NOTE — Anesthesia Preprocedure Evaluation (Addendum)
Anesthesia Evaluation  Patient identified by MRN, date of birth, ID band Patient awake    Reviewed: Allergy & Precautions, NPO status , Patient's Chart, lab work & pertinent test results  Airway Mallampati: II  TM Distance: >3 FB Neck ROM: Full    Dental no notable dental hx.    Pulmonary neg pulmonary ROS,    Pulmonary exam normal breath sounds clear to auscultation       Cardiovascular negative cardio ROS Normal cardiovascular exam Rhythm:Regular Rate:Normal     Neuro/Psych negative neurological ROS  negative psych ROS   GI/Hepatic negative GI ROS, Neg liver ROS,   Endo/Other  negative endocrine ROS  Renal/GU negative Renal ROS  negative genitourinary   Musculoskeletal  (+) Arthritis , Osteoarthritis,    Abdominal   Peds negative pediatric ROS (+)  Hematology negative hematology ROS (+)   Anesthesia Other Findings   Reproductive/Obstetrics negative OB ROS                             Anesthesia Physical Anesthesia Plan  ASA: II  Anesthesia Plan: Spinal   Post-op Pain Management:  Regional for Post-op pain   Induction: Intravenous  PONV Risk Score and Plan: 3 and Ondansetron, Dexamethasone and Treatment may vary due to age or medical condition  Airway Management Planned: Simple Face Mask  Additional Equipment:   Intra-op Plan:   Post-operative Plan:   Informed Consent: I have reviewed the patients History and Physical, chart, labs and discussed the procedure including the risks, benefits and alternatives for the proposed anesthesia with the patient or authorized representative who has indicated his/her understanding and acceptance.     Dental advisory given  Plan Discussed with: CRNA and Surgeon  Anesthesia Plan Comments:         Anesthesia Quick Evaluation

## 2019-03-11 NOTE — Discharge Instructions (Signed)

## 2019-03-12 ENCOUNTER — Encounter: Payer: Self-pay | Admitting: *Deleted

## 2019-04-13 ENCOUNTER — Other Ambulatory Visit: Payer: Self-pay | Admitting: Obstetrics & Gynecology

## 2019-04-14 NOTE — Telephone Encounter (Signed)
Medication refill request: Gabapentin  Last AEX:  03/07/18  Next AEX: 07/10/19  Last MMG (if hormonal medication request): 04/02/18 Bi-rads 1 Neg  Refill authorized: #90 with 0    Medication refill request: Celexa  Last AEX:  03/07/18  Next AEX: 07/10/19  Last MMG (if hormonal medication request): 04/02/18 Bi-rads 1 Neg  Refill authorized: #90 with 0

## 2019-04-23 DIAGNOSIS — Z471 Aftercare following joint replacement surgery: Secondary | ICD-10-CM | POA: Diagnosis not present

## 2019-04-23 DIAGNOSIS — Z96651 Presence of right artificial knee joint: Secondary | ICD-10-CM | POA: Diagnosis not present

## 2019-04-30 ENCOUNTER — Other Ambulatory Visit: Payer: Self-pay | Admitting: Obstetrics & Gynecology

## 2019-04-30 DIAGNOSIS — Z1231 Encounter for screening mammogram for malignant neoplasm of breast: Secondary | ICD-10-CM

## 2019-06-09 ENCOUNTER — Other Ambulatory Visit: Payer: Self-pay

## 2019-06-10 ENCOUNTER — Ambulatory Visit (INDEPENDENT_AMBULATORY_CARE_PROVIDER_SITE_OTHER): Payer: Medicare PPO | Admitting: Obstetrics & Gynecology

## 2019-06-10 ENCOUNTER — Encounter: Payer: Self-pay | Admitting: Obstetrics & Gynecology

## 2019-06-10 VITALS — BP 150/84 | HR 60 | Temp 97.0°F | Resp 16 | Ht 66.0 in | Wt 160.0 lb

## 2019-06-10 DIAGNOSIS — Z01419 Encounter for gynecological examination (general) (routine) without abnormal findings: Secondary | ICD-10-CM

## 2019-06-10 MED ORDER — GABAPENTIN 300 MG PO CAPS: 300.0000 mg | ORAL_CAPSULE | Freq: Every day | ORAL | 4 refills | Status: DC

## 2019-06-10 MED ORDER — CITALOPRAM HYDROBROMIDE 20 MG PO TABS: 20.0000 mg | ORAL_TABLET | Freq: Every day | ORAL | 4 refills | Status: AC

## 2019-06-10 NOTE — Progress Notes (Addendum)
69 y.o. G49P2002 Married White or Caucasian female here for annual exam.  Denies vaginal bleeding.  Denies bladder issues.    Patient had Rt.knee replacement 1/2021with Dr. Charlann Boxer.  She is done with PT.  She is doing full body arm/leg machine for exercise.  Has received Covid vaccination with [employer].  Did well with the vaccination.    She has experienced some night sweats but this is manageable.    Patient's last menstrual period was 02/20/1986 (approximate).          Sexually active: No.  The current method of family planning is status post hysterectomy.    Exercising: Yes.    working out in gym Smoker:  no  Health Maintenance: Pap:  12/03/06 Neg  History of abnormal Pap:  no MMG: 04-02-18 3D/Neg/density B/BiRads1-- appt. 06-24-19 Colonoscopy:07-23-17 diverticulosis/Neg;next 07/2027  BMD: 04-02-18 Osteopenia TDaP: pt is sure this is UTD Pneumonia vaccine(s):  2012 Shingrix:  2019 Hep C testing: 12-22-16 Neg Screening Labs: done with Dr. Clarene Duke   reports that she has never smoked. She has never used smokeless tobacco. She reports current alcohol use of about 5.0 standard drinks of alcohol per week. She reports that she does not use drugs.  Past Medical History:  Diagnosis Date  . Arthritis    left knee  . Family history of adverse reaction to anesthesia    pt daughter very nauseous with anesthesia.  . H/O seasonal allergies    uses OTC med  . Headache    migaines-takes Nadolol for this  . History of anemia    resolved with hysterectomy  . History of endometriosis   . History of migraine   . History of pneumonia 1983  . Osteopenia   . Recurrent sinus infections    last Spring 2017     Past Surgical History:  Procedure Laterality Date  . ANTERIOR AND POSTERIOR REPAIR N/A 01/01/2017   Procedure: ANTERIOR (CYSTOCELE) AND POSTERIOR REPAIR (RECTOCELE);  Surgeon: Patton Salles, MD;  Location: [facility];  Service: Gynecology;  Laterality: N/A;  . APPENDECTOMY    . BLADDER  SUSPENSION N/A 01/01/2017   Procedure: TRANSVAGINAL TAPE (TVT) PROCEDURE mid-urethral sling;  Surgeon: Patton Salles, MD;  Location: [facility];  Service: Gynecology;  Laterality: N/A;  . CYSTOSCOPY N/A 01/01/2017   Procedure: CYSTOSCOPY;  Surgeon: Patton Salles, MD;  Location: [facility];  Service: Gynecology;  Laterality: N/A;  . LAPAROSCOPIC APPENDECTOMY N/A 12/10/2015   Procedure: APPENDECTOMY LAPAROSCOPIC;  Surgeon: Luretha Murphy, MD;  Location: [facility];  Service: General;  Laterality: N/A;  . NASAL SINUS SURGERY  2002   again in 2002, revision  . ROBOTIC ASSISTED BILATERAL SALPINGO OOPHERECTOMY  01/01/2017   Procedure: ROBOTIC ASSISTED BILATERAL SALPINGO OOPHORECTOMY, Extensive Lysis of Adhesions;  Surgeon: Patton Salles, MD;  Location: [facility];  Service: Gynecology;;  . ROBOTIC ASSISTED LAPAROSCOPIC SACROCOLPOPEXY N/A 01/01/2017   Procedure: ROBOTIC ASSISTED LAPAROSCOPIC SACROCOLPOPEXY;  Surgeon: Patton Salles, MD;  Location: [facility];  Service: Gynecology;  Laterality: N/A;  . TOTAL ABDOMINAL HYSTERECTOMY  1998   DUB/endometriosis  . TOTAL KNEE ARTHROPLASTY Left 11/30/2015   Procedure: LEFT TOTAL KNEE ARTHROPLASTY;  Surgeon: Durene Romans, MD;  Location: [facility];  Service: Orthopedics;  Laterality: Left;  Adductor Block  . TOTAL KNEE ARTHROPLASTY Right 03/11/2019   Procedure: RIGHT TOTAL KNEE ARTHROPLASTY;  Surgeon: Durene Romans, MD;  Location: [facility];  Service: Orthopedics;  Laterality: Right;  . WISDOM  TOOTH EXTRACTION      Current Outpatient Medications  Medication Sig Dispense Refill  . citalopram (CELEXA) 20 MG tablet Take 1 tablet (20 mg total) by mouth at bedtime. 90 tablet 0  . clobetasol (TEMOVATE) 0.05 % external solution Apply 1 application topically at bedtime as needed (APPLY TO SCALP 1 TO 2 TIMES A DAY FOR FLARES).     . famotidine (PEPCID) 20 MG tablet Take 20 mg by mouth 2 (two) times daily as needed for heartburn or indigestion.     . fluticasone (FLONASE) 50 MCG/ACT nasal spray Place 1 spray into both nostrils daily.     Marland Kitchen gabapentin (NEURONTIN) 300 MG capsule TAKE 1 CAPSULE BY MOUTH EVERYDAY AT BEDTIME 90 capsule 0  . loratadine (CLARITIN) 10 MG tablet Take 10 mg by mouth daily.    . nadolol (CORGARD) 20 MG tablet TAKE 1 TABLET (20 MG TOTAL) BY MOUTH DAILY. (Patient taking differently: Take 20 mg by mouth at bedtime. TAKE 1 TABLET (20 MG TOTAL) BY MOUTH DAILY.) 90 tablet 0  . SUMAtriptan (IMITREX) 100 MG tablet Take 1 tablet (100 mg total) by mouth every 2 (two) hours as needed for migraine. 9 tablet 4   No current facility-administered medications for this visit.    Family History  Problem Relation Age of Onset  . Hypertension Maternal Grandfather   . Heart attack Father   . Miscarriages / Stillbirths Sister   . Transient ischemic attack Maternal Grandmother   . Osteoarthritis Mother   . Kidney disease Brother     Review of Systems  All other systems reviewed and are negative.   Exam:   BP (!) 150/84   Pulse 60   Temp (!) 97 F (36.1 C) (Temporal)   Resp 16   Ht 5\' 6"  (1.676 m)   Wt 160 lb (72.6 kg)   LMP 02/20/1986 (Approximate)   BMI 25.82 kg/m    Height: 5\' 6"  (167.6 cm)  Ht Readings from Last 3 Encounters:  06/10/19 5\' 6"  (1.676 m)  03/11/19 5' 5.5" (1.664 m)  03/07/19 5' 5.5" (1.664 m)    General appearance: alert, cooperative and appears stated age Head: Normocephalic, without obvious abnormality, atraumatic Neck: no adenopathy, supple, symmetrical, trachea midline and thyroid normal to inspection and palpation Lungs: clear to auscultation bilaterally Breasts: normal appearance, no masses or tenderness Heart: regular rate and rhythm Abdomen: soft, non-tender; bowel sounds normal; no masses,  no organomegaly Extremities: extremities normal, atraumatic, no cyanosis or edema Skin: Skin color, texture, turgor normal. No rashes or lesions Lymph nodes: Cervical, supraclavicular, and axillary  nodes normal. No abnormal inguinal nodes palpated Neurologic: Grossly normal   Pelvic: External genitalia:  no lesions              Urethra:  normal appearing urethra with no masses, tenderness or lesions              Bartholins and Skenes: normal                 Vagina: normal appearing vagina with normal color and discharge, no lesions              Cervix: absent              Pap taken: No. Bimanual Exam:  Uterus:  uterus absent              Adnexa: no mass, fullness, tenderness               Rectovaginal:  Confirms               Anus:  normal sphincter tone, no lesions  Chaperone, Terence Lux, CMA, was present for exam.  A:  Well Woman with normal exam PMP, no HRT H/o TAH due to endometriosis and DUB Later BSO and A&P repair, colpopexy and cystoscopy Hot flashes/night sweats improved with Gabapentin and Celexa Elevated BP today  P:   Mammogram guidelines reviewed.  She has this scheduled 06/24/2019. pap smear not indicated RF for Celex 20mg  daily.  #90/4RF Gabapentin 300mg  nightly.  #90/4RF Lab work done with PCP Pt is going to get BP cuff and check in AM at home over next few days and give me update about this. Return annually or prn

## 2019-06-24 ENCOUNTER — Ambulatory Visit
Admission: RE | Admit: 2019-06-24 | Discharge: 2019-06-24 | Disposition: A | Discharge status: Home / Self Care | Payer: Medicare PPO | Source: Ambulatory Visit | Attending: Obstetrics & Gynecology | Admitting: Obstetrics & Gynecology

## 2019-06-24 ENCOUNTER — Other Ambulatory Visit: Payer: Self-pay

## 2019-06-24 DIAGNOSIS — Z1231 Encounter for screening mammogram for malignant neoplasm of breast: Secondary | ICD-10-CM | POA: Diagnosis not present

## 2019-06-26 DIAGNOSIS — E871 Hypo-osmolality and hyponatremia: Secondary | ICD-10-CM | POA: Diagnosis not present

## 2019-06-26 DIAGNOSIS — I10 Essential (primary) hypertension: Secondary | ICD-10-CM | POA: Diagnosis not present

## 2019-07-10 ENCOUNTER — Ambulatory Visit: Payer: Medicare Other | Admitting: Obstetrics & Gynecology

## 2019-07-10 DIAGNOSIS — L578 Other skin changes due to chronic exposure to nonionizing radiation: Secondary | ICD-10-CM | POA: Diagnosis not present

## 2019-07-10 DIAGNOSIS — L719 Rosacea, unspecified: Secondary | ICD-10-CM | POA: Diagnosis not present

## 2019-07-10 DIAGNOSIS — B078 Other viral warts: Secondary | ICD-10-CM | POA: Diagnosis not present

## 2019-07-10 DIAGNOSIS — H61002 Unspecified perichondritis of left external ear: Secondary | ICD-10-CM | POA: Diagnosis not present

## 2019-07-16 DIAGNOSIS — N390 Urinary tract infection, site not specified: Secondary | ICD-10-CM | POA: Diagnosis not present

## 2019-08-06 DIAGNOSIS — H61002 Unspecified perichondritis of left external ear: Secondary | ICD-10-CM | POA: Diagnosis not present

## 2019-08-18 DIAGNOSIS — N3001 Acute cystitis with hematuria: Secondary | ICD-10-CM | POA: Diagnosis not present

## 2019-08-18 DIAGNOSIS — R3915 Urgency of urination: Secondary | ICD-10-CM | POA: Diagnosis not present

## 2019-08-18 DIAGNOSIS — R300 Dysuria: Secondary | ICD-10-CM | POA: Diagnosis not present

## 2019-08-22 DIAGNOSIS — R300 Dysuria: Secondary | ICD-10-CM | POA: Diagnosis not present

## 2019-08-22 DIAGNOSIS — N3001 Acute cystitis with hematuria: Secondary | ICD-10-CM | POA: Diagnosis not present

## 2019-08-22 DIAGNOSIS — R3915 Urgency of urination: Secondary | ICD-10-CM | POA: Diagnosis not present

## 2019-08-27 ENCOUNTER — Ambulatory Visit: Payer: Medicare PPO | Admitting: Obstetrics & Gynecology

## 2019-08-27 ENCOUNTER — Telehealth: Payer: Self-pay

## 2019-08-27 ENCOUNTER — Encounter: Payer: Self-pay | Admitting: Obstetrics & Gynecology

## 2019-08-27 ENCOUNTER — Other Ambulatory Visit: Payer: Self-pay

## 2019-08-27 ENCOUNTER — Other Ambulatory Visit: Payer: Self-pay | Admitting: Obstetrics & Gynecology

## 2019-08-27 VITALS — BP 128/76 | HR 64 | Resp 10 | Ht 66.0 in | Wt 160.0 lb

## 2019-08-27 DIAGNOSIS — N6310 Unspecified lump in the right breast, unspecified quadrant: Secondary | ICD-10-CM

## 2019-08-27 DIAGNOSIS — N63 Unspecified lump in unspecified breast: Secondary | ICD-10-CM

## 2019-08-27 NOTE — Telephone Encounter (Signed)
-----   Message from Jerene Bears, MD sent at 08/27/2019 10:23 AM EDT ----- Regarding: diagnostic right mmg Please schedule diagnostic right breast MMG for this pt due to breast lesion that is tendern.  At 10 o'clock on the right breast.    Thanks.

## 2019-08-27 NOTE — Progress Notes (Signed)
GYNECOLOGY  VISIT  CC:   Patient complains of feeling a "knot" in right breast that is tender.  HPI: 69 y.o. G22P2002 Married White or Caucasian female here for breast lump that she noticed this morning.  She denies any trauma.  Denies nipple discharge.  She did have a normal mammogram in May, 2021.  Reviewed this today.  Husband was very anxious for her and wanted her to be seen.    GYNECOLOGIC HISTORY: Patient's last menstrual period was 02/20/1986 (approximate). Contraception: Hysterectomy Menopausal hormone therapy: none  Patient Active Problem List   Diagnosis Date Noted  . S/P right TKA 03/11/2019  . Status post surgery 01/01/2017  . S/P laparoscopic appendectomyOct 2017 12/10/2015  . Acute appendicitis 12/10/2015  . Overweight (BMI 25.0-29.9) 12/01/2015  . S/P left TKA 11/30/2015  . Migraine, unspecified, without mention of intractable migraine without mention of status migrainosus 09/02/2013  . History of endometriosis 09/02/2013    Past Medical History:  Diagnosis Date  . Arthritis    left knee  . Family history of adverse reaction to anesthesia    pt daughter very nauseous with anesthesia.  . H/O seasonal allergies    uses OTC med  . Headache    migaines-takes Nadolol for this  . History of anemia    resolved with hysterectomy  . History of endometriosis   . History of migraine   . History of pneumonia 1983  . Osteopenia   . Recurrent sinus infections    last Spring 2017     Past Surgical History:  Procedure Laterality Date  . ANTERIOR AND POSTERIOR REPAIR N/A 01/01/2017   Procedure: ANTERIOR (CYSTOCELE) AND POSTERIOR REPAIR (RECTOCELE);  Surgeon: Patton Salles, MD;  Location: [facility];  Service: Gynecology;  Laterality: N/A;  . APPENDECTOMY    . BLADDER SUSPENSION N/A 01/01/2017   Procedure: TRANSVAGINAL TAPE (TVT) PROCEDURE mid-urethral sling;  Surgeon: Patton Salles, MD;  Location: [facility];  Service: Gynecology;  Laterality: N/A;  .  CYSTOSCOPY N/A 01/01/2017   Procedure: CYSTOSCOPY;  Surgeon: Patton Salles, MD;  Location: [facility];  Service: Gynecology;  Laterality: N/A;  . LAPAROSCOPIC APPENDECTOMY N/A 12/10/2015   Procedure: APPENDECTOMY LAPAROSCOPIC;  Surgeon: Luretha Murphy, MD;  Location: [facility];  Service: General;  Laterality: N/A;  . NASAL SINUS SURGERY  2002   again in 2002, revision  . ROBOTIC ASSISTED BILATERAL SALPINGO OOPHERECTOMY  01/01/2017   Procedure: ROBOTIC ASSISTED BILATERAL SALPINGO OOPHORECTOMY, Extensive Lysis of Adhesions;  Surgeon: Patton Salles, MD;  Location: [facility];  Service: Gynecology;;  . ROBOTIC ASSISTED LAPAROSCOPIC SACROCOLPOPEXY N/A 01/01/2017   Procedure: ROBOTIC ASSISTED LAPAROSCOPIC SACROCOLPOPEXY;  Surgeon: Patton Salles, MD;  Location: [facility];  Service: Gynecology;  Laterality: N/A;  . TOTAL ABDOMINAL HYSTERECTOMY  1998   DUB/endometriosis  . TOTAL KNEE ARTHROPLASTY Left 11/30/2015   Procedure: LEFT TOTAL KNEE ARTHROPLASTY;  Surgeon: Durene Romans, MD;  Location: [facility];  Service: Orthopedics;  Laterality: Left;  Adductor Block  . TOTAL KNEE ARTHROPLASTY Right 03/11/2019   Procedure: RIGHT TOTAL KNEE ARTHROPLASTY;  Surgeon: Durene Romans, MD;  Location: [facility];  Service: Orthopedics;  Laterality: Right;  . WISDOM TOOTH EXTRACTION      MEDS:   Current Outpatient Medications on File Prior to Visit  Medication Sig Dispense Refill  . citalopram (CELEXA) 20 MG tablet Take 1 tablet (20 mg total) by mouth at bedtime. 90 tablet 4  . clobetasol (TEMOVATE)  0.05 % external solution Apply 1 application topically at bedtime as needed (APPLY TO SCALP 1 TO 2 TIMES A DAY FOR FLARES).     . famotidine (PEPCID) 20 MG tablet Take 20 mg by mouth 2 (two) times daily as needed for heartburn or indigestion.    . fluticasone (FLONASE) 50 MCG/ACT nasal spray Place 1 spray into both nostrils daily.     Marland Kitchen gabapentin (NEURONTIN) 300 MG capsule Take 1 capsule (300 mg total) by  mouth at bedtime. 90 capsule 4  . loratadine (CLARITIN) 10 MG tablet Take 10 mg by mouth daily.    Marland Kitchen losartan (COZAAR) 25 MG tablet     . nadolol (CORGARD) 20 MG tablet TAKE 1 TABLET (20 MG TOTAL) BY MOUTH DAILY. (Patient taking differently: Take 20 mg by mouth at bedtime. TAKE 1 TABLET (20 MG TOTAL) BY MOUTH DAILY.) 90 tablet 0  . SUMAtriptan (IMITREX) 100 MG tablet Take 1 tablet (100 mg total) by mouth every 2 (two) hours as needed for migraine. 9 tablet 4   No current facility-administered medications on file prior to visit.    ALLERGIES: Novocain [procaine]  Family History  Problem Relation Age of Onset  . Hypertension Maternal Grandfather   . Heart attack Father   . Miscarriages / Stillbirths Sister   . Transient ischemic attack Maternal Grandmother   . Osteoarthritis Mother   . Kidney disease Brother     SH:  Married, non smoker  Review of Systems  Constitutional:       Breast lump  All other systems reviewed and are negative.   PHYSICAL EXAMINATION:    BP 128/76 (BP Location: Right Arm, Patient Position: Sitting, Cuff Size: Normal)   Pulse 64   Resp 10   Ht 5\' 6"  (1.676 m)   Wt 160 lb (72.6 kg)   LMP 02/20/1986 (Approximate)   BMI 25.82 kg/m     Physical Exam Constitutional:      Appearance: Normal appearance.  Chest:     Breasts:        Right: Mass and tenderness present. No swelling, bleeding, inverted nipple, nipple discharge or skin change.        Left: Normal. No swelling, bleeding, inverted nipple, mass, nipple discharge, skin change or tenderness.    Musculoskeletal:     Cervical back: No tenderness.  Lymphadenopathy:     Cervical: No cervical adenopathy.     Upper Body:     Right upper body: No supraclavicular or axillary adenopathy.     Left upper body: No supraclavicular or axillary adenopathy.  Skin:    General: Skin is warm.  Neurological:     Mental Status: She is alert.    Chaperone, 04/21/1986, CMA, was present for  exam.  Assessment: Tender breast lesion that feels more like fibrocystic changes  Plan: Because not definitively cystic, will plan diagnostic imaging for pt.  Triage RN will call breast center and schedule and contact pt.

## 2019-08-27 NOTE — Telephone Encounter (Signed)
AEX 06/10/19 MMG 06/24/19- Birads 1, neg   Spoke with pt. Pt reports finding right breast lump at base this morning with doing SBE. Pt states wears wired bras all the time, but might have been tight yesterday. Pt states lump area is sore. Denies breast skin or size changes, nipple discharge or redness, or fever, chills.   Reviewed with Dr Hyacinth Meeker, pt advised to have OV. Pt scheduled as work-in appt at 10 am today 08/27/19. Pt agreeable and verbalized understanding. CPS neg.   Routing to Dr Hyacinth Meeker.  Encounter closed.

## 2019-08-27 NOTE — Telephone Encounter (Signed)
Spoke with Vicki Turner at [facility]. Pt scheduled for right dx MMG and [place] on 7/20 at 850am.  Orders sent for co-sign.   Call placed to pt. Spoke with pt. Pt given scheduled MMG and [place]. Pt agreeable and verbalized understanding of date and time of appt.   Routing to Dr Hyacinth Meeker for review.  Encounter closed.

## 2019-08-27 NOTE — Telephone Encounter (Signed)
Patient is calling in regards to finding lump in right breast. 

## 2019-09-09 ENCOUNTER — Ambulatory Visit
Admission: RE | Admit: 2019-09-09 | Discharge: 2019-09-09 | Disposition: A | Discharge status: Home / Self Care | Payer: Medicare PPO | Source: Ambulatory Visit | Attending: Obstetrics & Gynecology | Admitting: Obstetrics & Gynecology

## 2019-09-09 ENCOUNTER — Other Ambulatory Visit: Payer: Self-pay

## 2019-09-09 DIAGNOSIS — N6310 Unspecified lump in the right breast, unspecified quadrant: Secondary | ICD-10-CM

## 2019-09-09 DIAGNOSIS — R922 Inconclusive mammogram: Secondary | ICD-10-CM | POA: Diagnosis not present

## 2019-10-13 NOTE — Progress Notes (Deleted)
GYNECOLOGY  VISIT  CC:   ***  HPI: 69 y.o. G15P2002 Married White or Caucasian female here for 1 mth breast recheck.  GYNECOLOGIC HISTORY: Patient's last menstrual period was 02/20/1986 (approximate). Contraception: *** Menopausal hormone therapy: ***  Patient Active Problem List   Diagnosis Date Noted  . S/P right TKA 03/11/2019  . Status post surgery 01/01/2017  . S/P laparoscopic appendectomyOct 2017 12/10/2015  . Acute appendicitis 12/10/2015  . Overweight (BMI 25.0-29.9) 12/01/2015  . S/P left TKA 11/30/2015  . Migraine, unspecified, without mention of intractable migraine without mention of status migrainosus 09/02/2013  . History of endometriosis 09/02/2013    Past Medical History:  Diagnosis Date  . Arthritis    left knee  . Family history of adverse reaction to anesthesia    pt daughter very nauseous with anesthesia.  . H/O seasonal allergies    uses OTC med  . Headache    migaines-takes Nadolol for this  . History of anemia    resolved with hysterectomy  . History of endometriosis   . History of migraine   . History of pneumonia 1983  . Osteopenia   . Recurrent sinus infections    last Spring 2017     Past Surgical History:  Procedure Laterality Date  . ANTERIOR AND POSTERIOR REPAIR N/A 01/01/2017   Procedure: ANTERIOR (CYSTOCELE) AND POSTERIOR REPAIR (RECTOCELE);  Surgeon: Patton Salles, MD;  Location: [facility];  Service: Gynecology;  Laterality: N/A;  . APPENDECTOMY    . BLADDER SUSPENSION N/A 01/01/2017   Procedure: TRANSVAGINAL TAPE (TVT) PROCEDURE mid-urethral sling;  Surgeon: Patton Salles, MD;  Location: [facility];  Service: Gynecology;  Laterality: N/A;  . CYSTOSCOPY N/A 01/01/2017   Procedure: CYSTOSCOPY;  Surgeon: Patton Salles, MD;  Location: [facility];  Service: Gynecology;  Laterality: N/A;  . LAPAROSCOPIC APPENDECTOMY N/A 12/10/2015   Procedure: APPENDECTOMY LAPAROSCOPIC;  Surgeon: Luretha Murphy, MD;  Location:  [facility];  Service: General;  Laterality: N/A;  . NASAL SINUS SURGERY  2002   again in 2002, revision  . ROBOTIC ASSISTED BILATERAL SALPINGO OOPHERECTOMY  01/01/2017   Procedure: ROBOTIC ASSISTED BILATERAL SALPINGO OOPHORECTOMY, Extensive Lysis of Adhesions;  Surgeon: Patton Salles, MD;  Location: [facility];  Service: Gynecology;;  . ROBOTIC ASSISTED LAPAROSCOPIC SACROCOLPOPEXY N/A 01/01/2017   Procedure: ROBOTIC ASSISTED LAPAROSCOPIC SACROCOLPOPEXY;  Surgeon: Patton Salles, MD;  Location: [facility];  Service: Gynecology;  Laterality: N/A;  . TOTAL ABDOMINAL HYSTERECTOMY  1998   DUB/endometriosis  . TOTAL KNEE ARTHROPLASTY Left 11/30/2015   Procedure: LEFT TOTAL KNEE ARTHROPLASTY;  Surgeon: Durene Romans, MD;  Location: [facility];  Service: Orthopedics;  Laterality: Left;  Adductor Block  . TOTAL KNEE ARTHROPLASTY Right 03/11/2019   Procedure: RIGHT TOTAL KNEE ARTHROPLASTY;  Surgeon: Durene Romans, MD;  Location: [facility];  Service: Orthopedics;  Laterality: Right;  . WISDOM TOOTH EXTRACTION      MEDS:   Current Outpatient Medications on File Prior to Visit  Medication Sig Dispense Refill  . citalopram (CELEXA) 20 MG tablet Take 1 tablet (20 mg total) by mouth at bedtime. 90 tablet 4  . clobetasol (TEMOVATE) 0.05 % external solution Apply 1 application topically at bedtime as needed (APPLY TO SCALP 1 TO 2 TIMES A DAY FOR FLARES).     . famotidine (PEPCID) 20 MG tablet Take 20 mg by mouth 2 (two) times daily as needed for heartburn or indigestion.    Marland Kitchen  fluticasone (FLONASE) 50 MCG/ACT nasal spray Place 1 spray into both nostrils daily.     Marland Kitchen gabapentin (NEURONTIN) 300 MG capsule Take 1 capsule (300 mg total) by mouth at bedtime. 90 capsule 4  . loratadine (CLARITIN) 10 MG tablet Take 10 mg by mouth daily.    Marland Kitchen losartan (COZAAR) 25 MG tablet     . nadolol (CORGARD) 20 MG tablet TAKE 1 TABLET (20 MG TOTAL) BY MOUTH DAILY. (Patient taking differently: Take 20 mg by mouth at bedtime.  TAKE 1 TABLET (20 MG TOTAL) BY MOUTH DAILY.) 90 tablet 0  . SUMAtriptan (IMITREX) 100 MG tablet Take 1 tablet (100 mg total) by mouth every 2 (two) hours as needed for migraine. 9 tablet 4   No current facility-administered medications on file prior to visit.    ALLERGIES: Novocain [procaine]  Family History  Problem Relation Age of Onset  . Hypertension Maternal Grandfather   . Heart attack Father   . Miscarriages / Stillbirths Sister   . Transient ischemic attack Maternal Grandmother   . Osteoarthritis Mother   . Kidney disease Brother     SH:  ***  Review of Systems  PHYSICAL EXAMINATION:    LMP 02/20/1986 (Approximate)     General appearance: alert, cooperative and appears stated age Neck: no adenopathy, supple, symmetrical, trachea midline and thyroid {CHL AMB PHY EX THYROID NORM [phone]::"normal to inspection and palpation"} CV:  {Exam; heart brief:31539} Lungs:  {pe lungs ob:314451::"clear to auscultation, no wheezes, rales or rhonchi, symmetric air entry"} Breasts: {Exam; breast:13139::"normal appearance, no masses or tenderness"} Abdomen: soft, non-tender; bowel sounds normal; no masses,  no organomegaly Lymph:  no inguinal LAD noted  Pelvic: External genitalia:  no lesions              Urethra:  normal appearing urethra with no masses, tenderness or lesions              Bartholins and Skenes: normal                 Vagina: normal appearing vagina with normal color and discharge, no lesions              Cervix: {CHL AMB PHY EX CERVIX NORM DEFAULT:[phone]::"no lesions"}              Bimanual Exam:  Uterus:  {CHL AMB PHY EX UTERUS NORM [phone]::"normal size, contour, position, consistency, mobility, non-tender"}              Adnexa: {CHL AMB PHY EX ADNEXA NO MASS DEFAULT:[phone]::"no mass, fullness, tenderness"}              Rectovaginal: {yes no:314532}.  Confirms.              Anus:  normal sphincter tone, no lesions  Chaperone,  ***Zenovia Jordan, CMA, was present for exam.  Assessment: ***  Plan: ***   ~{NUMBERS; -10-45 JOINT ROM:10287} minutes spent with patient >50% of time was in face to face discussion of above.

## 2019-10-14 NOTE — Progress Notes (Signed)
GYNECOLOGY  VISIT  CC:   Breast check  HPI: 69 y.o. G58P2002 Married White or Caucasian female here for 1 month breast recheck.  Pt was initially seen 7/87/2021 for breast lump that she noticed that morning.  Last MMG was May but irregular area in breast noted as well so diagnostic obtained.  Pt reports radiologist advised she had fibrous but normal appearing breast tissue in specific area in the RUOQ where the noted the finding.  Denies any new symptoms.  Can still feel area in breast but no change.  Non tender.  Denies nipple discharge.   GYNECOLOGIC HISTORY: Patient's last menstrual period was 02/20/1986 (approximate). Contraception: hysterectomy Menopausal hormone therapy: none  Patient Active Problem List   Diagnosis Date Noted  . S/P right TKA 03/11/2019  . Status post surgery 01/01/2017  . S/P laparoscopic appendectomyOct 2017 12/10/2015  . Acute appendicitis 12/10/2015  . Overweight (BMI 25.0-29.9) 12/01/2015  . S/P left TKA 11/30/2015  . Migraine, unspecified, without mention of intractable migraine without mention of status migrainosus 09/02/2013  . History of endometriosis 09/02/2013    Past Medical History:  Diagnosis Date  . Arthritis    left knee  . Family history of adverse reaction to anesthesia    pt daughter very nauseous with anesthesia.  . H/O seasonal allergies    uses OTC med  . Headache    migaines-takes Nadolol for this  . History of anemia    resolved with hysterectomy  . History of endometriosis   . History of migraine   . History of pneumonia 1983  . Osteopenia   . Recurrent sinus infections    last Spring 2017     Past Surgical History:  Procedure Laterality Date  . ANTERIOR AND POSTERIOR REPAIR N/A 01/01/2017   Procedure: ANTERIOR (CYSTOCELE) AND POSTERIOR REPAIR (RECTOCELE);  Surgeon: Patton Salles, MD;  [facility]: [facility];  Service: Gynecology;  Laterality: N/A;  . APPENDECTOMY    . BLADDER SUSPENSION N/A 01/01/2017    Procedure: TRANSVAGINAL TAPE (TVT) PROCEDURE mid-urethral sling;  Surgeon: Patton Salles, MD;  [facility];  Service: Gynecology;  Laterality: N/A;  . CYSTOSCOPY N/A 01/01/2017   Procedure: CYSTOSCOPY;  Surgeon: Patton Salles, MD;  [facility];  Service: Gynecology;  Laterality: N/A;  . LAPAROSCOPIC APPENDECTOMY N/A 12/10/2015   Procedure: APPENDECTOMY LAPAROSCOPIC;  Surgeon: Luretha Murphy, MD;  [facility];  Service: General;  Laterality: N/A;  . NASAL SINUS SURGERY  2002   again in 2002, revision  . ROBOTIC ASSISTED BILATERAL SALPINGO OOPHERECTOMY  01/01/2017   Procedure: ROBOTIC ASSISTED BILATERAL SALPINGO OOPHORECTOMY, Extensive Lysis of Adhesions;  Surgeon: Patton Salles, MD;  [facility];  Service: Gynecology;;  . ROBOTIC ASSISTED LAPAROSCOPIC SACROCOLPOPEXY N/A 01/01/2017   Procedure: ROBOTIC ASSISTED LAPAROSCOPIC SACROCOLPOPEXY;  Surgeon: Patton Salles, MD;  [facility];  Service: Gynecology;  Laterality: N/A;  . TOTAL ABDOMINAL HYSTERECTOMY  1998   DUB/endometriosis  . TOTAL KNEE ARTHROPLASTY Left 11/30/2015   Procedure: LEFT TOTAL KNEE ARTHROPLASTY;  Surgeon: Durene Romans, MD;  [facility];  Service: Orthopedics;  Laterality: Left;  Adductor Block  . TOTAL KNEE ARTHROPLASTY Right 03/11/2019   Procedure: RIGHT TOTAL KNEE ARTHROPLASTY;  Surgeon: Durene Romans, MD;  [facility];  Service: Orthopedics;  Laterality: Right;  . WISDOM TOOTH EXTRACTION      MEDS:   Current Outpatient Medications on File Prior to Visit  Medication Sig  Dispense Refill  . citalopram (CELEXA) 20 MG tablet Take 1 tablet (20 mg total) by mouth at bedtime. 90 tablet 4  . clobetasol (TEMOVATE) 0.05 % external solution Apply 1 application topically at bedtime as needed (APPLY TO SCALP 1 TO 2 TIMES A DAY FOR FLARES).     . fluticasone (FLONASE) 50 MCG/ACT nasal spray Place 1 spray into both nostrils daily.     Marland Kitchen gabapentin  (NEURONTIN) 300 MG capsule Take 1 capsule (300 mg total) by mouth at bedtime. 90 capsule 4  . loratadine (CLARITIN) 10 MG tablet Take 10 mg by mouth daily.    Marland Kitchen losartan (COZAAR) 25 MG tablet     . nadolol (CORGARD) 20 MG tablet TAKE 1 TABLET (20 MG TOTAL) BY MOUTH DAILY. (Patient taking differently: Take 20 mg by mouth at bedtime. TAKE 1 TABLET (20 MG TOTAL) BY MOUTH DAILY.) 90 tablet 0  . SUMAtriptan (IMITREX) 100 MG tablet Take 1 tablet (100 mg total) by mouth every 2 (two) hours as needed for migraine. 9 tablet 4   No current facility-administered medications on file prior to visit.    ALLERGIES: Novocain [procaine]  Family History  Problem Relation Age of Onset  . Hypertension Maternal Grandfather   . Heart attack Father   . Miscarriages / Stillbirths Sister   . Transient ischemic attack Maternal Grandmother   . Osteoarthritis Mother   . Kidney disease Brother     SH:  Married, non smoker  Review of Systems  Constitutional: Negative.   HENT: Negative.   Eyes: Negative.   Respiratory: Negative.   Cardiovascular: Negative.   Gastrointestinal: Negative.   Endocrine: Negative.   Genitourinary: Negative.   Musculoskeletal: Negative.   Skin: Negative.   Allergic/Immunologic: Negative.   Neurological: Negative.   Hematological: Negative.   Psychiatric/Behavioral: Negative.     PHYSICAL EXAMINATION:    BP 110/72   Pulse 70   Resp 16   Wt 162 lb (73.5 kg)   LMP 02/20/1986 (Approximate)   BMI 26.15 kg/m     Physical Exam Constitutional:      Appearance: Normal appearance.  Chest:     Chest wall: No mass or tenderness.     Breasts:        Right: No inverted nipple, mass, nipple discharge, skin change or tenderness.        Left: Normal.    Musculoskeletal:     Cervical back: Normal range of motion.  Lymphadenopathy:     Cervical: No cervical adenopathy.     Upper Body:     Right upper body: No supraclavicular, axillary or pectoral adenopathy.     Left upper  body: No supraclavicular, axillary or pectoral adenopathy.  Neurological:     Mental Status: She is alert.    Chaperone, Delton Coombes, CMA, was present for exam.  Assessment: Breast lesion that was identified (per pt) as fibroid tissue with diagnostic testing, no change on exam today No family hx of breast cancer  Plan: With exam being stable today, feel ok to monitor breast for changes.  General surgery consultation discussed but pt nor I feel this is needed at this time.  She will continue monthly breast checks and call with any changes/concerns.  She understands keeping her regular appt for MMG which will be 06/2020   22 minutes spent in total with pt

## 2019-10-16 ENCOUNTER — Ambulatory Visit: Payer: Self-pay | Admitting: Obstetrics & Gynecology

## 2019-10-16 ENCOUNTER — Other Ambulatory Visit: Payer: Self-pay

## 2019-10-16 ENCOUNTER — Ambulatory Visit: Payer: Medicare PPO | Admitting: Obstetrics & Gynecology

## 2019-10-16 ENCOUNTER — Encounter: Payer: Self-pay | Admitting: Obstetrics & Gynecology

## 2019-10-16 VITALS — BP 110/72 | HR 70 | Resp 16 | Wt 162.0 lb

## 2019-10-16 DIAGNOSIS — N63 Unspecified lump in unspecified breast: Secondary | ICD-10-CM | POA: Diagnosis not present

## 2019-11-20 DIAGNOSIS — J301 Allergic rhinitis due to pollen: Secondary | ICD-10-CM | POA: Diagnosis not present

## 2019-11-20 DIAGNOSIS — R0683 Snoring: Secondary | ICD-10-CM | POA: Diagnosis not present

## 2019-11-20 DIAGNOSIS — J329 Chronic sinusitis, unspecified: Secondary | ICD-10-CM | POA: Diagnosis not present

## 2019-12-06 ENCOUNTER — Ambulatory Visit: Payer: Medicare PPO

## 2019-12-12 DIAGNOSIS — D2271 Melanocytic nevi of right lower limb, including hip: Secondary | ICD-10-CM | POA: Diagnosis not present

## 2019-12-12 DIAGNOSIS — L723 Sebaceous cyst: Secondary | ICD-10-CM | POA: Diagnosis not present

## 2019-12-12 DIAGNOSIS — L578 Other skin changes due to chronic exposure to nonionizing radiation: Secondary | ICD-10-CM | POA: Diagnosis not present

## 2019-12-12 DIAGNOSIS — L821 Other seborrheic keratosis: Secondary | ICD-10-CM | POA: Diagnosis not present

## 2019-12-12 DIAGNOSIS — H61002 Unspecified perichondritis of left external ear: Secondary | ICD-10-CM | POA: Diagnosis not present

## 2019-12-12 DIAGNOSIS — Q825 Congenital non-neoplastic nevus: Secondary | ICD-10-CM | POA: Diagnosis not present

## 2019-12-12 DIAGNOSIS — Z23 Encounter for immunization: Secondary | ICD-10-CM | POA: Diagnosis not present

## 2019-12-12 DIAGNOSIS — L219 Seborrheic dermatitis, unspecified: Secondary | ICD-10-CM | POA: Diagnosis not present

## 2019-12-12 DIAGNOSIS — D225 Melanocytic nevi of trunk: Secondary | ICD-10-CM | POA: Diagnosis not present

## 2020-01-27 DIAGNOSIS — J329 Chronic sinusitis, unspecified: Secondary | ICD-10-CM | POA: Diagnosis not present

## 2020-01-27 DIAGNOSIS — J301 Allergic rhinitis due to pollen: Secondary | ICD-10-CM | POA: Diagnosis not present

## 2020-01-27 DIAGNOSIS — R0683 Snoring: Secondary | ICD-10-CM | POA: Diagnosis not present

## 2020-01-28 ENCOUNTER — Telehealth: Payer: Self-pay

## 2020-01-28 DIAGNOSIS — N951 Menopausal and female climacteric states: Secondary | ICD-10-CM | POA: Diagnosis not present

## 2020-01-28 DIAGNOSIS — Z0000 Encounter for general adult medical examination without abnormal findings: Secondary | ICD-10-CM | POA: Diagnosis not present

## 2020-01-28 DIAGNOSIS — Z8669 Personal history of other diseases of the nervous system and sense organs: Secondary | ICD-10-CM | POA: Diagnosis not present

## 2020-01-28 DIAGNOSIS — I10 Essential (primary) hypertension: Secondary | ICD-10-CM | POA: Diagnosis not present

## 2020-01-28 DIAGNOSIS — E871 Hypo-osmolality and hyponatremia: Secondary | ICD-10-CM | POA: Diagnosis not present

## 2020-01-28 DIAGNOSIS — E7889 Other lipoprotein metabolism disorders: Secondary | ICD-10-CM | POA: Diagnosis not present

## 2020-01-28 DIAGNOSIS — M85852 Other specified disorders of bone density and structure, left thigh: Secondary | ICD-10-CM | POA: Diagnosis not present

## 2020-01-28 NOTE — Telephone Encounter (Signed)
Patient saw her primary doctor Dr. Clarene Duke today and he wants to wean her off of Gabapentin. Patient states she needs to terminate the medical contract so he can take over prescribing this medication.

## 2020-01-29 NOTE — Telephone Encounter (Signed)
Left message to call Noreene Larsson, RN at [facility] [phone].    Reviewed with Dr. Hyacinth Meeker by [facility] staff message.  She's had a hysterectomy and we talked the last time about whether she needs gyn follow-up. She had pelvic floor repair with Dr. Edward Jolly and me and has done well.  This is not a narcotic and there is not contract. It is fine for her to let her PCP manage this going forward. I think it's ok to let her know there is no formal contract for management of this medication and it's fine to do this if her PCP feels comfortable.

## 2020-01-29 NOTE — Telephone Encounter (Signed)
Spoke with patient. Advised per Dr. Hyacinth Meeker. Patient does not plan to transfer care at this time, but she does plan to have PCP manage her medications. States she will see Dr. Edward Jolly in the future if needed. Patient thankful for return call.   Routing to Dr. Edward Jolly for final review. Patient is agreeable to disposition. Will close encounter.

## 2020-01-29 NOTE — Telephone Encounter (Signed)
Patient is returning call.  °

## 2020-02-09 DIAGNOSIS — H9202 Otalgia, left ear: Secondary | ICD-10-CM | POA: Diagnosis not present

## 2020-02-09 DIAGNOSIS — H61002 Unspecified perichondritis of left external ear: Secondary | ICD-10-CM | POA: Diagnosis not present

## 2020-02-18 DIAGNOSIS — R059 Cough, unspecified: Secondary | ICD-10-CM | POA: Diagnosis not present

## 2020-02-18 DIAGNOSIS — U071 COVID-19: Secondary | ICD-10-CM | POA: Diagnosis not present

## 2020-02-18 DIAGNOSIS — R509 Fever, unspecified: Secondary | ICD-10-CM | POA: Diagnosis not present

## 2020-02-18 DIAGNOSIS — B349 Viral infection, unspecified: Secondary | ICD-10-CM | POA: Diagnosis not present

## 2020-03-24 DIAGNOSIS — R300 Dysuria: Secondary | ICD-10-CM | POA: Diagnosis not present

## 2020-04-20 DIAGNOSIS — H5213 Myopia, bilateral: Secondary | ICD-10-CM | POA: Diagnosis not present

## 2020-04-20 DIAGNOSIS — H524 Presbyopia: Secondary | ICD-10-CM | POA: Diagnosis not present

## 2020-04-20 DIAGNOSIS — H10413 Chronic giant papillary conjunctivitis, bilateral: Secondary | ICD-10-CM | POA: Diagnosis not present

## 2020-04-20 DIAGNOSIS — H04123 Dry eye syndrome of bilateral lacrimal glands: Secondary | ICD-10-CM | POA: Diagnosis not present

## 2020-07-26 ENCOUNTER — Ambulatory Visit: Payer: Medicare PPO

## 2020-08-02 ENCOUNTER — Other Ambulatory Visit: Payer: Self-pay | Admitting: Family Medicine

## 2020-08-02 ENCOUNTER — Other Ambulatory Visit: Payer: Self-pay

## 2020-08-02 ENCOUNTER — Ambulatory Visit
Admission: RE | Admit: 2020-08-02 | Discharge: 2020-08-02 | Disposition: A | Discharge status: Home / Self Care | Payer: Medicare PPO | Source: Ambulatory Visit | Attending: Family Medicine | Admitting: Family Medicine

## 2020-08-02 DIAGNOSIS — Z1231 Encounter for screening mammogram for malignant neoplasm of breast: Secondary | ICD-10-CM

## 2020-10-11 DIAGNOSIS — H6990 Unspecified Eustachian tube disorder, unspecified ear: Secondary | ICD-10-CM | POA: Diagnosis not present

## 2020-10-11 DIAGNOSIS — Z6825 Body mass index (BMI) 25.0-25.9, adult: Secondary | ICD-10-CM | POA: Diagnosis not present

## 2020-12-16 DIAGNOSIS — H61002 Unspecified perichondritis of left external ear: Secondary | ICD-10-CM | POA: Diagnosis not present

## 2020-12-16 DIAGNOSIS — B078 Other viral warts: Secondary | ICD-10-CM | POA: Diagnosis not present

## 2020-12-16 DIAGNOSIS — Q825 Congenital non-neoplastic nevus: Secondary | ICD-10-CM | POA: Diagnosis not present

## 2020-12-16 DIAGNOSIS — L719 Rosacea, unspecified: Secondary | ICD-10-CM | POA: Diagnosis not present

## 2020-12-16 DIAGNOSIS — D225 Melanocytic nevi of trunk: Secondary | ICD-10-CM | POA: Diagnosis not present

## 2020-12-16 DIAGNOSIS — L578 Other skin changes due to chronic exposure to nonionizing radiation: Secondary | ICD-10-CM | POA: Diagnosis not present

## 2020-12-16 DIAGNOSIS — D2271 Melanocytic nevi of right lower limb, including hip: Secondary | ICD-10-CM | POA: Diagnosis not present

## 2021-02-24 DIAGNOSIS — N951 Menopausal and female climacteric states: Secondary | ICD-10-CM | POA: Diagnosis not present

## 2021-02-24 DIAGNOSIS — I10 Essential (primary) hypertension: Secondary | ICD-10-CM | POA: Diagnosis not present

## 2021-02-24 DIAGNOSIS — Z136 Encounter for screening for cardiovascular disorders: Secondary | ICD-10-CM | POA: Diagnosis not present

## 2021-02-24 DIAGNOSIS — G43009 Migraine without aura, not intractable, without status migrainosus: Secondary | ICD-10-CM | POA: Diagnosis not present

## 2021-02-24 DIAGNOSIS — J302 Other seasonal allergic rhinitis: Secondary | ICD-10-CM | POA: Diagnosis not present

## 2021-02-24 DIAGNOSIS — Z1231 Encounter for screening mammogram for malignant neoplasm of breast: Secondary | ICD-10-CM | POA: Diagnosis not present

## 2021-02-24 DIAGNOSIS — Z0000 Encounter for general adult medical examination without abnormal findings: Secondary | ICD-10-CM | POA: Diagnosis not present

## 2021-02-24 DIAGNOSIS — Z13220 Encounter for screening for lipoid disorders: Secondary | ICD-10-CM | POA: Diagnosis not present

## 2021-02-24 DIAGNOSIS — M85852 Other specified disorders of bone density and structure, left thigh: Secondary | ICD-10-CM | POA: Diagnosis not present

## 2021-03-11 ENCOUNTER — Other Ambulatory Visit: Payer: Self-pay | Admitting: Family Medicine

## 2021-03-11 DIAGNOSIS — Z1231 Encounter for screening mammogram for malignant neoplasm of breast: Secondary | ICD-10-CM

## 2021-03-11 DIAGNOSIS — M85852 Other specified disorders of bone density and structure, left thigh: Secondary | ICD-10-CM

## 2021-05-03 DIAGNOSIS — H10413 Chronic giant papillary conjunctivitis, bilateral: Secondary | ICD-10-CM | POA: Diagnosis not present

## 2021-05-03 DIAGNOSIS — H04123 Dry eye syndrome of bilateral lacrimal glands: Secondary | ICD-10-CM | POA: Diagnosis not present

## 2021-05-03 DIAGNOSIS — H5213 Myopia, bilateral: Secondary | ICD-10-CM | POA: Diagnosis not present

## 2021-05-04 DIAGNOSIS — J029 Acute pharyngitis, unspecified: Secondary | ICD-10-CM | POA: Diagnosis not present

## 2021-06-28 DIAGNOSIS — R202 Paresthesia of skin: Secondary | ICD-10-CM | POA: Diagnosis not present

## 2021-06-28 DIAGNOSIS — J320 Chronic maxillary sinusitis: Secondary | ICD-10-CM | POA: Diagnosis not present

## 2021-06-28 DIAGNOSIS — R55 Syncope and collapse: Secondary | ICD-10-CM | POA: Diagnosis not present

## 2021-06-28 DIAGNOSIS — R001 Bradycardia, unspecified: Secondary | ICD-10-CM | POA: Diagnosis not present

## 2021-06-28 DIAGNOSIS — I10 Essential (primary) hypertension: Secondary | ICD-10-CM | POA: Diagnosis not present

## 2021-06-29 ENCOUNTER — Telehealth: Payer: Self-pay

## 2021-06-29 NOTE — Telephone Encounter (Signed)
NOTES SCANNED TO REFERRAL 

## 2021-06-30 NOTE — Progress Notes (Signed)
?Cardiology Office Note:   ? ?Date:  07/01/2021  ? ?ID:  Vicki Turner, DOB 1950/11/30, MRN [id number] ? ?PCP:  Vicki Gosselin, MD  ? ?[facility] ?Cardiologist:  [name], MD ?Referring MD: Vicki Gosselin, MD  ? ?Chief Complaint/Reason for Referral: Syncope ? ?ASSESSMENT:   ? ?1. Dizziness   ?2. Hypertension, unspecified type   ?3. Aortic atherosclerosis (HCC)   ? ? ?PLAN:   ? ?In order of problems listed above: ?1.  Dizziness: We will obtain monitor and echocardiogram to evaluate further.  History of sinus issues this may represent an ENT issue.  There does not seem to be a prodromal collection of symptoms associated with what happened.  Reassuringly she did not pass out but felt exceedingly dizzy.  She had no palpitations, nausea, or pain to suggest a vasovagal episode.  We will keep follow-up open-ended depending on results of these tests. ?2.  Hypertension: Patient's blood pressure is mildly above goal today.  She had her Cozaar increased to 50 mg by her PCP recently. ?3.  Aortic atherosclerosis: This was seen on CT scan in 2017.  We will start aspirin 81 mg and I recommend statin therapy for goal LDL of less than 70.  She will follow-up with her PCP about this ? ? ?     ? ?   ? ?Dispo:  Return if symptoms worsen or fail to improve.  ? ?  ? ?Medication Adjustments/Labs and Tests Ordered: ?Current medicines are reviewed at length with the patient today.  Concerns regarding medicines are outlined above. ? ?The following changes have been made:    ? ?Labs/tests ordered: ?Orders Placed This Encounter  ?Procedures  ? LONG TERM MONITOR (3-14 DAYS)  ? EKG 12-Lead  ? ECHOCARDIOGRAM COMPLETE  ? ? ?Medication Changes: ?Meds ordered this encounter  ?Medications  ? aspirin EC 81 MG tablet  ?  Sig: Take 1 tablet (81 mg total) by mouth daily. Swallow whole.  ?  Dispense:  90 tablet  ?  Refill:  3  ? ? ? ?Current medicines are reviewed at length with the patient today.  The patient does not have  concerns regarding medicines. ? ? ?History of Present Illness:   ? ?FOCUSED PROBLEM LIST:   ?1.  Hypertension ?2.  Aortic atherosclerosis on CT 2017 ? ?The patient is a 71 y.o. female with the indicated medical history here for recommendations regarding syncope.  Apparently the patient was in her normal state of health over the weekend.  She was sitting watching TV and drinking a glass of wine.  She was sitting in a chair and apparently did not feel well.  She felt dizzy but did not pass out.  She does not remember the event she.  She is referred for further recommendations.  When she was seen by her primary care provider recently she was noted to have heart rate in the 50s and her nadolol was discontinued.   ? ?She tells me that 20 years ago she had a few episodes associated with dental pain.  Once the dental procedure was performed she no longer had pain she no longer had these episodes of lightheadedness and syncope.  She had a cardiac evaluation around this time which was negative.  She tells me that about a year and a half ago she had another episode when she took too much allergy medication. ? ?The episode last weekend consisted of her being at her daughter's house and she was having a glass of  wine.  She denies any palpitations, chest pain, shortness of breath prior to the episode.  She could not definitively tell me whether it was associated with a certain position of her head or not.  She drank her wine and then felt exceedingly lightheaded but did not pass out.  She then walked upstairs and lay down and was asleep for about 12 hours.  Since that time she has had no recurrence of the symptoms.  She exercises regularly and denies any exertional angina.  She has had no severe bleeding or bruising episodes, paroxysmal nocturnal dyspnea, orthopnea, or peripheral edema. ? ?Orthostatic blood pressures were checked today which were negative. ? ? ?Current Medications: ?Current Meds  ?Medication Sig  ? aspirin EC 81  MG tablet Take 1 tablet (81 mg total) by mouth daily. Swallow whole.  ? citalopram (CELEXA) 20 MG tablet Take 1 tablet (20 mg total) by mouth at bedtime.  ? clobetasol (TEMOVATE) 0.05 % external solution Apply 1 application topically at bedtime as needed (APPLY TO SCALP 1 TO 2 TIMES A DAY FOR FLARES).   ? fluticasone (FLONASE) 50 MCG/ACT nasal spray Place 1 spray into both nostrils daily.  ? gabapentin (NEURONTIN) 100 MG capsule Take 200 mg by mouth at bedtime.  ? gabapentin (NEURONTIN) 300 MG capsule Take 1 capsule (300 mg total) by mouth at bedtime.  ? loratadine (CLARITIN) 10 MG tablet Take 10 mg by mouth daily.  ? losartan (COZAAR) 25 MG tablet   ? losartan (COZAAR) 50 MG tablet Take 50 mg by mouth daily.  ? SUMAtriptan (IMITREX) 100 MG tablet Take 1 tablet (100 mg total) by mouth every 2 (two) hours as needed for migraine.  ? [DISCONTINUED] nadolol (CORGARD) 20 MG tablet TAKE 1 TABLET (20 MG TOTAL) BY MOUTH DAILY. (Patient taking differently: Take 20 mg by mouth at bedtime. TAKE 1 TABLET (20 MG TOTAL) BY MOUTH DAILY.)  ?  ? ?Allergies:    ?Novocain [procaine]  ? ?Social History:   ?Social History  ? ?Tobacco Use  ? Smoking status: Never  ? Smokeless tobacco: Never  ?Vaping Use  ? Vaping Use: Never used  ?Substance Use Topics  ? Alcohol use: Yes  ?  Alcohol/week: 5.0 standard drinks  ?  Types: 5 Glasses of wine per week  ? Drug use: No  ?  ? ?Family Hx: ?Family History  ?Problem Relation Age of Onset  ? Hypertension Maternal Grandfather   ? Heart attack Father   ? Miscarriages / Stillbirths Sister   ? Transient ischemic attack Maternal Grandmother   ? Osteoarthritis Mother   ? Kidney disease Brother   ?  ? ?Review of Systems:   ?Please see the history of present illness.    ?All other systems reviewed and are negative. ?  ? ? ?EKGs/Labs/Other Test Reviewed:   ? ?EKG:  EKG performed 2018 that I personally reviewed demonstrates sinus rhythm; EKG performed today that I personally reviewed demonstrates sinus rhythm  with nonspecific T wave abnormality. ? ?Prior CV studies: ?None available ? ?Other studies Reviewed: ?Review of the additional studies/records demonstrates: CT abdomen pelvis 2017 with aortic atherosclerosis ? ?Recent Labs: ?No results found for requested labs within last 8760 hours.  ? ?Recent Lipid Panel ?No results found for: CHOL, TRIG, HDL, LDLCALC, LDLDIRECT ? ?Risk Assessment/Calculations:   ? ? ?    ? ?Physical Exam:   ? ?VS:  BP 138/80   Pulse 62   Ht 5' 5.5" (1.664 m)   Wt 162 lb (  73.5 kg)   LMP 02/20/1986 (Approximate)   SpO2 97%   BMI 26.55 kg/m?    ?Wt Readings from Last 3 Encounters:  ?07/01/21 162 lb (73.5 kg)  ?10/16/19 162 lb (73.5 kg)  ?08/27/19 160 lb (72.6 kg)  ?  ?GENERAL:  No apparent distress, AOx3 ?HEENT:  No carotid bruits, +2 carotid impulses, no scleral icterus ?CAR: RRR no murmurs, gallops, rubs, or thrills ?RES:  Clear to auscultation bilaterally ?ABD:  Soft, nontender, nondistended, positive bowel sounds x 4 ?VASC:  +2 radial pulses, +2 carotid pulses, palpable pedal pulses ?NEURO:  CN 2-12 grossly intact; motor and sensory grossly intact ?PSYCH:  No active depression or anxiety ?EXT:  No edema, ecchymosis, or cyanosis ? ?Signed, ?Orbie Pyo, MD  ?07/01/2021 1:20 PM    ?[facility] ?[address] [place], [place], [place]  [address] ?Phone: [phone]; Fax: [phone]  ? ?Note:  This document was prepared using Dragon voice recognition software and may include unintentional dictation errors. ?

## 2021-07-01 ENCOUNTER — Ambulatory Visit: Payer: Medicare PPO | Admitting: Internal Medicine

## 2021-07-01 ENCOUNTER — Ambulatory Visit (INDEPENDENT_AMBULATORY_CARE_PROVIDER_SITE_OTHER): Payer: Medicare PPO

## 2021-07-01 ENCOUNTER — Encounter: Payer: Self-pay | Admitting: Internal Medicine

## 2021-07-01 VITALS — BP 138/80 | HR 62 | Ht 65.5 in | Wt 162.0 lb

## 2021-07-01 DIAGNOSIS — I10 Essential (primary) hypertension: Secondary | ICD-10-CM | POA: Diagnosis not present

## 2021-07-01 DIAGNOSIS — I700 Atherosclerosis of aorta: Secondary | ICD-10-CM

## 2021-07-01 DIAGNOSIS — R55 Syncope and collapse: Secondary | ICD-10-CM

## 2021-07-01 DIAGNOSIS — R42 Dizziness and giddiness: Secondary | ICD-10-CM

## 2021-07-01 MED ORDER — ASPIRIN EC 81 MG PO TBEC: 81.0000 mg | DELAYED_RELEASE_TABLET | Freq: Every day | ORAL | 3 refills | Status: AC

## 2021-07-01 NOTE — Progress Notes (Unsigned)
Applied a 7 day Zio XT monitor to patient in the office 

## 2021-07-01 NOTE — Patient Instructions (Addendum)
Medication Instructions:  ?Your physician has recommended you make the following change in your medication:  ? ?1.) start aspirin 81 mg -one tablet daily ? ? ?Lab Work: ?none ? ? ?Testing/Procedures: ?Your physician has requested that you have an echocardiogram. Echocardiography is a painless test that uses sound waves to create images of your heart. It provides your doctor with information about the size and shape of your heart and how well your heart?s chambers and valves are working. This procedure takes approximately one hour. There are no restrictions for this procedure. ? ?7 Day Heart Monitor (ZIO) ? ? ?Follow-Up: ?As needed based on results ? ?Other Instructions ? ? ?Important Information About Sugar ? ? ? ? ?  ?

## 2021-07-13 DIAGNOSIS — R42 Dizziness and giddiness: Secondary | ICD-10-CM | POA: Diagnosis not present

## 2021-07-14 ENCOUNTER — Telehealth: Payer: Self-pay | Admitting: *Deleted

## 2021-07-14 DIAGNOSIS — I472 Ventricular tachycardia, unspecified: Secondary | ICD-10-CM

## 2021-07-14 DIAGNOSIS — R42 Dizziness and giddiness: Secondary | ICD-10-CM

## 2021-07-14 MED ORDER — METOPROLOL SUCCINATE ER 25 MG PO TB24: 12.5000 mg | ORAL_TABLET | Freq: Every day | ORAL | 3 refills | Status: DC

## 2021-07-14 NOTE — Telephone Encounter (Signed)
Called and spoke w patient. Reviewed results.  She did not experience dizziness during the 7 days she wore the monitor.  She takes plain Clariton daily and uses Flonase nightly and drinks one glass of wine nightly.    Order placed for cardiac MR and referral placed to EP for after the MRI.  Echo cancelled.

## 2021-07-14 NOTE — Telephone Encounter (Signed)
-----   Message from Orbie Pyo, MD sent at 07/13/2021 11:56 PM EDT ----- She had a couple of episodes of ventricular tachycardia which did not last long.  Given her dizziness, I would like to cancel her echo and get a cardiac MR instead.  I would like her to see EP (re: VT/dizziness) following the MR study.  I would like her to start Toprol XL 12.5mg  at bedtime.

## 2021-07-22 ENCOUNTER — Other Ambulatory Visit (HOSPITAL_COMMUNITY): Payer: Medicare PPO

## 2021-07-25 DIAGNOSIS — Z9889 Other specified postprocedural states: Secondary | ICD-10-CM | POA: Diagnosis not present

## 2021-07-25 DIAGNOSIS — J301 Allergic rhinitis due to pollen: Secondary | ICD-10-CM | POA: Diagnosis not present

## 2021-07-29 ENCOUNTER — Other Ambulatory Visit (HOSPITAL_COMMUNITY): Payer: Medicare PPO

## 2021-08-04 ENCOUNTER — Ambulatory Visit
Admission: RE | Admit: 2021-08-04 | Discharge: 2021-08-04 | Disposition: A | Discharge status: Home / Self Care | Payer: Medicare PPO | Source: Ambulatory Visit | Attending: Family Medicine | Admitting: Family Medicine

## 2021-08-04 ENCOUNTER — Other Ambulatory Visit: Payer: Medicare PPO

## 2021-08-04 ENCOUNTER — Ambulatory Visit: Payer: Medicare PPO

## 2021-08-04 DIAGNOSIS — M85852 Other specified disorders of bone density and structure, left thigh: Secondary | ICD-10-CM

## 2021-08-04 DIAGNOSIS — Z1231 Encounter for screening mammogram for malignant neoplasm of breast: Secondary | ICD-10-CM

## 2021-08-30 ENCOUNTER — Encounter: Payer: Self-pay | Admitting: Internal Medicine

## 2021-08-30 ENCOUNTER — Ambulatory Visit: Payer: Medicare PPO | Admitting: Internal Medicine

## 2021-08-30 DIAGNOSIS — R42 Dizziness and giddiness: Secondary | ICD-10-CM | POA: Diagnosis not present

## 2021-08-30 MED ORDER — METOPROLOL SUCCINATE ER 25 MG PO TB24: 25.0000 mg | ORAL_TABLET | Freq: Every day | ORAL | 3 refills | Status: DC

## 2021-08-30 NOTE — Progress Notes (Signed)
HPI Mrs. Vicki Turner is referred by Dr. Lynnette Caffey for evaluation of PVC's and PAC's. She is an otherwise healthy 71 yo woman who looks younger than her stated age, s/p bilateral knee replacements. She describes a spell when she was visiting her daughter where she was watching TV and felt bad and slumped over. She denies ever losing consciousness and her symptoms were severe for a few minutes but lasted hours. She made it upstairs and slept until the morning when she awoke feeling fine. She denies palpitations during the episode. Subsequent eval with a Zio monitor demonstrates NSR with NS SVT and NS VT, lasting only a few seconds and not particularly fast. No prolonged pauses or atrial fib. She was started on low dose toprol.  Allergies  Allergen Reactions   Novocain [Procaine] Hives and Other (See Comments)    Jittery feeling.  States she can take Lidocaine.     Current Outpatient Medications  Medication Sig Dispense Refill   aspirin EC 81 MG tablet Take 1 tablet (81 mg total) by mouth daily. Swallow whole. 90 tablet 3   CALCIUM-VITAMIN D PO Take by mouth.     citalopram (CELEXA) 20 MG tablet Take 1 tablet (20 mg total) by mouth at bedtime. 90 tablet 4   clobetasol (TEMOVATE) 0.05 % external solution Apply 1 application topically at bedtime as needed (APPLY TO SCALP 1 TO 2 TIMES A DAY FOR FLARES).      fluticasone (FLONASE) 50 MCG/ACT nasal spray Place 1 spray into both nostrils daily.     gabapentin (NEURONTIN) 100 MG capsule Take 200 mg by mouth at bedtime.     loratadine (CLARITIN) 10 MG tablet Take 10 mg by mouth daily.     losartan (COZAAR) 50 MG tablet Take 50 mg by mouth daily.     metoprolol succinate (TOPROL XL) 25 MG 24 hr tablet Take 1 tablet (25 mg total) by mouth daily. 90 tablet 3   rosuvastatin (CRESTOR) 10 MG tablet Take 10 mg by mouth once a week.     SUMAtriptan (IMITREX) 100 MG tablet Take 1 tablet (100 mg total) by mouth every 2 (two) hours as needed for migraine. 9  tablet 4   TURMERIC PO Take by mouth.     Vitamin D, Ergocalciferol, (DRISDOL) 1.25 MG (50000 UNIT) CAPS capsule Take by mouth.     No current facility-administered medications for this visit.     Past Medical History:  Diagnosis Date   Arthritis    left knee   Bradycardia    COVID    Decreased hearing of left ear    Endometriosis    Family history of adverse reaction to anesthesia    pt daughter very nauseous with anesthesia.   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 headache    Osteopenia    Otitis externa    Paresthesia    Recurrent sinus infections    last Spring 2017    Seasonal allergies    Syncope and collapse     ROS:   All systems reviewed and negative except as noted in the HPI.  Past Surgical History:  Procedure Laterality Date   ANTERIOR AND POSTERIOR REPAIR N/A 01/01/2017   Procedure: ANTERIOR (CYSTOCELE) AND POSTERIOR REPAIR (RECTOCELE);  Surgeon: Patton Salles, MD;  Location: [facility];  Service: Gynecology;  Laterality: N/A;   APPENDECTOMY     BLADDER SUSPENSION N/A 01/01/2017   Procedure: TRANSVAGINAL TAPE (TVT) PROCEDURE mid-urethral sling;  Surgeon: Patton Salles, MD;  Location: [facility];  Service: Gynecology;  Laterality: N/A;   CYSTOSCOPY N/A 01/01/2017   Procedure: CYSTOSCOPY;  Surgeon: Patton Salles, MD;  Location: [facility];  Service: Gynecology;  Laterality: N/A;   LAPAROSCOPIC APPENDECTOMY N/A 12/10/2015   Procedure: APPENDECTOMY LAPAROSCOPIC;  Surgeon: Luretha Murphy, MD;  Location: [facility];  Service: General;  Laterality: N/A;   NASAL SINUS SURGERY  2002   again in 2002, revision   ROBOTIC ASSISTED BILATERAL SALPINGO OOPHERECTOMY   01/01/2017   Procedure: ROBOTIC ASSISTED BILATERAL SALPINGO OOPHORECTOMY, Extensive Lysis of Adhesions;  Surgeon: Patton Salles, MD;  Location: [facility];  Service: Gynecology;;   ROBOTIC ASSISTED LAPAROSCOPIC SACROCOLPOPEXY N/A 01/01/2017   Procedure: ROBOTIC ASSISTED LAPAROSCOPIC SACROCOLPOPEXY;  Surgeon: Patton Salles, MD;  Location: [facility];  Service: Gynecology;  Laterality: N/A;   TOTAL ABDOMINAL HYSTERECTOMY  1998   DUB/endometriosis   TOTAL KNEE ARTHROPLASTY Left 11/30/2015   Procedure: LEFT TOTAL KNEE ARTHROPLASTY;  Surgeon: Durene Romans, MD;  Location: [facility];  Service: Orthopedics;  Laterality: Left;  Adductor Block   TOTAL KNEE ARTHROPLASTY Right 03/11/2019   Procedure: RIGHT TOTAL KNEE ARTHROPLASTY;  Surgeon: Durene Romans, MD;  Location: [facility];  Service: Orthopedics;  Laterality: Right;   WISDOM TOOTH EXTRACTION       Family History  Problem Relation Age of Onset   Osteoarthritis Mother    Heart attack Father    Miscarriages / [place] Sister    Transient ischemic attack Maternal Grandmother    Hypertension Maternal Grandfather    Kidney disease Brother    Breast cancer Neg Hx      Social History   Socioeconomic History   Marital status: Married    Spouse name: Not on file   Number of children: Not on file   Years of education: Not on file   Highest education level: Not on file  Occupational History   Not on file  Tobacco Use   Smoking status: Never   Smokeless tobacco: Never  Vaping Use   Vaping Use: Never used  Substance and Sexual Activity   Alcohol use: Yes    Alcohol/week: 5.0 standard drinks of alcohol    Types: 5 Glasses of wine per week   Drug use: No   Sexual activity: Not Currently    Partners: Male    Birth control/protection: Surgical    Comment: TAH  Other Topics Concern   Not on file  Social History Narrative   Not on file   Social Determinants of Health   Financial Resource Strain: Not on file  Food  Insecurity: Not on file  Transportation Needs: Not on file  Physical Activity: Not on file  Stress: Not on file  Social Connections: Not on file  Intimate Partner Violence: Not on file     BP 130/78   Pulse 70   Ht 5' 5.5" (1.664 m)   Wt 163 lb 9.6 oz (74.2 kg)   LMP 02/20/1986 (Approximate)   SpO2 99%   BMI 26.81 kg/m   Physical Exam:  Well appearing  71 yo woman, NAD HEENT: Unremarkable Neck:  No JVD, no thyromegally Lymphatics:  No adenopathy Back:  No CVA tenderness Lungs:  Clear with no wheezes HEART:  Regular rate rhythm, no murmurs, no rubs, no clicks Abd:  soft, positive bowel sounds, no organomegally, no rebound, no guarding Ext:  2 plus pulses, no edema, no cyanosis, no clubbing Skin:  No rashes no nodules Neuro:  CN II through XII intact, motor grossly intact  EKG - reviewed. NSR with non-specific T wave abnormality   Assess/Plan:  NS SVT and NS VT - she is minimally if at all symptomatic. I have asked her to increase her toprol to 25 mg daily.  Episode of altered consciousness - the etiology is unlikely arrhythmic in nature. Await CMRI. If she has evidence of structural heart disease, would treat based on the findings. As she did not pass out I would not restrict driving.   Sharlot Gowda Tane Biegler,MD

## 2021-08-30 NOTE — Patient Instructions (Addendum)
Medication Instructions:  Your physician has recommended you make the following change in your medication:    INCREASE your Toprol XL (metoprolol succinate) 25 mg-  Take one tablet by mouth daily.  Labwork: None ordered.  Testing/Procedures: None ordered.  Follow-Up: Your physician wants you to follow-up based on results of MRI.   Any Other Special Instructions Will Be Listed Below (If Applicable).  If you need a refill on your cardiac medications before your next appointment, please call your pharmacy.   Important Information About Sugar

## 2021-09-16 ENCOUNTER — Telehealth: Payer: Self-pay

## 2021-09-22 ENCOUNTER — Telehealth (HOSPITAL_COMMUNITY): Payer: Self-pay | Admitting: *Deleted

## 2021-09-22 ENCOUNTER — Other Ambulatory Visit (HOSPITAL_COMMUNITY): Payer: Self-pay | Admitting: *Deleted

## 2021-09-22 DIAGNOSIS — I472 Ventricular tachycardia, unspecified: Secondary | ICD-10-CM

## 2021-09-22 DIAGNOSIS — R42 Dizziness and giddiness: Secondary | ICD-10-CM

## 2021-09-22 NOTE — Telephone Encounter (Signed)
Patient returning call regarding upcoming cardiac imaging study; pt verbalizes understanding of appt date/time, parking situation and where to check in, and verified current allergies; name and call back number provided for further questions should they arise  Larey Brick RN Navigator Cardiac Imaging [facility] [phone] office [phone] cell   She is aware to obtain labs prior to scan and has had MRIs in the past without issue.

## 2021-09-22 NOTE — Telephone Encounter (Signed)
Attempted to call patient regarding upcoming cardiac MRI appointment and to see if patient can obtain labs prior to appointment. Left message on voicemail with name and callback number  Larey Brick RN Navigator Cardiac Imaging [facility] [phone] Office [phone] Cell

## 2021-09-23 ENCOUNTER — Other Ambulatory Visit: Payer: Medicare PPO

## 2021-09-23 LAB — HEMOGLOBIN AND HEMATOCRIT, BLOOD
Hematocrit: 38.6 % (ref 34.0–46.6)
Hemoglobin: 12.9 g/dL (ref 11.1–15.9)

## 2021-09-26 ENCOUNTER — Ambulatory Visit (HOSPITAL_COMMUNITY)
Admission: RE | Admit: 2021-09-26 | Discharge: 2021-09-26 | Disposition: A | Discharge status: Home / Self Care | Payer: Medicare PPO | Source: Ambulatory Visit | Attending: Internal Medicine | Admitting: Internal Medicine

## 2021-09-26 DIAGNOSIS — R42 Dizziness and giddiness: Secondary | ICD-10-CM | POA: Diagnosis present

## 2021-09-26 DIAGNOSIS — I472 Ventricular tachycardia, unspecified: Secondary | ICD-10-CM | POA: Diagnosis present

## 2021-09-26 MED ORDER — GADOBUTROL 1 MMOL/ML IV SOLN
11.0000 mL | Freq: Once | INTRAVENOUS | Status: AC | PRN
  Administered 2021-09-26: 11 mL via INTRAVENOUS

## 2021-10-04 ENCOUNTER — Telehealth: Payer: Self-pay

## 2021-10-04 NOTE — Telephone Encounter (Signed)
Outreach made to Pt.  Advised of MRI results.  Advised per Dr. Ladona Ridgel Pt will follow up PRN.  Advised to call if worsening symptoms.  All questions answered.

## 2021-10-04 NOTE — Telephone Encounter (Signed)
Error

## 2021-11-01 DIAGNOSIS — E785 Hyperlipidemia, unspecified: Secondary | ICD-10-CM | POA: Diagnosis not present

## 2021-11-01 DIAGNOSIS — I10 Essential (primary) hypertension: Secondary | ICD-10-CM | POA: Diagnosis not present

## 2021-11-01 DIAGNOSIS — Z96653 Presence of artificial knee joint, bilateral: Secondary | ICD-10-CM | POA: Diagnosis not present

## 2021-11-01 DIAGNOSIS — Z8249 Family history of ischemic heart disease and other diseases of the circulatory system: Secondary | ICD-10-CM | POA: Diagnosis not present

## 2021-11-01 DIAGNOSIS — Z823 Family history of stroke: Secondary | ICD-10-CM | POA: Diagnosis not present

## 2021-11-01 DIAGNOSIS — Z7982 Long term (current) use of aspirin: Secondary | ICD-10-CM | POA: Diagnosis not present

## 2021-11-09 DIAGNOSIS — M2021 Hallux rigidus, right foot: Secondary | ICD-10-CM | POA: Diagnosis not present

## 2021-12-21 DIAGNOSIS — Q825 Congenital non-neoplastic nevus: Secondary | ICD-10-CM | POA: Diagnosis not present

## 2021-12-21 DIAGNOSIS — D2271 Melanocytic nevi of right lower limb, including hip: Secondary | ICD-10-CM | POA: Diagnosis not present

## 2021-12-21 DIAGNOSIS — D225 Melanocytic nevi of trunk: Secondary | ICD-10-CM | POA: Diagnosis not present

## 2021-12-21 DIAGNOSIS — D2361 Other benign neoplasm of skin of right upper limb, including shoulder: Secondary | ICD-10-CM | POA: Diagnosis not present

## 2021-12-21 DIAGNOSIS — L821 Other seborrheic keratosis: Secondary | ICD-10-CM | POA: Diagnosis not present

## 2021-12-21 DIAGNOSIS — L720 Epidermal cyst: Secondary | ICD-10-CM | POA: Diagnosis not present

## 2021-12-21 DIAGNOSIS — L578 Other skin changes due to chronic exposure to nonionizing radiation: Secondary | ICD-10-CM | POA: Diagnosis not present

## 2021-12-21 DIAGNOSIS — L719 Rosacea, unspecified: Secondary | ICD-10-CM | POA: Diagnosis not present

## 2022-01-05 DIAGNOSIS — J310 Chronic rhinitis: Secondary | ICD-10-CM | POA: Diagnosis not present

## 2022-01-05 DIAGNOSIS — Z0182 Encounter for allergy testing: Secondary | ICD-10-CM | POA: Diagnosis not present

## 2022-02-28 DIAGNOSIS — L989 Disorder of the skin and subcutaneous tissue, unspecified: Secondary | ICD-10-CM | POA: Diagnosis not present

## 2022-02-28 DIAGNOSIS — L723 Sebaceous cyst: Secondary | ICD-10-CM | POA: Diagnosis not present

## 2022-02-28 DIAGNOSIS — L720 Epidermal cyst: Secondary | ICD-10-CM | POA: Diagnosis not present

## 2022-03-15 DIAGNOSIS — E559 Vitamin D deficiency, unspecified: Secondary | ICD-10-CM | POA: Diagnosis not present

## 2022-03-15 DIAGNOSIS — M85852 Other specified disorders of bone density and structure, left thigh: Secondary | ICD-10-CM | POA: Diagnosis not present

## 2022-03-15 DIAGNOSIS — Z0000 Encounter for general adult medical examination without abnormal findings: Secondary | ICD-10-CM | POA: Diagnosis not present

## 2022-03-15 DIAGNOSIS — N951 Menopausal and female climacteric states: Secondary | ICD-10-CM | POA: Diagnosis not present

## 2022-03-15 DIAGNOSIS — Z23 Encounter for immunization: Secondary | ICD-10-CM | POA: Diagnosis not present

## 2022-03-15 DIAGNOSIS — I10 Essential (primary) hypertension: Secondary | ICD-10-CM | POA: Diagnosis not present

## 2022-03-15 DIAGNOSIS — I700 Atherosclerosis of aorta: Secondary | ICD-10-CM | POA: Diagnosis not present

## 2022-03-17 ENCOUNTER — Telehealth: Payer: Self-pay

## 2022-03-17 NOTE — Telephone Encounter (Signed)
Pt LVM in triage line reporting procedure done by BS in 2018 w/ Dr. Sabra Heck assisting for cystocele/rectocele repair. Pt states that she is now completely incontinent (felt like bladder was full but not unusually full), cannot hold urine at all and is wondering what her next steps would be.   Pt notified that she hasn't been seen since 2021 and she also saw Dr. Sabra Heck for her AEXs so advised her she could see either physician since Dr. Sabra Heck was also present during her procedure in 2018 w/ Dr. Quincy Simmonds. However, pt reported that since Dr. Quincy Simmonds was the primary, she wanted to reach out to [place]. Provided pt w/ Dr. Sanjuan Dame new office contact # and advised pt will send msg to appt desk to contact her re: first available OV w/ Dr. Quincy Simmonds. Pt voiced understanding.

## 2022-03-20 NOTE — Progress Notes (Unsigned)
GYNECOLOGY  VISIT   HPI: 72 y.o.   Married  Caucasian  female   G2P2002 with Patient's last menstrual period was 02/20/1986 (approximate).   here for   urinary incontinence. Pt has noticed it two days in a row. She fully lost control of her bladder both times.  Once on a Thursday and then then following day.   No pain with urination.   Drinking a lot of water.   She took a couple of [employer], tea for stool softening around that time.   Doing fasting diet and drinking 2 cups of coffee during the fasting time.  Voids a lot in the am after drinking the coffee.  Status post Robotically assisted laparoscopic sacral colpopexy, anterior  and posterior colporrhaphy, TVT Exact midurethral sling and cystoscopy on 01/01/17.  States overall she had done amazingly well since surgery.  Voids well.  No leak of urine with cough, laugh.   GYNECOLOGIC HISTORY: Patient's last menstrual period was 02/20/1986 (approximate). Contraception:  hysterectomy Menopausal hormone therapy:  n/a Last mammogram:  08/04/21 Breast Density Category B, BI-RADS CATEGORY 1 Neg Last pap smear:   12/03/06 neg        OB History     Gravida  2   Para  2   Term  2   Preterm  0   AB  0   Living  2      SAB  0   IAB  0   Ectopic  0   Multiple  0   Live Births  2              Patient Active Problem List   Diagnosis Date Noted   Dizziness 08/30/2021   S/P right TKA 03/11/2019   Status post surgery 01/01/2017   S/P laparoscopic appendectomyOct 2017 12/10/2015   Acute appendicitis 12/10/2015   Overweight (BMI 25.0-29.9) 12/01/2015   S/P left TKA 11/30/2015   Migraine, unspecified, without mention of intractable migraine without mention of status migrainosus 09/02/2013   History of endometriosis 09/02/2013    Past Medical History:  Diagnosis Date   Arthritis    left knee   Bradycardia    COVID    Decreased hearing of left ear    Endometriosis    Family history of adverse reaction to  anesthesia    pt daughter very nauseous with anesthesia.   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 headache    Osteopenia    Otitis externa    Paresthesia    Recurrent sinus infections    last Spring 2017    Seasonal allergies    Syncope and collapse     Past Surgical History:  Procedure Laterality Date   ANTERIOR AND POSTERIOR REPAIR N/A 01/01/2017   Procedure: ANTERIOR (CYSTOCELE) AND POSTERIOR REPAIR (RECTOCELE);  Surgeon: Nunzio Cobbs, MD;  Location: [facility];  Service: Gynecology;  Laterality: N/A;   APPENDECTOMY     BLADDER SUSPENSION N/A 01/01/2017   Procedure: TRANSVAGINAL TAPE (TVT) PROCEDURE mid-urethral sling;  Surgeon: Nunzio Cobbs, MD;  Location: [facility];  Service: Gynecology;  Laterality: N/A;  CYSTOSCOPY N/A 01/01/2017   Procedure: CYSTOSCOPY;  Surgeon: Patton Salles, MD;  Location: [facility];  Service: Gynecology;  Laterality: N/A;   LAPAROSCOPIC APPENDECTOMY N/A 12/10/2015   Procedure: APPENDECTOMY LAPAROSCOPIC;  Surgeon: Luretha Murphy, MD;  Location: [facility];  Service: General;  Laterality: N/A;   NASAL SINUS SURGERY  2002   again in 2002, revision   ROBOTIC ASSISTED BILATERAL SALPINGO OOPHERECTOMY  01/01/2017   Procedure: ROBOTIC ASSISTED BILATERAL SALPINGO OOPHORECTOMY, Extensive Lysis of Adhesions;  Surgeon: Patton Salles, MD;  Location: [facility];  Service: Gynecology;;   ROBOTIC ASSISTED LAPAROSCOPIC SACROCOLPOPEXY N/A 01/01/2017   Procedure: ROBOTIC ASSISTED LAPAROSCOPIC SACROCOLPOPEXY;  Surgeon: Patton Salles, MD;  Location: [facility];  Service: Gynecology;  Laterality: N/A;   TOTAL ABDOMINAL HYSTERECTOMY  1998    DUB/endometriosis   TOTAL KNEE ARTHROPLASTY Left 11/30/2015   Procedure: LEFT TOTAL KNEE ARTHROPLASTY;  Surgeon: Durene Romans, MD;  Location: [facility];  Service: Orthopedics;  Laterality: Left;  Adductor Block   TOTAL KNEE ARTHROPLASTY Right 03/11/2019   Procedure: RIGHT TOTAL KNEE ARTHROPLASTY;  Surgeon: Durene Romans, MD;  Location: [facility];  Service: Orthopedics;  Laterality: Right;   WISDOM TOOTH EXTRACTION      Current Outpatient Medications  Medication Sig Dispense Refill   aspirin EC 81 MG tablet Take 1 tablet (81 mg total) by mouth daily. Swallow whole. 90 tablet 3   CALCIUM-VITAMIN D PO Take by mouth.     citalopram (CELEXA) 20 MG tablet Take 1 tablet (20 mg total) by mouth at bedtime. 90 tablet 4   clobetasol (TEMOVATE) 0.05 % external solution Apply 1 application topically at bedtime as needed (APPLY TO SCALP 1 TO 2 TIMES A DAY FOR FLARES).      fluticasone (FLONASE) 50 MCG/ACT nasal spray Place 1 spray into both nostrils daily.     gabapentin (NEURONTIN) 100 MG capsule Take 200 mg by mouth at bedtime.     loratadine (CLARITIN) 10 MG tablet Take 10 mg by mouth daily.     losartan (COZAAR) 50 MG tablet Take 50 mg by mouth daily.     metoprolol succinate (TOPROL XL) 25 MG 24 hr tablet Take 1 tablet (25 mg total) by mouth daily. 90 tablet 3   SUMAtriptan (IMITREX) 100 MG tablet Take 1 tablet (100 mg total) by mouth every 2 (two) hours as needed for migraine. 9 tablet 4   Vitamin D, Ergocalciferol, (DRISDOL) 1.25 MG (50000 UNIT) CAPS capsule Take by mouth.     rosuvastatin (CRESTOR) 10 MG tablet Take 10 mg by mouth once a week. (Patient not taking: Reported on 03/21/2022)     No current facility-administered medications for this visit.     ALLERGIES: Novocain [procaine]  Family History  Problem Relation Age of Onset   Osteoarthritis Mother    Heart attack Father    Miscarriages / [place] Sister    Transient ischemic attack Maternal Grandmother    Hypertension Maternal  Grandfather    Kidney disease Brother    Breast cancer Neg Hx     Social History   Socioeconomic History   Marital status: Married    Spouse name: Not on file   Number of children: Not on file   Years of education: Not on file   Highest education level: Not on file  Occupational History   Not on file  Tobacco Use   Smoking status: Never   Smokeless tobacco: Never  Vaping Use   Vaping  Use: Never used  Substance and Sexual Activity   Alcohol use: Yes    Alcohol/week: 5.0 standard drinks of alcohol    Types: 5 Glasses of wine per week   Drug use: No   Sexual activity: Not Currently    Partners: Male    Birth control/protection: Surgical    Comment: TAH  Other Topics Concern   Not on file  Social History Narrative   Not on file   Social Determinants of Health   Financial Resource Strain: Not on file  Food Insecurity: Not on file  Transportation Needs: Not on file  Physical Activity: Not on file  Stress: Not on file  Social Connections: Not on file  Intimate Partner Violence: Not on file    Review of Systems  See HPI.  PHYSICAL EXAMINATION:    BP 114/70 (BP Location: Left Arm, Patient Position: Sitting, Cuff Size: Normal)   Pulse (!) 59   Ht 5' 5.5" (1.664 m)   Wt 155 lb (70.3 kg)   LMP 02/20/1986 (Approximate)   SpO2 97%   BMI 25.40 kg/m     General appearance: alert, cooperative and appears stated age  Pelvic: External genitalia:  no lesions              Urethra:  normal appearing urethra with no masses, tenderness or lesions              Bartholins and Skenes: normal                 Vagina: normal appearing vagina with normal color and discharge, no lesions.  Excellent support.  No mesh exposure.               Cervix: absent                Bimanual Exam:  Uterus:  absent              Adnexa: no mass, fullness, tenderness              Rectal exam: yes.  Confirms.              Anus:  normal sphincter tone, no lesions  Chaperone was present for exam:   Raquel Sarna  ASSESSMENT  Status post Robotically assisted laparoscopic sacral colpopexy, anterior  and posterior colporrhaphy, TVT Exact midurethral sling and cystoscopy on 01/01/17. Urinary incontinence.  I suspect this is related to caffeine and large po in take of water.  Possible overactive bladder.   PLAN  Urinalysis and reflex culture.  We discussed bladder irritants.  She will reduce her caffeine intake and reduce water intake.  We discussed treatment options of medication and pelvic floor therapy for overactive bladder if her symptoms persist.  FU prn.    An After Visit Summary was printed and given to the patient.  24 min total time was spent for this patient encounter, including preparation, face-to-face counseling with the patient, coordination of care, and documentation of the encounter.

## 2022-03-21 ENCOUNTER — Encounter: Payer: Self-pay | Admitting: Obstetrics and Gynecology

## 2022-03-21 ENCOUNTER — Ambulatory Visit: Payer: Medicare PPO | Admitting: Obstetrics and Gynecology

## 2022-03-21 VITALS — BP 114/70 | HR 59 | Ht 65.5 in | Wt 155.0 lb

## 2022-03-21 DIAGNOSIS — N3941 Urge incontinence: Secondary | ICD-10-CM | POA: Diagnosis not present

## 2022-03-21 LAB — URINALYSIS, COMPLETE W/RFL CULTURE
Bacteria, UA: NONE SEEN /HPF
Bilirubin Urine: NEGATIVE
Casts: NONE SEEN /LPF
Crystals: NONE SEEN /HPF
Glucose, UA: NEGATIVE
Hgb urine dipstick: NEGATIVE
Hyaline Cast: NONE SEEN /LPF
Leukocyte Esterase: NEGATIVE
Nitrites, Initial: NEGATIVE
Protein, ur: NEGATIVE
RBC / HPF: NONE SEEN /HPF (ref 0–2)
Specific Gravity, Urine: 1.015 (ref 1.001–1.035)
WBC, UA: NONE SEEN /HPF (ref 0–5)
Yeast: NONE SEEN /HPF
pH: 7 (ref 5.0–8.0)

## 2022-03-21 LAB — NO CULTURE INDICATED

## 2022-03-21 NOTE — Telephone Encounter (Signed)
Pt seen today by BS @ 1000. Will close encounter.

## 2022-03-21 NOTE — Patient Instructions (Signed)

## 2022-05-09 DIAGNOSIS — H524 Presbyopia: Secondary | ICD-10-CM | POA: Diagnosis not present

## 2022-05-09 DIAGNOSIS — H04123 Dry eye syndrome of bilateral lacrimal glands: Secondary | ICD-10-CM | POA: Diagnosis not present

## 2022-05-09 DIAGNOSIS — H2513 Age-related nuclear cataract, bilateral: Secondary | ICD-10-CM | POA: Diagnosis not present

## 2022-05-09 DIAGNOSIS — H5213 Myopia, bilateral: Secondary | ICD-10-CM | POA: Diagnosis not present

## 2022-07-11 ENCOUNTER — Other Ambulatory Visit: Payer: Self-pay | Admitting: Family Medicine

## 2022-07-11 DIAGNOSIS — Z1231 Encounter for screening mammogram for malignant neoplasm of breast: Secondary | ICD-10-CM

## 2022-08-05 IMAGING — MG MM DIGITAL SCREENING BILAT W/ TOMO AND CAD
8 series · 8 of 24 positions shown · non-contrast
Comparison: Previous exam(s).

CLINICAL DATA: Screening.

EXAM:
DIGITAL SCREENING BILATERAL MAMMOGRAM WITH TOMOSYNTHESIS AND CAD
TECHNIQUE: Bilateral screening digital craniocaudal and mediolateral oblique
mammograms were obtained. Bilateral screening digital breast
tomosynthesis was performed. The images were evaluated with
computer-aided detection.

[R MLO synth-2D]
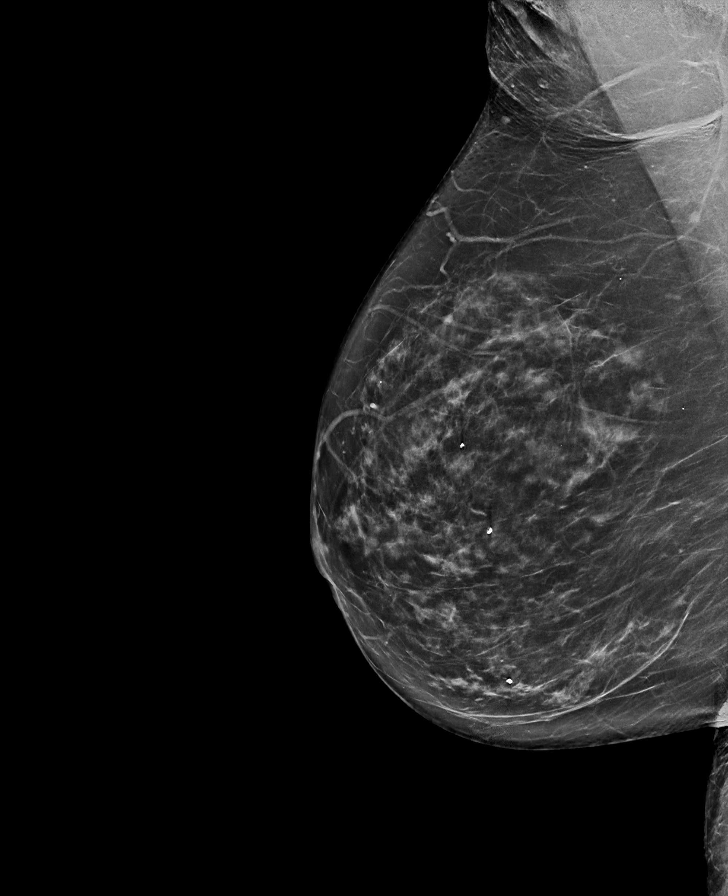

[L CC synth-2D]
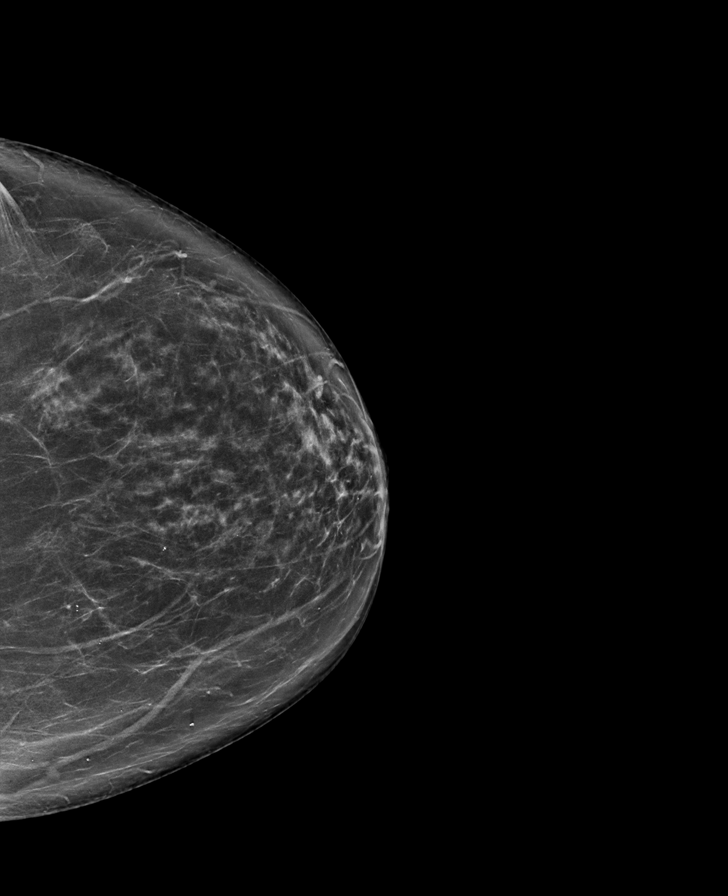

[R CC synth-2D]
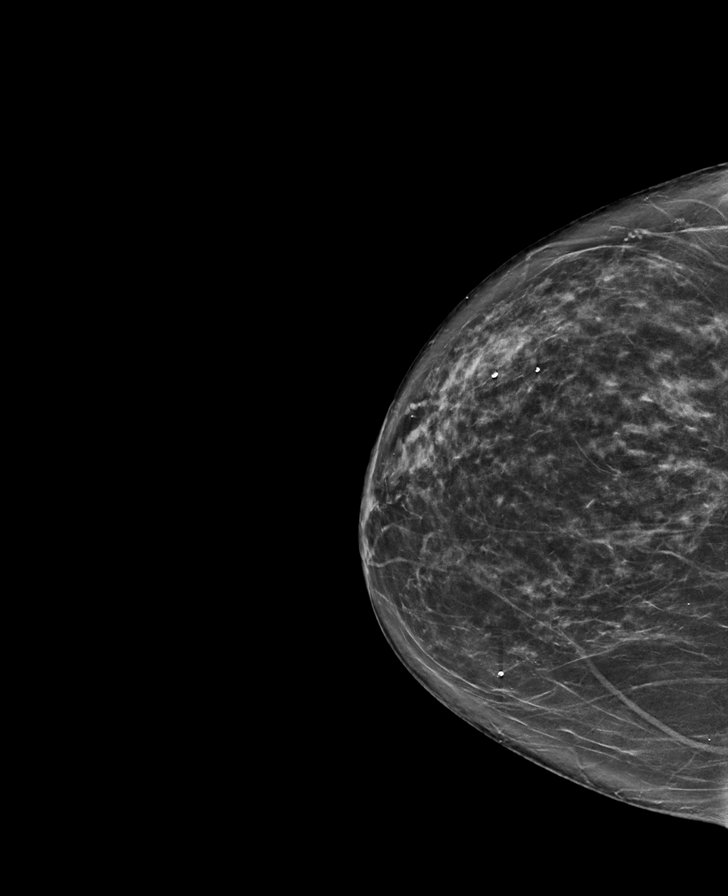

[L MLO synth-2D]
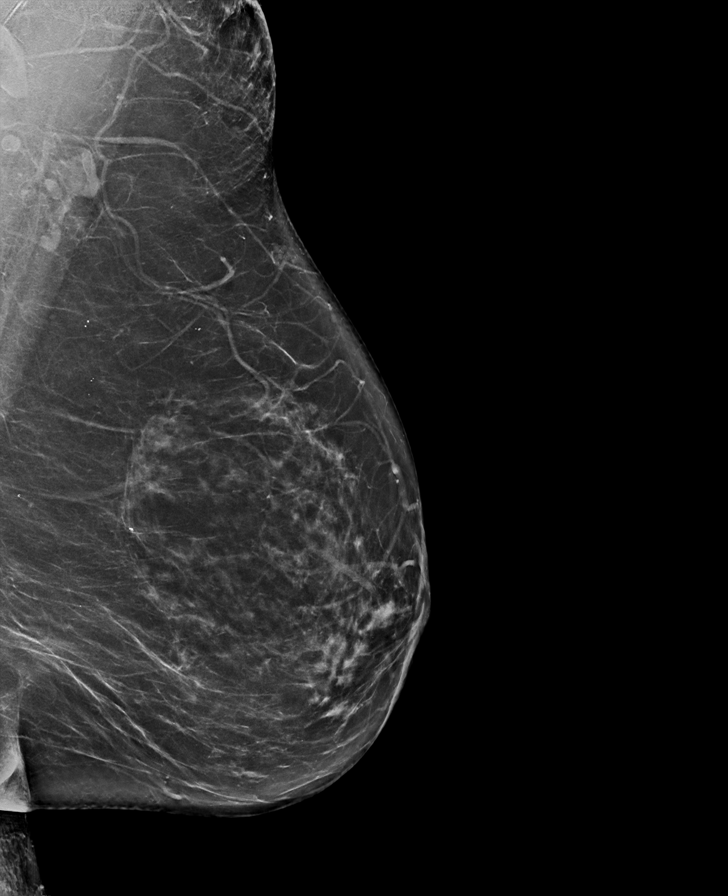

[L MLO tomo · tomo slice 43/84.0]
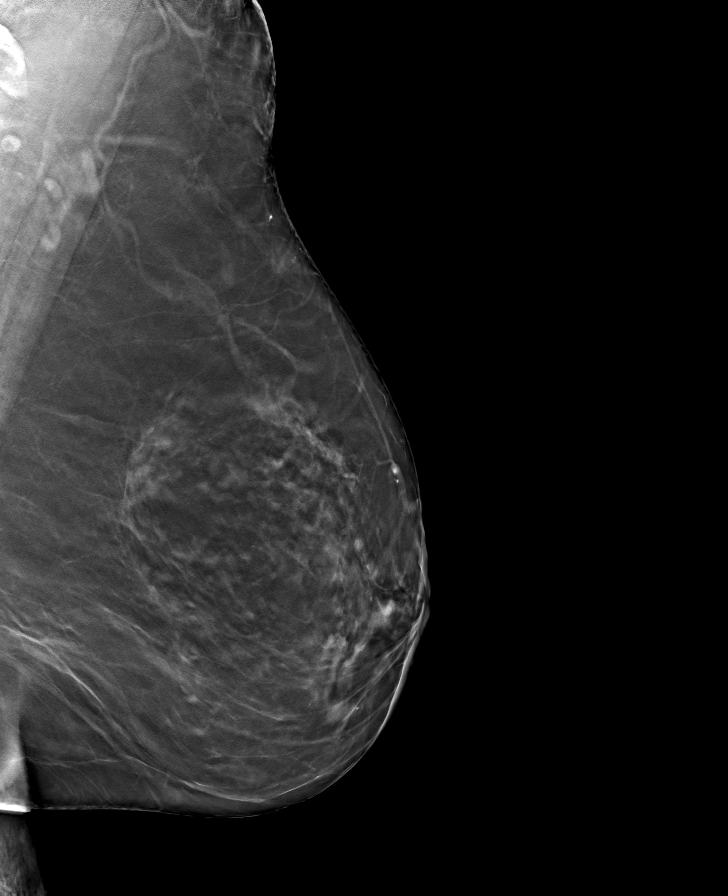

[R MLO tomo · tomo slice 41/81.0]
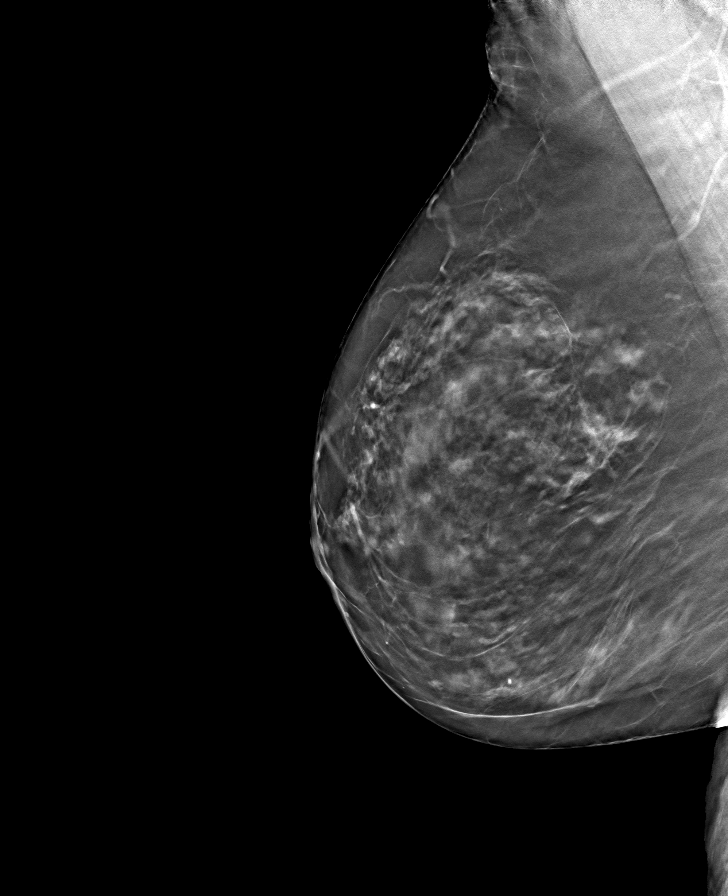

[L CC tomo · tomo slice 41/82.0]
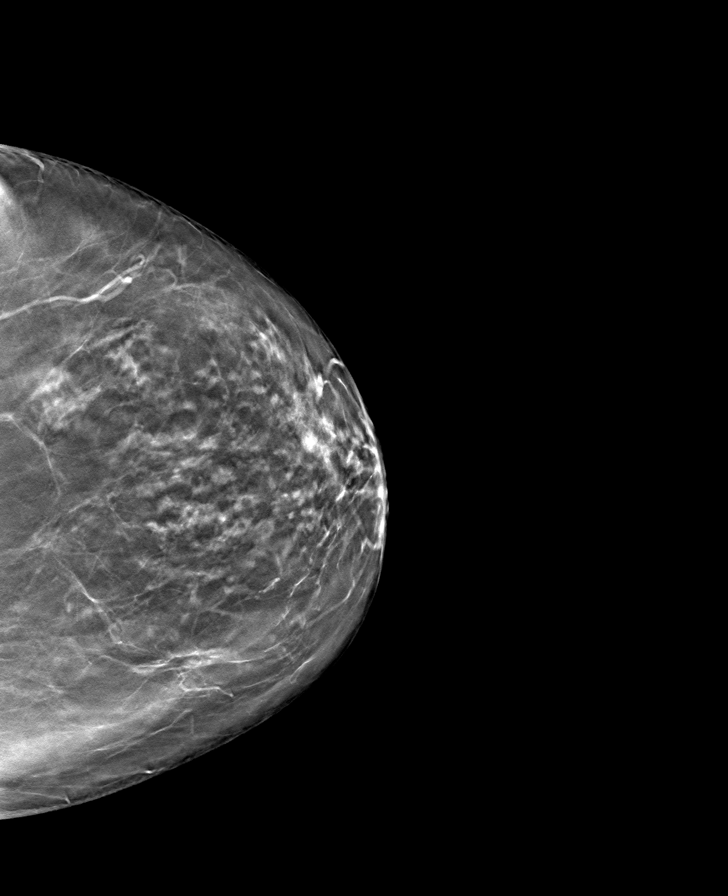

[R CC tomo · tomo slice 39/77.0]
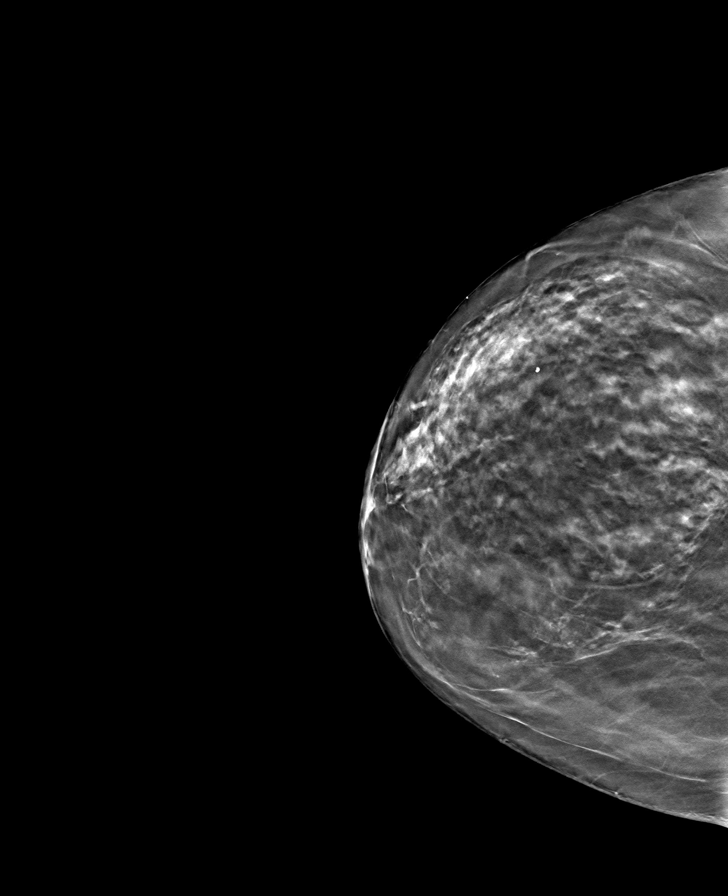

[8 of 24 positions shown; findings below may reference images not displayed]

ACR Breast Density Category b: There are scattered areas of
fibroglandular density.
FINDINGS: There are no findings suspicious for malignancy.
IMPRESSION: No mammographic evidence of malignancy. A result letter of this
screening mammogram will be mailed directly to the patient.

RECOMMENDATION:
Screening mammogram in one year. (Code:51-O-LD2)

BI-RADS CATEGORY  1: Negative.

## 2022-08-08 ENCOUNTER — Ambulatory Visit
Admission: RE | Admit: 2022-08-08 | Discharge: 2022-08-08 | Disposition: A | Discharge status: Home / Self Care | Payer: Medicare PPO | Source: Ambulatory Visit | Attending: Family Medicine | Admitting: Family Medicine

## 2022-08-08 DIAGNOSIS — Z1231 Encounter for screening mammogram for malignant neoplasm of breast: Secondary | ICD-10-CM

## 2022-08-08 DIAGNOSIS — I700 Atherosclerosis of aorta: Secondary | ICD-10-CM | POA: Diagnosis not present

## 2022-08-08 DIAGNOSIS — J301 Allergic rhinitis due to pollen: Secondary | ICD-10-CM | POA: Diagnosis not present

## 2022-08-08 DIAGNOSIS — Z825 Family history of asthma and other chronic lower respiratory diseases: Secondary | ICD-10-CM | POA: Diagnosis not present

## 2022-08-08 DIAGNOSIS — Q825 Congenital non-neoplastic nevus: Secondary | ICD-10-CM | POA: Diagnosis not present

## 2022-08-08 DIAGNOSIS — M8580 Other specified disorders of bone density and structure, unspecified site: Secondary | ICD-10-CM | POA: Diagnosis not present

## 2022-08-08 DIAGNOSIS — E785 Hyperlipidemia, unspecified: Secondary | ICD-10-CM | POA: Diagnosis not present

## 2022-08-08 DIAGNOSIS — I10 Essential (primary) hypertension: Secondary | ICD-10-CM | POA: Diagnosis not present

## 2022-08-08 DIAGNOSIS — N951 Menopausal and female climacteric states: Secondary | ICD-10-CM | POA: Diagnosis not present

## 2022-08-08 DIAGNOSIS — R001 Bradycardia, unspecified: Secondary | ICD-10-CM | POA: Diagnosis not present

## 2022-09-13 DIAGNOSIS — N951 Menopausal and female climacteric states: Secondary | ICD-10-CM | POA: Diagnosis not present

## 2022-09-13 DIAGNOSIS — I700 Atherosclerosis of aorta: Secondary | ICD-10-CM | POA: Diagnosis not present

## 2022-09-13 DIAGNOSIS — E7800 Pure hypercholesterolemia, unspecified: Secondary | ICD-10-CM | POA: Diagnosis not present

## 2022-09-13 DIAGNOSIS — I10 Essential (primary) hypertension: Secondary | ICD-10-CM | POA: Diagnosis not present

## 2022-09-13 DIAGNOSIS — M85852 Other specified disorders of bone density and structure, left thigh: Secondary | ICD-10-CM | POA: Diagnosis not present

## 2022-11-10 DIAGNOSIS — J0140 Acute pansinusitis, unspecified: Secondary | ICD-10-CM | POA: Diagnosis not present

## 2022-11-16 ENCOUNTER — Other Ambulatory Visit: Payer: Self-pay | Admitting: Internal Medicine

## 2022-12-12 ENCOUNTER — Other Ambulatory Visit: Payer: Self-pay | Admitting: Internal Medicine

## 2022-12-25 ENCOUNTER — Other Ambulatory Visit: Payer: Self-pay | Admitting: Internal Medicine

## 2022-12-29 DIAGNOSIS — L821 Other seborrheic keratosis: Secondary | ICD-10-CM | POA: Diagnosis not present

## 2022-12-29 DIAGNOSIS — L219 Seborrheic dermatitis, unspecified: Secondary | ICD-10-CM | POA: Diagnosis not present

## 2022-12-29 DIAGNOSIS — L578 Other skin changes due to chronic exposure to nonionizing radiation: Secondary | ICD-10-CM | POA: Diagnosis not present

## 2022-12-29 DIAGNOSIS — Q825 Congenital non-neoplastic nevus: Secondary | ICD-10-CM | POA: Diagnosis not present

## 2022-12-29 DIAGNOSIS — L719 Rosacea, unspecified: Secondary | ICD-10-CM | POA: Diagnosis not present

## 2022-12-29 DIAGNOSIS — D225 Melanocytic nevi of trunk: Secondary | ICD-10-CM | POA: Diagnosis not present

## 2022-12-29 DIAGNOSIS — L309 Dermatitis, unspecified: Secondary | ICD-10-CM | POA: Diagnosis not present

## 2022-12-29 DIAGNOSIS — D2271 Melanocytic nevi of right lower limb, including hip: Secondary | ICD-10-CM | POA: Diagnosis not present

## 2022-12-29 DIAGNOSIS — D2361 Other benign neoplasm of skin of right upper limb, including shoulder: Secondary | ICD-10-CM | POA: Diagnosis not present

## 2023-04-16 ENCOUNTER — Other Ambulatory Visit: Payer: Self-pay | Admitting: Family Medicine

## 2023-04-16 DIAGNOSIS — E559 Vitamin D deficiency, unspecified: Secondary | ICD-10-CM | POA: Diagnosis not present

## 2023-04-16 DIAGNOSIS — Z79899 Other long term (current) drug therapy: Secondary | ICD-10-CM | POA: Diagnosis not present

## 2023-04-16 DIAGNOSIS — I700 Atherosclerosis of aorta: Secondary | ICD-10-CM | POA: Diagnosis not present

## 2023-04-16 DIAGNOSIS — Z0000 Encounter for general adult medical examination without abnormal findings: Secondary | ICD-10-CM | POA: Diagnosis not present

## 2023-04-16 DIAGNOSIS — N951 Menopausal and female climacteric states: Secondary | ICD-10-CM | POA: Diagnosis not present

## 2023-04-16 DIAGNOSIS — M85852 Other specified disorders of bone density and structure, left thigh: Secondary | ICD-10-CM

## 2023-04-16 DIAGNOSIS — Z1231 Encounter for screening mammogram for malignant neoplasm of breast: Secondary | ICD-10-CM

## 2023-04-16 DIAGNOSIS — I10 Essential (primary) hypertension: Secondary | ICD-10-CM | POA: Diagnosis not present

## 2023-05-15 DIAGNOSIS — H2513 Age-related nuclear cataract, bilateral: Secondary | ICD-10-CM | POA: Diagnosis not present

## 2023-05-15 DIAGNOSIS — H5213 Myopia, bilateral: Secondary | ICD-10-CM | POA: Diagnosis not present

## 2023-06-25 ENCOUNTER — Other Ambulatory Visit: Payer: Self-pay | Admitting: Medical Genetics

## 2023-08-09 ENCOUNTER — Ambulatory Visit
Admission: RE | Admit: 2023-08-09 | Discharge: 2023-08-09 | Disposition: A | Discharge status: Home / Self Care | Payer: Medicare PPO | Source: Ambulatory Visit | Attending: Family Medicine | Admitting: Family Medicine

## 2023-08-09 DIAGNOSIS — Z1231 Encounter for screening mammogram for malignant neoplasm of breast: Secondary | ICD-10-CM | POA: Diagnosis not present

## 2023-08-16 ENCOUNTER — Other Ambulatory Visit (HOSPITAL_COMMUNITY)
Admission: RE | Admit: 2023-08-16 | Discharge: 2023-08-16 | Disposition: A | Discharge status: Home / Self Care | Payer: Self-pay | Source: Ambulatory Visit | Attending: Medical Genetics | Admitting: Medical Genetics

## 2023-08-31 LAB — GENECONNECT MOLECULAR SCREEN: Genetic Analysis Overall Interpretation: NEGATIVE

## 2023-10-12 DIAGNOSIS — I700 Atherosclerosis of aorta: Secondary | ICD-10-CM | POA: Diagnosis not present

## 2023-10-12 DIAGNOSIS — M85852 Other specified disorders of bone density and structure, left thigh: Secondary | ICD-10-CM | POA: Diagnosis not present

## 2023-10-12 DIAGNOSIS — E559 Vitamin D deficiency, unspecified: Secondary | ICD-10-CM | POA: Diagnosis not present

## 2023-10-12 DIAGNOSIS — I10 Essential (primary) hypertension: Secondary | ICD-10-CM | POA: Diagnosis not present

## 2023-10-12 DIAGNOSIS — E7800 Pure hypercholesterolemia, unspecified: Secondary | ICD-10-CM | POA: Diagnosis not present

## 2023-10-12 DIAGNOSIS — N951 Menopausal and female climacteric states: Secondary | ICD-10-CM | POA: Diagnosis not present

## 2023-10-17 DIAGNOSIS — M67441 Ganglion, right hand: Secondary | ICD-10-CM | POA: Diagnosis not present

## 2023-10-17 DIAGNOSIS — M19041 Primary osteoarthritis, right hand: Secondary | ICD-10-CM | POA: Diagnosis not present

## 2023-11-05 DIAGNOSIS — L710 Perioral dermatitis: Secondary | ICD-10-CM | POA: Diagnosis not present

## 2023-11-19 DIAGNOSIS — M79671 Pain in right foot: Secondary | ICD-10-CM | POA: Diagnosis not present

## 2023-12-11 ENCOUNTER — Other Ambulatory Visit: Payer: Medicare PPO

## 2023-12-31 ENCOUNTER — Ambulatory Visit (HOSPITAL_BASED_OUTPATIENT_CLINIC_OR_DEPARTMENT_OTHER)
Admission: RE | Admit: 2023-12-31 | Discharge: 2023-12-31 | Disposition: A | Discharge status: Home / Self Care | Source: Ambulatory Visit | Attending: Family Medicine | Admitting: Family Medicine

## 2023-12-31 DIAGNOSIS — Z780 Asymptomatic menopausal state: Secondary | ICD-10-CM | POA: Diagnosis not present

## 2023-12-31 DIAGNOSIS — M85852 Other specified disorders of bone density and structure, left thigh: Secondary | ICD-10-CM | POA: Insufficient documentation

## 2023-12-31 DIAGNOSIS — M810 Age-related osteoporosis without current pathological fracture: Secondary | ICD-10-CM | POA: Diagnosis not present

## 2024-01-09 DIAGNOSIS — L578 Other skin changes due to chronic exposure to nonionizing radiation: Secondary | ICD-10-CM | POA: Diagnosis not present

## 2024-01-09 DIAGNOSIS — D2361 Other benign neoplasm of skin of right upper limb, including shoulder: Secondary | ICD-10-CM | POA: Diagnosis not present

## 2024-01-09 DIAGNOSIS — D225 Melanocytic nevi of trunk: Secondary | ICD-10-CM | POA: Diagnosis not present

## 2024-01-09 DIAGNOSIS — L719 Rosacea, unspecified: Secondary | ICD-10-CM | POA: Diagnosis not present

## 2024-01-09 DIAGNOSIS — Q825 Congenital non-neoplastic nevus: Secondary | ICD-10-CM | POA: Diagnosis not present

## 2024-01-09 DIAGNOSIS — D2271 Melanocytic nevi of right lower limb, including hip: Secondary | ICD-10-CM | POA: Diagnosis not present

## 2024-01-09 DIAGNOSIS — L570 Actinic keratosis: Secondary | ICD-10-CM | POA: Diagnosis not present

## 2024-01-09 DIAGNOSIS — L219 Seborrheic dermatitis, unspecified: Secondary | ICD-10-CM | POA: Diagnosis not present

## 2024-01-09 DIAGNOSIS — L821 Other seborrheic keratosis: Secondary | ICD-10-CM | POA: Diagnosis not present

## 2024-01-25 DIAGNOSIS — M79672 Pain in left foot: Secondary | ICD-10-CM | POA: Diagnosis not present
